# Patient Record
Sex: Male | Born: 1955 | Race: White | Hispanic: No | Marital: Married | Smoking: Former smoker
Health system: Southern US, Community
[De-identification: ages and names within clinical notes are randomized; demographics above are authoritative.]

## PROBLEM LIST (undated history)

## (undated) DIAGNOSIS — T7840XA Allergy, unspecified, initial encounter: Secondary | ICD-10-CM

## (undated) DIAGNOSIS — K219 Gastro-esophageal reflux disease without esophagitis: Secondary | ICD-10-CM

## (undated) DIAGNOSIS — K649 Unspecified hemorrhoids: Secondary | ICD-10-CM

## (undated) DIAGNOSIS — I10 Essential (primary) hypertension: Secondary | ICD-10-CM

## (undated) DIAGNOSIS — Z87442 Personal history of urinary calculi: Secondary | ICD-10-CM

## (undated) DIAGNOSIS — N210 Calculus in bladder: Secondary | ICD-10-CM

## (undated) HISTORY — DX: Unspecified hemorrhoids: K64.9

## (undated) HISTORY — PX: TONSILLECTOMY: SUR1361

## (undated) HISTORY — DX: Allergy, unspecified, initial encounter: T78.40XA

## (undated) HISTORY — PX: HAND SURGERY: SHX662

---

## 2004-08-20 ENCOUNTER — Emergency Department: Payer: Self-pay | Admitting: Emergency Medicine

## 2004-11-10 ENCOUNTER — Encounter: Admission: RE | Admit: 2004-11-10 | Discharge: 2005-02-08 | Payer: Self-pay | Admitting: Occupational Medicine

## 2004-12-09 ENCOUNTER — Ambulatory Visit (HOSPITAL_COMMUNITY): Admission: RE | Admit: 2004-12-09 | Discharge: 2004-12-09 | Payer: Self-pay | Admitting: Orthopedic Surgery

## 2005-01-08 ENCOUNTER — Emergency Department: Payer: Self-pay | Admitting: Emergency Medicine

## 2005-06-07 ENCOUNTER — Emergency Department (HOSPITAL_COMMUNITY): Admission: EM | Admit: 2005-06-07 | Discharge: 2005-06-07 | Payer: Self-pay | Admitting: Family Medicine

## 2006-01-14 ENCOUNTER — Emergency Department (HOSPITAL_COMMUNITY): Admission: EM | Admit: 2006-01-14 | Discharge: 2006-01-14 | Payer: Self-pay | Admitting: Emergency Medicine

## 2007-03-29 ENCOUNTER — Emergency Department: Payer: Self-pay | Admitting: Emergency Medicine

## 2007-04-08 ENCOUNTER — Emergency Department: Payer: Self-pay | Admitting: Emergency Medicine

## 2007-04-10 ENCOUNTER — Emergency Department: Payer: Self-pay | Admitting: Emergency Medicine

## 2007-04-10 ENCOUNTER — Other Ambulatory Visit: Payer: Self-pay

## 2007-04-17 ENCOUNTER — Emergency Department (HOSPITAL_COMMUNITY): Admission: EM | Admit: 2007-04-17 | Discharge: 2007-04-17 | Payer: Self-pay | Admitting: Emergency Medicine

## 2010-06-23 NOTE — Consult Note (Signed)
NAME:  Jonathan Andrews, PAPESH NO.:  [id number]   MEDICAL RECORD NO.:  [id number]          PATIENT TYPE:  EMS   LOCATION:  MAJO                         FACILITY:  [facility]   PHYSICIAN:  Artist Pais. Mina Marble, M.D.DATE OF BIRTH:  24-Apr-1955   DATE OF CONSULTATION:  04/17/2007  DATE OF DISCHARGE:                                 CONSULTATION   PHYSICIAN REQUESTING CONSULTATION:  Carleene Cooper, M.D.   [name] presents today status post a fall.  He is 55 years old, right-  hand dominant.  He presents today with inability to actively extend the  metacarpophalangeal joint of the dominant right index finger.  He is an  otherwise-healthy 55 year old male.   No known drug allergies.   No current medication, recent hospitalizations or surgery.  He does say,  however, about 3 or 4 months ago he cut just proximal to the  metacarpophalangeal joint and has had weakness in extension since that  point in time.  He fell today.   EXAMINATION:  A well-nourished male, pleasant, alert and oriented x3.  Examination of his hand on the left reveals loss of active extension of  the metacarpophalangeal joint.  He can actively flex but extension  beyond 30 degrees is nonexistent.  He had some pain and swelling  proximal to the metacarpophalangeal phalangeal joint.   X-RAYS:  Deferred.   IMPRESSION:  We have a 55 year old man with a ruptured extensor tendon  in the nondominant left index finger.  He is placed in the appropriate  splint and will follow up in my office tomorrow.  We will reexamine him.  If his lack of extension persists, we will explore this and repair his  ruptured tendon.      Artist Pais Mina Marble, M.D.  Electronically Signed     MAW/MEDQ  D:  04/17/2007  T:  04/18/2007  Job:  [id number]

## 2010-12-30 ENCOUNTER — Ambulatory Visit: Payer: Self-pay | Admitting: Gastroenterology

## 2012-01-26 DIAGNOSIS — R972 Elevated prostate specific antigen [PSA]: Secondary | ICD-10-CM | POA: Insufficient documentation

## 2012-06-10 ENCOUNTER — Emergency Department: Payer: Self-pay | Admitting: Emergency Medicine

## 2012-06-10 LAB — URINALYSIS, COMPLETE
Bilirubin,UR: NEGATIVE
Glucose,UR: NEGATIVE mg/dL (ref 0–75)
Ketone: NEGATIVE
Leukocyte Esterase: NEGATIVE
Nitrite: NEGATIVE
Ph: 8 (ref 4.5–8.0)
Protein: 30
RBC,UR: 154 /HPF (ref 0–5)
Specific Gravity: 1.016 (ref 1.003–1.030)
Squamous Epithelial: NONE SEEN
WBC UR: 4 /HPF (ref 0–5)

## 2012-06-10 LAB — CBC
HCT: 44 % (ref 40.0–52.0)
HGB: 15.2 g/dL (ref 13.0–18.0)
MCH: 33.1 pg (ref 26.0–34.0)
MCHC: 34.4 g/dL (ref 32.0–36.0)
MCV: 96 fL (ref 80–100)
Platelet: 202 10*3/uL (ref 150–440)
RBC: 4.58 10*6/uL (ref 4.40–5.90)
RDW: 12.5 % (ref 11.5–14.5)
WBC: 7.3 10*3/uL (ref 3.8–10.6)

## 2012-06-10 LAB — BASIC METABOLIC PANEL
Anion Gap: 6 — ABNORMAL LOW (ref 7–16)
BUN: 20 mg/dL — ABNORMAL HIGH (ref 7–18)
Calcium, Total: 9.8 mg/dL (ref 8.5–10.1)
Chloride: 106 mmol/L (ref 98–107)
Co2: 27 mmol/L (ref 21–32)
Creatinine: 1.55 mg/dL — ABNORMAL HIGH (ref 0.60–1.30)
EGFR (African American): 58 — ABNORMAL LOW
EGFR (Non-African Amer.): 50 — ABNORMAL LOW
Glucose: 112 mg/dL — ABNORMAL HIGH (ref 65–99)
Osmolality: 281 (ref 275–301)
Potassium: 4 mmol/L (ref 3.5–5.1)
Sodium: 139 mmol/L (ref 136–145)

## 2012-07-31 ENCOUNTER — Ambulatory Visit: Payer: Self-pay | Admitting: Urology

## 2012-08-18 ENCOUNTER — Ambulatory Visit: Payer: Self-pay | Admitting: Urology

## 2012-12-23 ENCOUNTER — Emergency Department: Payer: Self-pay | Admitting: Emergency Medicine

## 2013-01-11 ENCOUNTER — Ambulatory Visit: Payer: Self-pay | Admitting: Orthopedic Surgery

## 2013-02-16 ENCOUNTER — Ambulatory Visit: Payer: Self-pay | Admitting: Urology

## 2013-07-30 DIAGNOSIS — Z87442 Personal history of urinary calculi: Secondary | ICD-10-CM | POA: Insufficient documentation

## 2014-05-31 NOTE — Op Note (Signed)
PATIENT NAME:  STYLIANOS, STRADLING MR#:  [id number] DATE OF BIRTH:  1955/03/18  DATE OF PROCEDURE:  01/11/2013  PREOPERATIVE DIAGNOSIS: Left fifth metacarpal fracture, displaced.   POSTOPERATIVE DIAGNOSIS:  Left fifth metacarpal fracture, displaced.   PROCEDURE: Closed reduction pinning, right fifth metacarpal.   ANESTHESIA: General.   SURGEON: Hessie Knows, M.D.   DESCRIPTION OF PROCEDURE: The patient was brought to the Operating Room and after adequate anesthesia was obtained, the right arm was prepped and draped in the usual sterile fashion with a tourniquet applied to the upper arm. After patient identification, timeout procedures were completed, closed manipulation was carried out with the rotation easily corrected. The apex of dorsal angulation was partially corrected. Clinically the hand had good appearance and it was felt that acceptable closed reduction  had been obtained. Two K wires were then inserted, one obliquely through the metacarpal head into the fourth metacarpal. A second more across the more proximal portion of the neck again into the fourth metacarpal, under fluoroscopy the fracture appeared stable. The rotation appeared to be correct on comparison to the other hand with un-scrubbed assistant flexing the fingers. There is no longer abnormal overlap of the fingers. The pins were then cut short and bent over. Xeroform was placed around the base of the pins and a volar splint was applied along 4 x 4's. The patient was sent to the recovery room in stable condition.   ESTIMATED BLOOD LOSS: Minimal.   COMPLICATIONS: None.   SPECIMEN: None.   IMPLANTS: K wire x 2.   ____________________________ Laurene Footman, MD mjm:cc D: 01/11/2013 20:53:47 ET T: 01/11/2013 23:26:11 ET JOB#: [id number]  cc: Laurene Footman, MD, <Dictator> Laurene Footman MD ELECTRONICALLY SIGNED 01/12/2013 1:36

## 2014-08-14 IMAGING — CR RIGHT HAND - 2 VIEW
1 series · 1 of 1 positions shown · non-contrast
Comparison: 12/23/2012

CLINICAL DATA: Postop evaluation

EXAM:
RIGHT HAND - 2 VIEW

[lat]
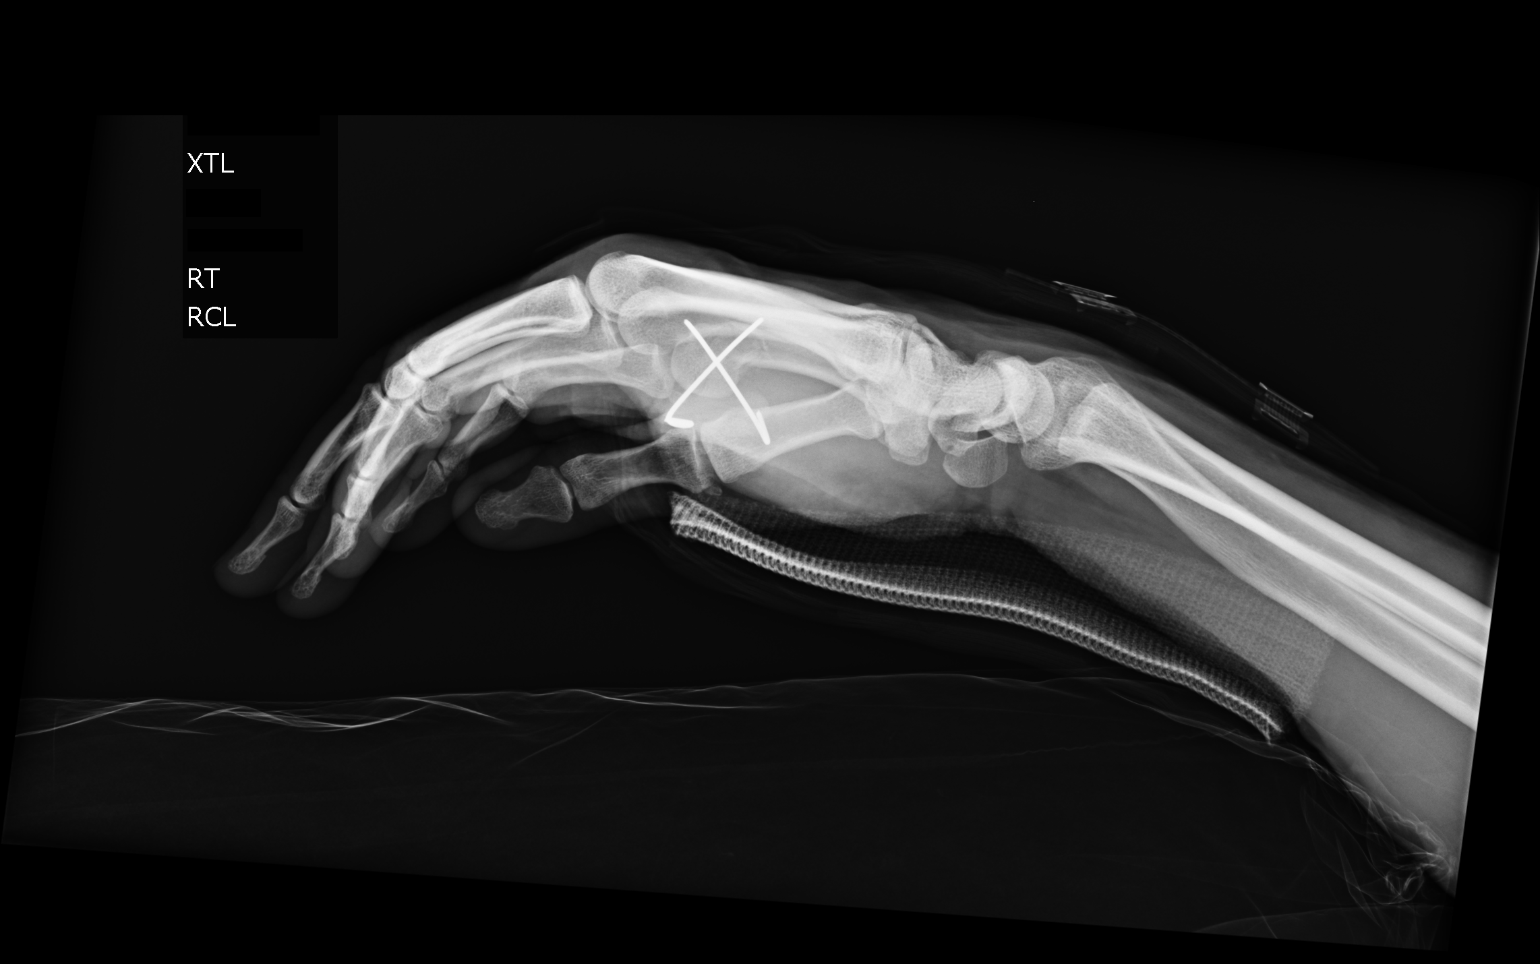

[1 of 1 positions shown; findings below may reference images not displayed]

FINDINGS: Patient status post pinning of the distal 5th metacarpal fracture.
Hardware appears intact without evidence of loosening or failure.
Study is degraded by overlying casting material.
IMPRESSION: Patient is status post open reduction with pending distal 5th
metacarpal fracture.

## 2015-06-30 ENCOUNTER — Ambulatory Visit (INDEPENDENT_AMBULATORY_CARE_PROVIDER_SITE_OTHER): Payer: 59 | Admitting: General Surgery

## 2015-06-30 ENCOUNTER — Encounter: Payer: Self-pay | Admitting: General Surgery

## 2015-06-30 VITALS — BP 122/62 | HR 80 | Resp 12 | Ht 76.0 in | Wt 216.0 lb

## 2015-06-30 DIAGNOSIS — K648 Other hemorrhoids: Secondary | ICD-10-CM | POA: Diagnosis not present

## 2015-06-30 MED ORDER — HYDROCORTISONE ACETATE 25 MG RE SUPP: 25.0000 mg | Freq: Two times a day (BID) | RECTAL | Status: DC

## 2015-06-30 NOTE — Progress Notes (Signed)
Patient ID: Jonathan Blackwater., male   DOB: 10-12-1955, 60 y.o.   MRN: [id number]  Chief Complaint  Patient presents with  . Other    bleeding hemorrhoids    HPI Jonathan E Ranbir Yohey. is a 60 y.o. male here for evaluation of bleeding hemorrhoids. Patient states he has had problems over the last 10 years. He states he has noticed dark red blood on the paper and in the bowl.He has had to make use of a sanitary pad at times. Has used all different kinds of external creams, but has not use suppositories. He states this present episode had the most bleeding that he is experienced. He has had no severe pain since suggesting anal fissure. The patient was recently placed on steroids, and last to-3 days he is reported no bleeding and less perianal discomfort.  The patient normally moves his bowels daily. Occasionally he will have hard stools.  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 These may way up 80 pounds.  The patient is accompanied today by his wife of five years, Jonathan Andrews,  who was present for the interview and exam.  I personally reviewed the patient's history.   HPI  Past Medical History  Diagnosis Date  . Hemorrhoids     Past Surgical History  Procedure Laterality Date  . Hand surgery      No family history on file.  Social History Social History  Substance Use Topics  . Smoking status: Never Smoker   . Smokeless tobacco: Not on file  . Alcohol Use: No    Allergies  Allergen Reactions  . Penicillins Hives    Current Outpatient Prescriptions  Medication Sig Dispense Refill  . hydrocortisone (ANUSOL-HC) 2.5 % rectal cream Apply topically.    . lidocaine (XYLOCAINE) 2 % jelly Apply topically.    . Multiple Vitamin (MULTIVITAMIN) capsule Take by mouth.    . predniSONE (STERAPRED UNI-PAK 21 TAB) 10 MG (21) TBPK tablet     . hydrocortisone (ANUSOL-HC) 25 MG suppository  Place 1 suppository (25 mg total) rectally 2 (two) times daily. 12 suppository 0   No current facility-administered medications for this visit.    Review of Systems Review of Systems  Constitutional: Negative.   Respiratory: Negative.   Cardiovascular: Negative.   Gastrointestinal: Positive for anal bleeding.    Blood pressure 122/62, pulse 80, resp. rate 12, height 6\' 4"  (1.93 m), weight 216 lb (97.977 kg).  Physical Exam Physical Exam  Constitutional: He is oriented to person, place, and time. He appears well-developed and well-nourished.  Eyes: Conjunctivae are normal. No scleral icterus.  Neck: Neck supple.  Cardiovascular: Normal rate, regular rhythm and normal heart sounds.   Pulmonary/Chest: Effort normal and breath sounds normal.  Abdominal: Soft. Normal appearance and bowel sounds are normal. There is no tenderness.  Genitourinary: Rectal exam shows internal hemorrhoid. Rectal exam shows no external hemorrhoid.     External anal exam showed no significant hemorrhoidal disease. No prolapsing tissue with straining in the lateral decubitus position.  Anoscopy showed a large 2 cm nonbleeding internal hemorrhoid on the right anterior wall. Remaining lower rectal exam unremarkable.  Lymphadenopathy:    He has no cervical adenopathy.  Neurological: He is alert and oriented to person, place, and time.  Skin: Skin is warm and dry.  Psychiatric: He has a normal mood and affect.    Data Reviewed  PCP notes of 06/26/2015  reviewed. External hemorrhoids reported with some bleeding. Annotation that this was the worst case it ever seen. Patient was provided with prednisone, Percocet and hydrocortisone cream.   Assessment  Significant hemorrhoidal disease, markedly improved with recent short course of steroids.    Plan    Options for management were reviewed: 1) With marked improved in symptoms and last 2 days continued observation with the initiation of Anusol HC suppositories  to continue to strengthen the generous internal hemorrhoid noted on anoscopy versus 2) elective hemorrhoidectomy.  At this time the patient has been a take a wait-and-see approach. A prescription for Anusol HC suppositories was sent to his pharmacy. Adding fiber to his diet daily to minimize episodic hard stools was encouraged.  If he has recurrent bleeding hemorrhoidectomy would be recommended (the visualized internal hemorrhoid noted today is too large for banding).   If the patient continues to to do well, follow up will be on an as-needed basis.   PCP: [place] This has been scribed by Lesly Rubenstein LPN   Robert Bellow 06/30/2015, 7:50 PM

## 2015-09-18 ENCOUNTER — Ambulatory Visit (INDEPENDENT_AMBULATORY_CARE_PROVIDER_SITE_OTHER): Payer: 59 | Admitting: Urology

## 2015-09-18 ENCOUNTER — Encounter: Payer: Self-pay | Admitting: Urology

## 2015-09-18 VITALS — BP 123/87 | HR 62 | Ht 76.0 in | Wt 216.1 lb

## 2015-09-18 DIAGNOSIS — K219 Gastro-esophageal reflux disease without esophagitis: Secondary | ICD-10-CM | POA: Insufficient documentation

## 2015-09-18 DIAGNOSIS — Z3009 Encounter for other general counseling and advice on contraception: Secondary | ICD-10-CM

## 2015-09-18 MED ORDER — DIAZEPAM 10 MG PO TABS: 10.0000 mg | ORAL_TABLET | Freq: Once | ORAL | 0 refills | Status: AC

## 2015-09-18 NOTE — Progress Notes (Signed)
09/18/2015 8:53 AM   Jonathan Kind Jr. [birth year] [id number]  Referring provider: Luna Glasgow, DO [facility] [place], [place] [address]  Chief Complaint  Patient presents with  . New Patient (Initial Visit)    HPI: The patient is a 60 year old male presents today to discuss vasectomy. Between he and his wife from previous marriages, they have a total of 3 children. They desire no more. Denies any problems at this time. There is no history of hematuria. He does have a past medical history of nephrolithiasis.   PMH: Past Medical History:  Diagnosis Date  . Hemorrhoids   . Kidney stone     Surgical History: Past Surgical History:  Procedure Laterality Date  . HAND SURGERY    . HAND SURGERY Bilateral     Home Medications:    Medication List       Accurate as of 09/18/15  8:53 AM. Always use your most recent med list.          diazepam 10 MG tablet Commonly known as:  VALIUM Take 1 tablet (10 mg total) by mouth once.   hydrocortisone 25 MG suppository Commonly known as:  ANUSOL-HC Place 1 suppository (25 mg total) rectally 2 (two) times daily.   lidocaine 2 % jelly Commonly known as:  XYLOCAINE Apply topically.   multivitamin capsule Take by mouth.   predniSONE 10 MG (21) Tbpk tablet Commonly known as:  STERAPRED UNI-PAK 21 TAB       Allergies:  Allergies  Allergen Reactions  . Penicillins Hives    Family History: Family History  Problem Relation Age of Onset  . Prostate cancer Father   . Bladder Cancer Neg Hx   . Kidney cancer Neg Hx     Social History:  reports that he has quit smoking. His smoking use included Cigarettes. He has never used smokeless tobacco. He reports that he drinks alcohol. He reports that he does not use drugs.  ROS: UROLOGY Frequent Urination?: No Hard to postpone urination?: No Burning/pain with urination?: No Get up at night to urinate?: No Leakage of urine?:  No Urine stream starts and stops?: No Trouble starting stream?: No Do you have to strain to urinate?: No Blood in urine?: No Urinary tract infection?: No Sexually transmitted disease?: No Injury to kidneys or bladder?: No Painful intercourse?: No Weak stream?: No Erection problems?: No Penile pain?: No  Gastrointestinal Nausea?: No Vomiting?: No Indigestion/heartburn?: No Diarrhea?: No Constipation?: No  Constitutional Fever: No Night sweats?: No Weight loss?: No Fatigue?: No  Skin Skin rash/lesions?: No Itching?: No  Eyes Blurred vision?: No Double vision?: No  Ears/Nose/Throat Sore throat?: No Sinus problems?: No  Hematologic/Lymphatic Swollen glands?: No Easy bruising?: No  Cardiovascular Leg swelling?: No Chest pain?: No  Respiratory Cough?: No Shortness of breath?: No  Endocrine Excessive thirst?: No  Musculoskeletal Back pain?: Yes Joint pain?: No  Neurological Headaches?: No Dizziness?: No  Psychologic Depression?: No Anxiety?: No  Physical Exam: BP 123/87 (BP Location: Left Arm, Patient Position: Sitting, Cuff Size: Normal)   Pulse 62   Ht 6\' 4"  (1.93 m)   Wt 216 lb 1.6 oz (98 kg)   BMI 26.30 kg/m   Constitutional:  Alert and oriented, No acute distress. HEENT: [place] AT, moist mucus membranes.  Trachea midline, no masses. Cardiovascular: No clubbing, cyanosis, or edema. Respiratory: Normal respiratory effort, no increased work of breathing. GI: Abdomen is soft, nontender, nondistended, no abdominal masses GU: No CVA  tenderness. Normal phallus. Testicles equally bilaterally. Bilateral vas deferens easily palpable. Skin: No rashes, bruises or suspicious lesions. Lymph: No cervical or inguinal adenopathy. Neurologic: Grossly intact, no focal deficits, moving all 4 extremities. Psychiatric: Normal mood and affect.  Laboratory Data: Lab Results  Component Value Date   WBC 7.3 06/10/2012   HGB 15.2 06/10/2012   HCT 44.0 06/10/2012    MCV 96 06/10/2012   PLT 202 06/10/2012    Lab Results  Component Value Date   CREATININE 1.55 (H) 06/10/2012    No results found for: PSA  No results found for: TESTOSTERONE  No results found for: HGBA1C  Urinalysis    Component Value Date/Time   COLORURINE Yellow 06/10/2012 2152   APPEARANCEUR Hazy 06/10/2012 2152   LABSPEC 1.016 06/10/2012 2152   PHURINE 8.0 06/10/2012 2152   GLUCOSEU Negative 06/10/2012 2152   HGBUR 2+ 06/10/2012 2152   BILIRUBINUR Negative 06/10/2012 2152   KETONESUR Negative 06/10/2012 2152   PROTEINUR 30 mg/dL 06/10/2012 2152   NITRITE Negative 06/10/2012 2152   LEUKOCYTESUR Negative 06/10/2012 2152     Assessment & Plan:    Today, we discussed what the vas deferens is, where it is located, and its function. We reviewed the procedure for vasectomy, it's risks, benefits, alternatives, and likelihood of achieving his goals. We discussed in detail the procedure, complications, and recovery as well as the need for clearance prior to unprotected intercourse. We discussed that vasectomy does not protect against sexually transmitted diseases. We discussed that this procedure does not result in immediate sterility and that they would need to use other forms of birth control until he has been cleared with negative postvasectomy semen analyses. I explained that the procedure is considered to be permanent and that attempts at reversal have varying degrees of success. These options include vasectomy reversal, sperm retrieval, and in vitro fertilization; these can be very expensive. We discussed the chance of postvasectomy pain syndrome which occurs in less than 5% of patients. I explained to the patient that there is no treatment to resolve this chronic pain, and that if it developed I would not be able to help resolve the issue, but that surgery is generally not needed for correction. I explained there have even been reports of systemic like illness associated with  this chronic pain, and that there was no good cure. I explained that vasectomy it is not a 100% reliable form of birth control, and the risk of pregnancy after vasectomy is approximately 1 in 2000 men who had a negative postvasectomy semen analysis or rare non-motile sperm. I explained that repeat vasectomy was necessary in less than 1% of vasectomy procedures when employing the type of technique that I use. I explained that he should refrain from ejaculation for approximately one week following vasectomy. I explained that there are other options for birth control which are permanent and non-permanent; we discussed these. I explained the rates of surgical complications, such as symptomatic hematoma or infection, are low (1-2%) and vary with the surgeon's experience and criteria used to diagnose the complication.  The patient had the opportunity to ask questions to his stated satisfaction. He voiced understanding of the above factors and stated that he has read all the information provided to him and the packets and informed consent  1. Family planning -vasectomy   Return for vasectomy.  Nickie Retort, MD  [facility] [address], [place], [place] [address] [phone]

## 2015-10-20 ENCOUNTER — Ambulatory Visit (INDEPENDENT_AMBULATORY_CARE_PROVIDER_SITE_OTHER): Payer: 59 | Admitting: Urology

## 2015-10-20 ENCOUNTER — Encounter: Payer: Self-pay | Admitting: Urology

## 2015-10-20 VITALS — Ht 76.0 in | Wt 219.4 lb

## 2015-10-20 DIAGNOSIS — Z3009 Encounter for other general counseling and advice on contraception: Secondary | ICD-10-CM

## 2015-10-20 DIAGNOSIS — Z309 Encounter for contraceptive management, unspecified: Secondary | ICD-10-CM | POA: Diagnosis not present

## 2015-10-20 NOTE — Progress Notes (Signed)
Patient returns for vasectomy. I did discuss with him again vasectomy should be considered permanent and we discussed the development of auto sperm antibodies. We also discussed vasectomy does not work immediately and he can take several months for sperm the wash out of the system and he needs to use of the birth control until postvasectomy semen analysis shows no sperm. We also discussed the development of sperm granuloma and post - op restrictions. All questions answered. He elects to proceed. Of note, his wife is 50 years old.  Procedure: After consent was obtained the patient was placed supine and the scrotum prepped and draped in the usual sterile fashion. A timeout was done to confirm the patient and procedure. The left vas deferens was grasped and the skin and vas deferens injected with local anesthetic. A puncture hole was made in the scrotal skin with a sharp hemostat and opened. The ring forceps were used to grasp the vas and it was brought through the incision. The sheath was cleared off the vas and a 1 cm segment excised. The lumne of the abdominal and testicular end was cauterized with a needle point bovie. The abdominal when was dropped back in the sheath and the sheath closed with an interrupted chromic to provide fascial interposition. Hemostasis was excellent. The vas was allowed to drop back in the left hemiscrotum. In a similar fashion and through the same scrotal opening the right vas deferens was grasped and injected with local. It was surrounded with the vas clamp and delivered through the wound. The sheath was cleared and a segment of about 1 cm was excised. The lumen of the abdominal end and the testicular end was cauterized with the needle point bovie and the sheath was closed over each end with an interrupted chromic suture. Hemostasis was again ensured and the vas allowed to drop back in the scrotum. The scrotum was closed with a single interrupted horizontal mattress chromic suture. He  tolerated the procedure well. There were no complications. EBL was minimal.    A/P:  S/p vasectomy - semen analysis in 8 weeks.

## 2015-11-12 ENCOUNTER — Ambulatory Visit
Admission: RE | Admit: 2015-11-12 | Discharge: 2015-11-12 | Disposition: A | Discharge status: Home / Self Care | Payer: 59 | Source: Ambulatory Visit | Attending: Unknown Physician Specialty | Admitting: Unknown Physician Specialty

## 2015-11-12 ENCOUNTER — Other Ambulatory Visit: Payer: Self-pay | Admitting: Unknown Physician Specialty

## 2015-11-12 DIAGNOSIS — R42 Dizziness and giddiness: Secondary | ICD-10-CM | POA: Diagnosis present

## 2015-11-12 DIAGNOSIS — J321 Chronic frontal sinusitis: Secondary | ICD-10-CM | POA: Insufficient documentation

## 2015-11-13 ENCOUNTER — Other Ambulatory Visit: Payer: Self-pay | Admitting: Unknown Physician Specialty

## 2015-11-13 DIAGNOSIS — R42 Dizziness and giddiness: Secondary | ICD-10-CM

## 2015-11-14 ENCOUNTER — Ambulatory Visit: Payer: Self-pay

## 2015-11-17 ENCOUNTER — Ambulatory Visit: Payer: Self-pay

## 2016-02-05 ENCOUNTER — Other Ambulatory Visit: Payer: 59

## 2016-02-05 DIAGNOSIS — Z9852 Vasectomy status: Secondary | ICD-10-CM

## 2016-02-06 LAB — POST-VAS SPERM EVALUATION,QUAL: Volume: 2.6 mL

## 2016-02-12 ENCOUNTER — Telehealth: Payer: Self-pay

## 2016-02-12 NOTE — Telephone Encounter (Signed)
Jonathan Retort, MD  Lestine Box, LPN        Please let patient know there is still sperm in his semen. Continue 2nd form of birth control. He should try to ejaculate more frequently. Repeat semen analysis in 4-6 weeks.    Spoke with pt in reference to semen analysis and birth control. Pt voiced understanding.

## 2016-06-09 ENCOUNTER — Other Ambulatory Visit: Payer: 59

## 2016-06-09 DIAGNOSIS — Z9852 Vasectomy status: Secondary | ICD-10-CM

## 2016-06-10 LAB — POST-VAS SPERM EVALUATION,QUAL: Volume: 2.4 mL

## 2016-06-11 ENCOUNTER — Telehealth: Payer: Self-pay

## 2016-06-11 NOTE — Telephone Encounter (Signed)
Nickie Retort, MD  Lestine Box, LPN        Please let patient know his semen analysis showed no sperm. He can stop using other forms of birth control.    No answer. No vm setup.

## 2016-06-14 NOTE — Telephone Encounter (Signed)
Spoke with pt in reference to semen analysis results. Made aware can stop using birth control. Pt voiced understanding.

## 2017-04-12 ENCOUNTER — Other Ambulatory Visit: Payer: Self-pay

## 2017-04-12 ENCOUNTER — Encounter
Admission: RE | Admit: 2017-04-12 | Discharge: 2017-04-12 | Disposition: A | Discharge status: Home / Self Care | Payer: 59 | Source: Ambulatory Visit | Attending: Orthopedic Surgery | Admitting: Orthopedic Surgery

## 2017-04-12 DIAGNOSIS — M7711 Lateral epicondylitis, right elbow: Secondary | ICD-10-CM | POA: Diagnosis not present

## 2017-04-12 DIAGNOSIS — K219 Gastro-esophageal reflux disease without esophagitis: Secondary | ICD-10-CM | POA: Diagnosis not present

## 2017-04-12 DIAGNOSIS — Z880 Allergy status to penicillin: Secondary | ICD-10-CM | POA: Diagnosis not present

## 2017-04-12 DIAGNOSIS — Z79899 Other long term (current) drug therapy: Secondary | ICD-10-CM | POA: Diagnosis not present

## 2017-04-12 DIAGNOSIS — M7712 Lateral epicondylitis, left elbow: Secondary | ICD-10-CM | POA: Diagnosis not present

## 2017-04-12 HISTORY — DX: Personal history of urinary calculi: Z87.442

## 2017-04-12 NOTE — Patient Instructions (Signed)
Your procedure is scheduled on: Thurs. 04/14/17 Report to Day Surgery. To find out your arrival time please call [phone] between [place] on Wed. 04/23/17.  Remember: Instructions that are not followed completely may result in serious medical risk, up to and including death, or upon the discretion of your surgeon and anesthesiologist your surgery may need to be rescheduled.     _X__ 1. Do not eat food after midnight the night before your procedure.                 No gum chewing or hard candies. You may drink clear liquids up to 2 hours                 before you are scheduled to arrive for your surgery- DO not drink clear                 liquids within 2 hours of the start of your surgery.                 Clear Liquids include:  water, apple juice without pulp, clear carbohydrate                 drink such as Clearfast of Gartorade, Black Coffee or Tea (Do not add                 anything to coffee or tea).  __X__2.  On the morning of surgery brush your teeth with toothpaste and water, you may rinse your mouth with mouthwash if you wish.  Do not swallow any              toothpaste of mouthwash.     _X__ 3.  No Alcohol for 24 hours before or after surgery.   ___ 4.  Do Not Smoke or use e-cigarettes For 24 Hours Prior to Your Surgery.                 Do not use any chewable tobacco products for at least 6 hours prior to                 surgery.  ____  5.  Bring all medications with you on the day of surgery if instructed.   ____  6.  Notify your doctor if there is any change in your medical condition      (cold, fever, infections).     Do not wear jewelry, make-up, hairpins, clips or nail polish. Do not wear lotions, powders, or perfumes. You may wear deodorant. Do not shave 48 hours prior to surgery. Men may shave face and neck. Do not bring valuables to the hospital.    [facility] is not responsible for any belongings or valuables.  Contacts, dentures  or bridgework may not be worn into surgery. Leave your suitcase in the car. After surgery it may be brought to your room. For patients admitted to the hospital, discharge time is determined by your treatment team.   Patients discharged the day of surgery will not be allowed to drive home.   Please read over the following fact sheets that you were given:    _x___ Take these medicines the morning of surgery with A SIP OF WATER:    1.none  2.   3.   4.  5.  6.  ____ Fleet Enema (as directed)   __x__ Use CHG Soap as directed  ____ Use inhalers on the day of surgery  ____ Stop metformin  2 days prior to surgery    ____ Take 1/2 of usual insulin dose the night before surgery. No insulin the morning          of surgery.   ____ Stop Coumadin/Plavix/aspirin on   _x___ Stop Anti-inflammatories on    ____ Stop supplements until after surgery.    ____ Bring C-Pap to the hospital.

## 2017-04-13 MED ORDER — CLINDAMYCIN PHOSPHATE 900 MG/50ML IV SOLN
900.0000 mg | Freq: Once | INTRAVENOUS | Status: AC
  Administered 2017-04-14: 900 mg via INTRAVENOUS

## 2017-04-14 ENCOUNTER — Encounter: Payer: Self-pay | Admitting: *Deleted

## 2017-04-14 ENCOUNTER — Other Ambulatory Visit: Payer: Self-pay

## 2017-04-14 ENCOUNTER — Ambulatory Visit: Payer: 59 | Admitting: Anesthesiology

## 2017-04-14 ENCOUNTER — Encounter
Admission: RE | Disposition: A | Discharge status: Home / Self Care | Payer: Self-pay | Source: Ambulatory Visit | Attending: Orthopedic Surgery

## 2017-04-14 ENCOUNTER — Ambulatory Visit
Admission: RE | Admit: 2017-04-14 | Discharge: 2017-04-14 | Disposition: A | Discharge status: Home / Self Care | Payer: 59 | Source: Ambulatory Visit | Attending: Orthopedic Surgery | Admitting: Orthopedic Surgery

## 2017-04-14 DIAGNOSIS — M7712 Lateral epicondylitis, left elbow: Secondary | ICD-10-CM | POA: Diagnosis not present

## 2017-04-14 DIAGNOSIS — Z880 Allergy status to penicillin: Secondary | ICD-10-CM | POA: Insufficient documentation

## 2017-04-14 DIAGNOSIS — K219 Gastro-esophageal reflux disease without esophagitis: Secondary | ICD-10-CM | POA: Insufficient documentation

## 2017-04-14 DIAGNOSIS — Z79899 Other long term (current) drug therapy: Secondary | ICD-10-CM | POA: Insufficient documentation

## 2017-04-14 DIAGNOSIS — M7711 Lateral epicondylitis, right elbow: Secondary | ICD-10-CM | POA: Insufficient documentation

## 2017-04-14 HISTORY — PX: TENNIS ELBOW RELEASE/NIRSCHEL PROCEDURE: SHX6651

## 2017-04-14 LAB — GLUCOSE, CAPILLARY: GLUCOSE-CAPILLARY: 79 mg/dL (ref 65–99)

## 2017-04-14 SURGERY — TENNIS ELBOW RELEASE/NIRSCHEL PROCEDURE
Anesthesia: General | Site: Elbow | Laterality: Bilateral | Wound class: Clean

## 2017-04-14 MED ORDER — CLINDAMYCIN PHOSPHATE 900 MG/50ML IV SOLN
INTRAVENOUS | Status: AC
  Filled 2017-04-14: qty 50

## 2017-04-14 MED ORDER — LACTATED RINGERS IV SOLN
INTRAVENOUS | Status: DC
  Administered 2017-04-14: 15:00:00 via INTRAVENOUS

## 2017-04-14 MED ORDER — FENTANYL CITRATE (PF) 100 MCG/2ML IJ SOLN
INTRAMUSCULAR | Status: AC
  Administered 2017-04-14: 25 ug via INTRAVENOUS
  Filled 2017-04-14: qty 2

## 2017-04-14 MED ORDER — DEXAMETHASONE SODIUM PHOSPHATE 10 MG/ML IJ SOLN
INTRAMUSCULAR | Status: DC | PRN
  Administered 2017-04-14: 5 mg via INTRAVENOUS

## 2017-04-14 MED ORDER — BUPIVACAINE HCL (PF) 0.5 % IJ SOLN
INTRAMUSCULAR | Status: AC
  Filled 2017-04-14: qty 30

## 2017-04-14 MED ORDER — FENTANYL CITRATE (PF) 100 MCG/2ML IJ SOLN
25.0000 ug | INTRAMUSCULAR | Status: AC | PRN
  Administered 2017-04-14 (×6): 25 ug via INTRAVENOUS

## 2017-04-14 MED ORDER — MIDAZOLAM HCL 5 MG/5ML IJ SOLN
INTRAMUSCULAR | Status: DC | PRN
  Administered 2017-04-14 (×2): 2 mg via INTRAVENOUS

## 2017-04-14 MED ORDER — FENTANYL CITRATE (PF) 100 MCG/2ML IJ SOLN
INTRAMUSCULAR | Status: AC
  Filled 2017-04-14: qty 2

## 2017-04-14 MED ORDER — ONDANSETRON HCL 4 MG/2ML IJ SOLN
INTRAMUSCULAR | Status: AC
  Filled 2017-04-14: qty 2

## 2017-04-14 MED ORDER — LIDOCAINE HCL (CARDIAC) 20 MG/ML IV SOLN
INTRAVENOUS | Status: DC | PRN
  Administered 2017-04-14: 100 mg via INTRAVENOUS

## 2017-04-14 MED ORDER — MIDAZOLAM HCL 2 MG/2ML IJ SOLN
INTRAMUSCULAR | Status: AC
  Filled 2017-04-14: qty 2

## 2017-04-14 MED ORDER — BUPIVACAINE HCL (PF) 0.5 % IJ SOLN
INTRAMUSCULAR | Status: DC | PRN
  Administered 2017-04-14 (×2): 10 mL

## 2017-04-14 MED ORDER — NEOMYCIN-POLYMYXIN B GU 40-200000 IR SOLN
Status: DC | PRN
  Administered 2017-04-14: 2 mL

## 2017-04-14 MED ORDER — PROPOFOL 10 MG/ML IV BOLUS
INTRAVENOUS | Status: DC | PRN
  Administered 2017-04-14: 180 mg via INTRAVENOUS

## 2017-04-14 MED ORDER — PROMETHAZINE HCL 25 MG/ML IJ SOLN: 6.2500 mg | INTRAMUSCULAR | Status: DC | PRN

## 2017-04-14 MED ORDER — KETOROLAC TROMETHAMINE 30 MG/ML IJ SOLN
INTRAMUSCULAR | Status: DC | PRN
  Administered 2017-04-14: 30 mg via INTRAVENOUS

## 2017-04-14 MED ORDER — GLYCOPYRROLATE 0.2 MG/ML IJ SOLN
INTRAMUSCULAR | Status: DC | PRN
  Administered 2017-04-14: 0.2 mg via INTRAVENOUS

## 2017-04-14 MED ORDER — KETOROLAC TROMETHAMINE 30 MG/ML IJ SOLN
INTRAMUSCULAR | Status: AC
  Filled 2017-04-14: qty 1

## 2017-04-14 MED ORDER — PROPOFOL 10 MG/ML IV BOLUS
INTRAVENOUS | Status: AC
  Filled 2017-04-14: qty 20

## 2017-04-14 MED ORDER — DEXAMETHASONE SODIUM PHOSPHATE 10 MG/ML IJ SOLN
INTRAMUSCULAR | Status: AC
  Filled 2017-04-14: qty 1

## 2017-04-14 MED ORDER — FENTANYL CITRATE (PF) 100 MCG/2ML IJ SOLN
INTRAMUSCULAR | Status: DC | PRN
  Administered 2017-04-14: 25 ug via INTRAVENOUS
  Administered 2017-04-14 (×2): 50 ug via INTRAVENOUS

## 2017-04-14 MED ORDER — NEOMYCIN-POLYMYXIN B GU 40-200000 IR SOLN
Status: AC
  Filled 2017-04-14: qty 2

## 2017-04-14 MED ORDER — ACETAMINOPHEN 10 MG/ML IV SOLN
INTRAVENOUS | Status: DC | PRN
  Administered 2017-04-14: 1000 mg via INTRAVENOUS

## 2017-04-14 MED ORDER — FAMOTIDINE 20 MG PO TABS
ORAL_TABLET | ORAL | Status: AC
  Administered 2017-04-14: 20 mg via ORAL
  Filled 2017-04-14: qty 1

## 2017-04-14 MED ORDER — HYDROCODONE-ACETAMINOPHEN 5-325 MG PO TABS: 1.0000 | ORAL_TABLET | Freq: Four times a day (QID) | ORAL | 0 refills | Status: DC | PRN

## 2017-04-14 MED ORDER — ONDANSETRON HCL 4 MG/2ML IJ SOLN
INTRAMUSCULAR | Status: DC | PRN
  Administered 2017-04-14: 4 mg via INTRAVENOUS

## 2017-04-14 MED ORDER — FAMOTIDINE 20 MG PO TABS
20.0000 mg | ORAL_TABLET | Freq: Once | ORAL | Status: AC
  Administered 2017-04-14: 20 mg via ORAL

## 2017-04-14 MED ORDER — SEVOFLURANE IN SOLN
RESPIRATORY_TRACT | Status: AC
  Filled 2017-04-14: qty 250

## 2017-04-14 MED ORDER — ACETAMINOPHEN 10 MG/ML IV SOLN
INTRAVENOUS | Status: AC
  Filled 2017-04-14: qty 100

## 2017-04-14 SURGICAL SUPPLY — 33 items
BANDAGE ACE 4X5 VEL STRL LF (GAUZE/BANDAGES/DRESSINGS) ×6 IMPLANT
CANISTER SUCT 1200ML W/VALVE (MISCELLANEOUS) ×3 IMPLANT
CHLORAPREP W/TINT 26ML (MISCELLANEOUS) ×9 IMPLANT
CLOSURE WOUND 1/2 X4 (GAUZE/BANDAGES/DRESSINGS) ×2
CUFF TOURN 18 STER (MISCELLANEOUS) ×6 IMPLANT
CUFF TOURN 24 STER (MISCELLANEOUS) IMPLANT
ELECT CAUTERY BLADE 6.4 (BLADE) ×3 IMPLANT
ELECT REM PT RETURN 9FT ADLT (ELECTROSURGICAL) ×3
ELECTRODE REM PT RTRN 9FT ADLT (ELECTROSURGICAL) ×1 IMPLANT
GAUZE PETRO XEROFOAM 1X8 (MISCELLANEOUS) ×6 IMPLANT
GAUZE SPONGE 4X4 12PLY STRL (GAUZE/BANDAGES/DRESSINGS) ×3 IMPLANT
GLOVE SURG SYN 9.0  PF PI (GLOVE) ×2
GLOVE SURG SYN 9.0 PF PI (GLOVE) ×1 IMPLANT
GOWN SRG 2XL LVL 4 RGLN SLV (GOWNS) ×1 IMPLANT
GOWN STRL NON-REIN 2XL LVL4 (GOWNS) ×2
GOWN STRL REUS W/ TWL LRG LVL3 (GOWN DISPOSABLE) ×1 IMPLANT
GOWN STRL REUS W/TWL LRG LVL3 (GOWN DISPOSABLE) ×2
KIT TURNOVER KIT A (KITS) ×3 IMPLANT
NEEDLE FILTER BLUNT 18X 1/2SAF (NEEDLE) ×2
NEEDLE FILTER BLUNT 18X1 1/2 (NEEDLE) ×1 IMPLANT
NS IRRIG 500ML POUR BTL (IV SOLUTION) ×3 IMPLANT
PACK EXTREMITY ARMC (MISCELLANEOUS) ×3 IMPLANT
PAD ABD DERMACEA PRESS 5X9 (GAUZE/BANDAGES/DRESSINGS) ×6 IMPLANT
PAD CAST CTTN 4X4 STRL (SOFTGOODS) ×2 IMPLANT
PADDING CAST COTTON 4X4 STRL (SOFTGOODS) ×4
SCALPEL PROTECTED #15 DISP (BLADE) ×6 IMPLANT
SPONGE LAP 18X18 5 PK (GAUZE/BANDAGES/DRESSINGS) ×3 IMPLANT
STRIP CLOSURE SKIN 1/2X4 (GAUZE/BANDAGES/DRESSINGS) ×4 IMPLANT
SUT ETHILON NAB PS2 4-0 18IN (SUTURE) ×6 IMPLANT
SUT VIC AB 1 CT1 36 (SUTURE) ×3 IMPLANT
SUT VIC AB 2-0 SH 27 (SUTURE) ×2
SUT VIC AB 2-0 SH 27XBRD (SUTURE) ×1 IMPLANT
SUT VIC AB 3-0 PS2 18 (SUTURE) ×3 IMPLANT

## 2017-04-14 NOTE — Op Note (Signed)
04/14/2017  5:22 PM  PATIENT:  Jonathan Andrews.  62 y.o. male  PRE-OPERATIVE DIAGNOSIS:  LATERAL EPICONDYLITIS BILATERAL ELBOWS  POST-OPERATIVE DIAGNOSIS:  LATERAL EPICONDYLITIS BILATERAL ELBOWS  PROCEDURE:  Procedure(s): TENNIS ELBOW RELEASE/NIRSCHEL PROCEDURE (Bilateral)  SURGEON: Laurene Footman, MD  ASSISTANTS: none  ANESTHESIA:   general  EBL:  No intake/output data recorded.  BLOOD ADMINISTERED:none  DRAINS: none   LOCAL MEDICATIONS USED:  MARCAINE     SPECIMEN:  No Specimen  DISPOSITION OF SPECIMEN:  N/A  COUNTS:  YES  TOURNIQUET:   Total Tourniquet Time Documented: Upper Arm (Right) - 18 minutes Upper Arm (Right) - 17 minutes Total: Upper Arm (Right) - 35 minutes   IMPLANTS: none  DICTATION: .Dragon Dictation patient was brought to the operating room and after adequate general anesthesia was obtained the right arm was prepped and draped in usual sterile fashion with a tourniquet applied to the upper arm.  After patient identification and timeout procedure was completed tourniquet was raised to 250 mmHg.  Curvilinear incision was made based on the lateral epicondyle and skin and subcutaneous tissue were divided to expose the extent, extension to origin there was an area of inflammation is quite visible this was incised and tissue was debrided that was clearly chronic tendinosis.  After debriding this back to healthy tissue using the scratch test the lateral epicondyle was debrided until bleeding bone was noted the wound was irrigated and then the deep fascia repaired using 0 Vicryl.  10 cc of half percent Sensorcaine without epinephrine was infiltrated around the area of the incision and into the muscle.  Subtenons tissue closed with 3-0 Vicryl followed by 4-0 nylon for the skin.  Xeroform 4 x 4 web roll and Ace wrap applied.  The identical procedure was then carried out to the left elbow where there was quite a bit less inflamed inflammatory tissue present and it  did not extend into the joint as it did on the right after debriding the epicondyle to bleeding bone on the left side the fascia was again repaired with a heavy Vicryl followed by 3-0 Vicryl subcutaneously and 4-0 nylon for the skin with local anesthetic also having been injected.  There is no specimen and no complications Xeroform 4 x 4's web roll and Ace wrap applied to the left arm and tourniquet let down tourniquet time was 17 minutes on the left 18 minutes on the right  PLAN OF CARE: Discharge to home after PACU  PATIENT DISPOSITION:  PACU - hemodynamically stable.

## 2017-04-14 NOTE — Anesthesia Postprocedure Evaluation (Signed)
Anesthesia Post Note  Patient: Jonathan Andrews.  Procedure(s) Performed: TENNIS ELBOW RELEASE/NIRSCHEL PROCEDURE (Bilateral Elbow)  Patient location during evaluation: PACU Anesthesia Type: General Level of consciousness: awake and alert Pain management: pain level controlled Vital Signs Assessment: post-procedure vital signs reviewed and stable Respiratory status: spontaneous breathing and respiratory function stable Cardiovascular status: stable Anesthetic complications: no     Last Vitals:  Vitals:   04/14/17 1747 04/14/17 1752  BP:  (!) 163/98  Pulse: 82 77  Resp: 17 14  Temp:    SpO2: 100% 100%    Last Pain:  Vitals:   04/14/17 1752  TempSrc:   PainSc: 5                  KEPHART,WILLIAM K

## 2017-04-14 NOTE — Anesthesia Post-op Follow-up Note (Signed)
Anesthesia QCDR form completed.        

## 2017-04-14 NOTE — Anesthesia Procedure Notes (Signed)
Procedure Name: LMA Insertion Date/Time: 04/14/2017 4:11 PM Performed by: Dionne Bucy, CRNA Pre-anesthesia Checklist: Patient identified, Patient being monitored, Timeout performed, Emergency Drugs available and Suction available Patient Re-evaluated:Patient Re-evaluated prior to induction Oxygen Delivery Method: Circle system utilized Preoxygenation: Pre-oxygenation with 100% oxygen Induction Type: IV induction Ventilation: Mask ventilation without difficulty LMA: LMA inserted LMA Size: 4.5 Tube type: Oral Number of attempts: 1 Placement Confirmation: positive ETCO2 and breath sounds checked- equal and bilateral Tube secured with: Tape Dental Injury: Teeth and Oropharynx as per pre-operative assessment

## 2017-04-14 NOTE — Transfer of Care (Signed)
Immediate Anesthesia Transfer of Care Note  Patient: Jonathan Andrews.  Procedure(s) Performed: TENNIS ELBOW RELEASE/NIRSCHEL PROCEDURE (Bilateral Elbow)  Patient Location: PACU  Anesthesia Type:General  Level of Consciousness: sedated  Airway & Oxygen Therapy: Patient Spontanous Breathing and Patient connected to face mask oxygen  Post-op Assessment: Report given to RN and Post -op Vital signs reviewed and stable  Post vital signs: Reviewed and stable  Last Vitals:  Vitals:   04/14/17 1403 04/14/17 1722  BP: 134/86 (!) 158/89  Pulse: 65 86  Resp: 16 16  Temp: (!) 36.4 C 36.5 C  SpO2: 100% 99%    Last Pain:  Vitals:   04/14/17 1403  TempSrc: Tympanic  PainSc: 1          Complications: No apparent anesthesia complications

## 2017-04-14 NOTE — H&P (Signed)
Reviewed paper H+P, will be scanned into chart. No changes noted.  

## 2017-04-14 NOTE — Discharge Instructions (Addendum)
AMBULATORY SURGERY  DISCHARGE INSTRUCTIONS   1) The drugs that you were given will stay in your system until tomorrow so for the next 24 hours you should not:  A) Drive an automobile B) Make any legal decisions C) Drink any alcoholic beverage   2) You may resume regular meals tomorrow.  Today it is better to start with liquids and gradually work up to solid foods.  You may eat anything you prefer, but it is better to start with liquids, then soup and crackers, and gradually work up to solid foods.   3) Please notify your doctor immediately if you have any unusual bleeding, trouble breathing, redness and pain at the surgery site, drainage, fever, or pain not relieved by medication. 4)   5) Your post-operative visit with Dr.                                     is: Date:                        Time:    Please call to schedule your post-operative visit.  6) Additional Instructions:      Loosen Ace wrap if forearm swell otherwise leave dressings in place.  No lifting over 3 pounds.  Pain medicine as directed.  Okay to ice the elbow through the weekend

## 2017-04-14 NOTE — Anesthesia Preprocedure Evaluation (Signed)
Anesthesia Evaluation  Patient identified by MRN, date of birth, ID band Patient awake    Reviewed: Allergy & Precautions, H&P , NPO status , Patient's Chart, lab work & pertinent test results, reviewed documented beta blocker date and time   History of Anesthesia Complications Negative for: history of anesthetic complications  Airway Mallampati: II  TM Distance: >3 FB Neck ROM: full    Dental  (+) Caps, Dental Advidsory Given, Missing, Teeth Intact   Pulmonary neg pulmonary ROS, former smoker,           Cardiovascular Exercise Tolerance: Good negative cardio ROS       Neuro/Psych negative neurological ROS  negative psych ROS   GI/Hepatic negative GI ROS, Neg liver ROS,   Endo/Other  negative endocrine ROS  Renal/GU Renal disease (kidney stones)  negative genitourinary   Musculoskeletal   Abdominal   Peds  Hematology negative hematology ROS (+)   Anesthesia Other Findings Past Medical History: No date: Hemorrhoids No date: History of kidney stones   Reproductive/Obstetrics negative OB ROS                             Anesthesia Physical  Anesthesia Plan  ASA: I  Anesthesia Plan: General   Post-op Pain Management:    Induction: Intravenous  PONV Risk Score and Plan: 2 and Ondansetron and Dexamethasone  Airway Management Planned: LMA  Additional Equipment:   Intra-op Plan:   Post-operative Plan: Extubation in OR  Informed Consent: I have reviewed the patients History and Physical, chart, labs and discussed the procedure including the risks, benefits and alternatives for the proposed anesthesia with the patient or authorized representative who has indicated his/her understanding and acceptance.   Dental Advisory Given  Plan Discussed with: Anesthesiologist, CRNA and Surgeon  Anesthesia Plan Comments:         Anesthesia Quick Evaluation  

## 2017-04-15 ENCOUNTER — Encounter: Payer: Self-pay | Admitting: Orthopedic Surgery

## 2017-12-29 ENCOUNTER — Encounter
Admission: RE | Admit: 2017-12-29 | Discharge: 2017-12-29 | Disposition: A | Discharge status: Home / Self Care | Source: Ambulatory Visit | Attending: Orthopedic Surgery | Admitting: Orthopedic Surgery

## 2017-12-29 ENCOUNTER — Other Ambulatory Visit: Payer: Self-pay

## 2017-12-29 HISTORY — DX: Gastro-esophageal reflux disease without esophagitis: K21.9

## 2017-12-29 NOTE — Patient Instructions (Signed)
Your procedure is scheduled on: 01-03-18  Report to Same Day Surgery 2nd floor medical mall [facility] Entrance-take elevator on left to 2nd floor.  Check in with surgery information desk.) To find out your arrival time please call [phone] between 1PM - 3PM on 01-02-18  Remember: Instructions that are not followed completely may result in serious medical risk, up to and including death, or upon the discretion of your surgeon and anesthesiologist your surgery may need to be rescheduled.    _x___ 1. Do not eat food after midnight the night before your procedure. You may drink clear liquids up to 2 hours before you are scheduled to arrive at the hospital for your procedure.  Do not drink clear liquids within 2 hours of your scheduled arrival to the hospital.  Clear liquids include  --Water or Apple juice without pulp  --Clear carbohydrate beverage such as ClearFast or Gatorade  --Black Coffee or Clear Tea (No milk, no creamers, do not add anything to  the coffee or Tea   ____Ensure clear carbohydrate drink on the way to the hospital for bariatric patients  ____Ensure clear carbohydrate drink 3 hours before surgery for Dr Dwyane Luo patients if physician instructed.   No gum chewing or hard candies.     __x__ 2. No Alcohol for 24 hours before or after surgery.   __x__3. No Smoking or e-cigarettes for 24 prior to surgery.  Do not use any chewable tobacco products for at least 6 hour prior to surgery   ____  4. Bring all medications with you on the day of surgery if instructed.    __x__ 5. Notify your doctor if there is any change in your medical condition     (cold, fever, infections).    x___6. On the morning of surgery brush your teeth with toothpaste and water.  You may rinse your mouth with mouth wash if you wish.  Do not swallow any toothpaste or mouthwash.   Do not wear jewelry, make-up, hairpins, clips or nail polish.  Do not wear lotions, powders, or perfumes. You may  wear deodorant.  Do not shave 48 hours prior to surgery. Men may shave face and neck.  Do not bring valuables to the hospital.    [facility] is not responsible for any belongings or valuables.               Contacts, dentures or bridgework may not be worn into surgery.  Leave your suitcase in the car. After surgery it may be brought to your room.  For patients admitted to the hospital, discharge time is determined by your treatment team.  _  Patients discharged the day of surgery will not be allowed to drive home.  You will need someone to drive you home and stay with you the night of your procedure.    Please read over the following fact sheets that you were given:   [facility] Preparing for Surgery  _x___ TAKE THE FOLLOWING MEDICATION THE MORNING OF SURGERY WITH A SMALL SIP OF WATER. These include:  1. [place]  2. TAKE A NEXIUM THE NIGHT BEFORE YOUR SURGERY  3.  4.  5.  6.  ____Fleets enema or Magnesium Citrate as directed.   ____ Use CHG Soap or sage wipes as directed on instruction sheet   ____ Use inhalers on the day of surgery and bring to hospital day of surgery  ____ Stop Metformin and Janumet 2 days prior to surgery.    ____ Take  1/2 of usual insulin dose the night before surgery and none on the morning surgery.   ____ Follow recommendations from Cardiologist, Pulmonologist or PCP regarding stopping Aspirin, Coumadin, Plavix ,Eliquis, Effient, or Pradaxa, and Pletal.  X____Stop Anti-inflammatories such as Advil, Aleve, Ibuprofen, Motrin, Naproxen, RELAFEN, Naprosyn, Goodies powders or aspirin products NOW-OK to take Tylenol    _x___ Stop supplements until after surgery-STOP YOUR SAW PALMETTO NOW   ____ Bring C-Pap to the hospital.

## 2018-01-02 MED ORDER — CLINDAMYCIN PHOSPHATE 900 MG/50ML IV SOLN
900.0000 mg | Freq: Once | INTRAVENOUS | Status: AC
  Administered 2018-01-03: 900 mg via INTRAVENOUS

## 2018-01-03 ENCOUNTER — Other Ambulatory Visit: Payer: Self-pay

## 2018-01-03 ENCOUNTER — Ambulatory Visit: Payer: 59 | Admitting: Anesthesiology

## 2018-01-03 ENCOUNTER — Encounter
Admission: RE | Disposition: A | Discharge status: Home / Self Care | Payer: Self-pay | Source: Ambulatory Visit | Attending: Orthopedic Surgery

## 2018-01-03 ENCOUNTER — Ambulatory Visit
Admission: RE | Admit: 2018-01-03 | Discharge: 2018-01-03 | Disposition: A | Discharge status: Home / Self Care | Payer: 59 | Source: Ambulatory Visit | Attending: Orthopedic Surgery | Admitting: Orthopedic Surgery

## 2018-01-03 ENCOUNTER — Encounter: Payer: Self-pay | Admitting: *Deleted

## 2018-01-03 DIAGNOSIS — Z8042 Family history of malignant neoplasm of prostate: Secondary | ICD-10-CM | POA: Diagnosis not present

## 2018-01-03 DIAGNOSIS — Z79899 Other long term (current) drug therapy: Secondary | ICD-10-CM | POA: Insufficient documentation

## 2018-01-03 DIAGNOSIS — Z800 Family history of malignant neoplasm of digestive organs: Secondary | ICD-10-CM | POA: Insufficient documentation

## 2018-01-03 DIAGNOSIS — Z880 Allergy status to penicillin: Secondary | ICD-10-CM | POA: Diagnosis not present

## 2018-01-03 DIAGNOSIS — Z87442 Personal history of urinary calculi: Secondary | ICD-10-CM | POA: Insufficient documentation

## 2018-01-03 DIAGNOSIS — Z87891 Personal history of nicotine dependence: Secondary | ICD-10-CM | POA: Diagnosis not present

## 2018-01-03 DIAGNOSIS — M7711 Lateral epicondylitis, right elbow: Secondary | ICD-10-CM | POA: Diagnosis not present

## 2018-01-03 DIAGNOSIS — K219 Gastro-esophageal reflux disease without esophagitis: Secondary | ICD-10-CM | POA: Insufficient documentation

## 2018-01-03 HISTORY — PX: TENNIS ELBOW RELEASE/NIRSCHEL PROCEDURE: SHX6651

## 2018-01-03 SURGERY — TENNIS ELBOW RELEASE/NIRSCHEL PROCEDURE
Anesthesia: General | Laterality: Right

## 2018-01-03 MED ORDER — NEOMYCIN-POLYMYXIN B GU 40-200000 IR SOLN
Status: DC | PRN
  Administered 2018-01-03: 2 mL

## 2018-01-03 MED ORDER — KETOROLAC TROMETHAMINE 30 MG/ML IJ SOLN
INTRAMUSCULAR | Status: DC | PRN
  Administered 2018-01-03: 30 mg via INTRAVENOUS

## 2018-01-03 MED ORDER — DEXAMETHASONE SODIUM PHOSPHATE 10 MG/ML IJ SOLN
INTRAMUSCULAR | Status: DC | PRN
  Administered 2018-01-03: 5 mg via INTRAVENOUS

## 2018-01-03 MED ORDER — FENTANYL CITRATE (PF) 100 MCG/2ML IJ SOLN
25.0000 ug | INTRAMUSCULAR | Status: AC | PRN
  Administered 2018-01-03 (×6): 25 ug via INTRAVENOUS

## 2018-01-03 MED ORDER — MIDAZOLAM HCL 2 MG/2ML IJ SOLN
INTRAMUSCULAR | Status: AC
  Filled 2018-01-03: qty 2

## 2018-01-03 MED ORDER — PROPOFOL 10 MG/ML IV BOLUS
INTRAVENOUS | Status: DC | PRN
  Administered 2018-01-03: 180 mg via INTRAVENOUS

## 2018-01-03 MED ORDER — CLINDAMYCIN PHOSPHATE 900 MG/50ML IV SOLN
INTRAVENOUS | Status: AC
  Filled 2018-01-03: qty 50

## 2018-01-03 MED ORDER — ACETAMINOPHEN 10 MG/ML IV SOLN
INTRAVENOUS | Status: DC | PRN
  Administered 2018-01-03: 1000 mg via INTRAVENOUS

## 2018-01-03 MED ORDER — DEXAMETHASONE SODIUM PHOSPHATE 10 MG/ML IJ SOLN
INTRAMUSCULAR | Status: AC
  Filled 2018-01-03: qty 1

## 2018-01-03 MED ORDER — HYDROCODONE-ACETAMINOPHEN 5-325 MG PO TABS
ORAL_TABLET | ORAL | Status: AC
  Administered 2018-01-03: 1 via ORAL
  Filled 2018-01-03: qty 1

## 2018-01-03 MED ORDER — LIDOCAINE HCL (CARDIAC) PF 100 MG/5ML IV SOSY
PREFILLED_SYRINGE | INTRAVENOUS | Status: DC | PRN
  Administered 2018-01-03: 60 mg via INTRAVENOUS

## 2018-01-03 MED ORDER — MIDAZOLAM HCL 5 MG/5ML IJ SOLN
INTRAMUSCULAR | Status: DC | PRN
  Administered 2018-01-03: 2 mg via INTRAVENOUS

## 2018-01-03 MED ORDER — BUPIVACAINE HCL (PF) 0.5 % IJ SOLN
INTRAMUSCULAR | Status: DC | PRN
  Administered 2018-01-03: 10 mL

## 2018-01-03 MED ORDER — FENTANYL CITRATE (PF) 100 MCG/2ML IJ SOLN
INTRAMUSCULAR | Status: AC
  Administered 2018-01-03: 25 ug via INTRAVENOUS
  Filled 2018-01-03: qty 2

## 2018-01-03 MED ORDER — KETOROLAC TROMETHAMINE 30 MG/ML IJ SOLN
INTRAMUSCULAR | Status: AC
  Filled 2018-01-03: qty 1

## 2018-01-03 MED ORDER — ONDANSETRON HCL 4 MG/2ML IJ SOLN
INTRAMUSCULAR | Status: AC
  Filled 2018-01-03: qty 2

## 2018-01-03 MED ORDER — BUPIVACAINE HCL (PF) 0.5 % IJ SOLN
INTRAMUSCULAR | Status: AC
  Filled 2018-01-03: qty 30

## 2018-01-03 MED ORDER — PROPOFOL 10 MG/ML IV BOLUS
INTRAVENOUS | Status: AC
  Filled 2018-01-03: qty 20

## 2018-01-03 MED ORDER — FENTANYL CITRATE (PF) 100 MCG/2ML IJ SOLN
INTRAMUSCULAR | Status: DC | PRN
  Administered 2018-01-03 (×4): 25 ug via INTRAVENOUS

## 2018-01-03 MED ORDER — NEOMYCIN-POLYMYXIN B GU 40-200000 IR SOLN
Status: AC
  Filled 2018-01-03: qty 20

## 2018-01-03 MED ORDER — LIDOCAINE HCL (PF) 2 % IJ SOLN
INTRAMUSCULAR | Status: AC
  Filled 2018-01-03: qty 10

## 2018-01-03 MED ORDER — FENTANYL CITRATE (PF) 100 MCG/2ML IJ SOLN
INTRAMUSCULAR | Status: AC
  Filled 2018-01-03: qty 2

## 2018-01-03 MED ORDER — ONDANSETRON HCL 4 MG/2ML IJ SOLN: 4.0000 mg | Freq: Once | INTRAMUSCULAR | Status: DC | PRN

## 2018-01-03 MED ORDER — ONDANSETRON HCL 4 MG/2ML IJ SOLN
INTRAMUSCULAR | Status: DC | PRN
  Administered 2018-01-03: 4 mg via INTRAVENOUS

## 2018-01-03 MED ORDER — ACETAMINOPHEN 10 MG/ML IV SOLN
INTRAVENOUS | Status: AC
  Filled 2018-01-03: qty 100

## 2018-01-03 MED ORDER — LACTATED RINGERS IV SOLN
INTRAVENOUS | Status: DC
  Administered 2018-01-03 (×2): via INTRAVENOUS

## 2018-01-03 MED ORDER — HYDROCODONE-ACETAMINOPHEN 5-325 MG PO TABS
1.0000 | ORAL_TABLET | ORAL | Status: DC | PRN
  Administered 2018-01-03: 1 via ORAL

## 2018-01-03 MED ORDER — HYDROCODONE-ACETAMINOPHEN 5-325 MG PO TABS: 1.0000 | ORAL_TABLET | Freq: Four times a day (QID) | ORAL | 0 refills | Status: DC | PRN

## 2018-01-03 SURGICAL SUPPLY — 40 items
ANCHOR SUTBIO SWIVELK 3.5X15.8 (Anchor) ×2 IMPLANT
BANDAGE ACE 4X5 VEL STRL LF (GAUZE/BANDAGES/DRESSINGS) ×3 IMPLANT
BIT DRILL 2.0 (BIT) ×2 IMPLANT
BNDG COHESIVE 4X5 TAN STRL (GAUZE/BANDAGES/DRESSINGS) ×2 IMPLANT
CANISTER SUCT 1200ML W/VALVE (MISCELLANEOUS) ×3 IMPLANT
CHLORAPREP W/TINT 26ML (MISCELLANEOUS) ×3 IMPLANT
CLOSURE WOUND 1/2 X4 (GAUZE/BANDAGES/DRESSINGS) ×1
COVER WAND RF STERILE (DRAPES) ×3 IMPLANT
CUFF TOURN 18 STER (MISCELLANEOUS) IMPLANT
CUFF TOURN 24 STER (MISCELLANEOUS) IMPLANT
ELECT CAUTERY BLADE 6.4 (BLADE) ×3 IMPLANT
ELECT REM PT RETURN 9FT ADLT (ELECTROSURGICAL) ×3
ELECTRODE REM PT RTRN 9FT ADLT (ELECTROSURGICAL) ×1 IMPLANT
GAUZE PETRO XEROFOAM 1X8 (MISCELLANEOUS) ×3 IMPLANT
GAUZE SPONGE 4X4 12PLY STRL (GAUZE/BANDAGES/DRESSINGS) ×3 IMPLANT
GLOVE SURG SYN 9.0  PF PI (GLOVE) ×2
GLOVE SURG SYN 9.0 PF PI (GLOVE) ×1 IMPLANT
GOWN SRG 2XL LVL 4 RGLN SLV (GOWNS) ×1 IMPLANT
GOWN STRL NON-REIN 2XL LVL4 (GOWNS) ×2
GOWN STRL REUS W/ TWL LRG LVL3 (GOWN DISPOSABLE) ×1 IMPLANT
GOWN STRL REUS W/TWL LRG LVL3 (GOWN DISPOSABLE) ×2
KIT TURNOVER KIT A (KITS) ×3 IMPLANT
NDL FILTER BLUNT 18X1 1/2 (NEEDLE) ×1 IMPLANT
NDL MAYO 6 CRC TAPER PT (NEEDLE) IMPLANT
NEEDLE FILTER BLUNT 18X 1/2SAF (NEEDLE) ×2
NEEDLE FILTER BLUNT 18X1 1/2 (NEEDLE) ×1 IMPLANT
NEEDLE MAYO 6 CRC TAPER PT (NEEDLE) ×3 IMPLANT
NS IRRIG 500ML POUR BTL (IV SOLUTION) ×3 IMPLANT
PACK EXTREMITY ARMC (MISCELLANEOUS) ×3 IMPLANT
PAD ABD DERMACEA PRESS 5X9 (GAUZE/BANDAGES/DRESSINGS) ×3 IMPLANT
PAD CAST CTTN 4X4 STRL (SOFTGOODS) ×2 IMPLANT
PADDING CAST COTTON 4X4 STRL (SOFTGOODS) ×4
SPONGE LAP 18X18 RF (DISPOSABLE) ×3 IMPLANT
STOCKINETTE IMPERVIOUS 9X36 MD (GAUZE/BANDAGES/DRESSINGS) ×2 IMPLANT
STRIP CLOSURE SKIN 1/2X4 (GAUZE/BANDAGES/DRESSINGS) ×2 IMPLANT
SUT ETHIBOND 0 36 GRN (SUTURE) ×2 IMPLANT
SUT ETHILON NAB PS2 4-0 18IN (SUTURE) ×2 IMPLANT
SUT VIC AB 2-0 SH 27 (SUTURE) ×2
SUT VIC AB 2-0 SH 27XBRD (SUTURE) ×1 IMPLANT
SUT VIC AB 3-0 PS2 18 (SUTURE) ×1 IMPLANT

## 2018-01-03 NOTE — H&P (Signed)
Reviewed paper H+P, will be scanned into chart. No changes noted.  

## 2018-01-03 NOTE — Anesthesia Procedure Notes (Signed)
Procedure Name: LMA Insertion Date/Time: 01/03/2018 3:36 PM Performed by: Dionne Bucy, CRNA Pre-anesthesia Checklist: Patient identified, Patient being monitored, Timeout performed, Emergency Drugs available and Suction available Patient Re-evaluated:Patient Re-evaluated prior to induction Oxygen Delivery Method: Circle system utilized Preoxygenation: Pre-oxygenation with 100% oxygen Induction Type: IV induction Ventilation: Mask ventilation without difficulty LMA: LMA inserted LMA Size: 5.0 Tube type: Oral Number of attempts: 1 Placement Confirmation: positive ETCO2 and breath sounds checked- equal and bilateral Tube secured with: Tape Dental Injury: Teeth and Oropharynx as per pre-operative assessment

## 2018-01-03 NOTE — Anesthesia Post-op Follow-up Note (Signed)
Anesthesia QCDR form completed.        

## 2018-01-03 NOTE — Anesthesia Preprocedure Evaluation (Signed)
Anesthesia Evaluation  Patient identified by MRN, date of birth, ID band Patient awake    Reviewed: Allergy & Precautions, H&P , NPO status , Patient's Chart, lab work & pertinent test results, reviewed documented beta blocker date and time   History of Anesthesia Complications Negative for: history of anesthetic complications  Airway Mallampati: II  TM Distance: >3 FB Neck ROM: full    Dental  (+) Caps, Dental Advidsory Given, Missing, Teeth Intact   Pulmonary neg pulmonary ROS, former smoker,           Cardiovascular Exercise Tolerance: Good negative cardio ROS       Neuro/Psych negative neurological ROS  negative psych ROS   GI/Hepatic negative GI ROS, Neg liver ROS,   Endo/Other  negative endocrine ROS  Renal/GU Renal disease (kidney stones)  negative genitourinary   Musculoskeletal   Abdominal   Peds  Hematology negative hematology ROS (+)   Anesthesia Other Findings Past Medical History: No date: Hemorrhoids No date: History of kidney stones   Reproductive/Obstetrics negative OB ROS                             Anesthesia Physical  Anesthesia Plan  ASA: I  Anesthesia Plan: General   Post-op Pain Management:    Induction: Intravenous  PONV Risk Score and Plan: 2 and Ondansetron and Dexamethasone  Airway Management Planned: LMA  Additional Equipment:   Intra-op Plan:   Post-operative Plan: Extubation in OR  Informed Consent: I have reviewed the patients History and Physical, chart, labs and discussed the procedure including the risks, benefits and alternatives for the proposed anesthesia with the patient or authorized representative who has indicated his/her understanding and acceptance.   Dental Advisory Given  Plan Discussed with: Anesthesiologist, CRNA and Surgeon  Anesthesia Plan Comments:         Anesthesia Quick Evaluation

## 2018-01-03 NOTE — Transfer of Care (Signed)
Immediate Anesthesia Transfer of Care Note  Patient: Jonathan Andrews.  Procedure(s) Performed: TENNIS ELBOW RELEASE/NIRSCHEL PROCEDURE (Right )  Patient Location: PACU  Anesthesia Type:General  Level of Consciousness: sedated  Airway & Oxygen Therapy: Patient Spontanous Breathing and Patient connected to face mask oxygen  Post-op Assessment: Report given to RN and Post -op Vital signs reviewed and stable  Post vital signs: Reviewed and stable  Last Vitals:  Vitals Value Taken Time  BP 132/86 01/03/2018  4:35 PM  Temp 36.2 C 01/03/2018  4:35 PM  Pulse 76 01/03/2018  4:37 PM  Resp 22 01/03/2018  4:37 PM  SpO2 100 % 01/03/2018  4:37 PM  Vitals shown include unvalidated device data.  Last Pain:  Vitals:   01/03/18 1342  TempSrc: Tympanic  PainSc: 4          Complications: No apparent anesthesia complications

## 2018-01-03 NOTE — Discharge Instructions (Signed)
Try to minimize use of the arm okay to work on elbow flexion and extension but no gripping or lifting with the right arm until recheck.  Pain medicine as directed  AMBULATORY SURGERY  DISCHARGE INSTRUCTIONS   1) The drugs that you were given will stay in your system until tomorrow so for the next 24 hours you should not:  A) Drive an automobile B) Make any legal decisions C) Drink any alcoholic beverage   2) You may resume regular meals tomorrow.  Today it is better to start with liquids and gradually work up to solid foods.  You may eat anything you prefer, but it is better to start with liquids, then soup and crackers, and gradually work up to solid foods.   3) Please notify your doctor immediately if you have any unusual bleeding, trouble breathing, redness and pain at the surgery site, drainage, fever, or pain not relieved by medication.    4) Additional Instructions:        Please contact your physician with any problems or Same Day Surgery at [phone], Monday through Friday 6 am to 4 pm, or [place] at [facility] number at [phone].

## 2018-01-03 NOTE — Anesthesia Preprocedure Evaluation (Deleted)
Anesthesia Evaluation  Patient identified by MRN, date of birth, ID band Patient awake    Reviewed: Allergy & Precautions, NPO status , Patient's Chart, lab work & pertinent test results  Airway Mallampati: II       Dental  (+) Teeth Intact   Pulmonary former smoker,    Pulmonary exam normal        Cardiovascular negative cardio ROS Normal cardiovascular exam     Neuro/Psych negative neurological ROS  negative psych ROS   GI/Hepatic Neg liver ROS, GERD  Medicated,  Endo/Other  negative endocrine ROS  Renal/GU stones  negative genitourinary   Musculoskeletal   Abdominal Normal abdominal exam  (+)   Peds negative pediatric ROS (+)  Hematology negative hematology ROS (+)   Anesthesia Other Findings   Reproductive/Obstetrics                            Anesthesia Physical Anesthesia Plan  ASA: II  Anesthesia Plan: General   Post-op Pain Management:    Induction: Intravenous  PONV Risk Score and Plan:   Airway Management Planned: Oral ETT  Additional Equipment:   Intra-op Plan:   Post-operative Plan: Extubation in OR  Informed Consent: I have reviewed the patients History and Physical, chart, labs and discussed the procedure including the risks, benefits and alternatives for the proposed anesthesia with the patient or authorized representative who has indicated his/her understanding and acceptance.     Plan Discussed with: CRNA and Surgeon  Anesthesia Plan Comments:         Anesthesia Quick Evaluation                                  Anesthesia Evaluation  Patient identified by MRN, date of birth, ID band Patient awake    Reviewed: Allergy & Precautions, H&P , NPO status , Patient's Chart, lab work & pertinent test results, reviewed documented beta blocker date and time   History of Anesthesia Complications Negative for: history of anesthetic  complications  Airway Mallampati: II  TM Distance: >3 FB Neck ROM: full    Dental  (+) Caps, Dental Advidsory Given, Missing, Teeth Intact   Pulmonary neg pulmonary ROS, former smoker,           Cardiovascular Exercise Tolerance: Good negative cardio ROS       Neuro/Psych negative neurological ROS  negative psych ROS   GI/Hepatic negative GI ROS, Neg liver ROS,   Endo/Other  negative endocrine ROS  Renal/GU Renal disease (kidney stones)  negative genitourinary   Musculoskeletal   Abdominal   Peds  Hematology negative hematology ROS (+)   Anesthesia Other Findings Past Medical History: No date: Hemorrhoids No date: History of kidney stones   Reproductive/Obstetrics negative OB ROS                             Anesthesia Physical Anesthesia Plan  ASA: I  Anesthesia Plan: General   Post-op Pain Management:    Induction: Intravenous  PONV Risk Score and Plan: 2 and Ondansetron and Dexamethasone  Airway Management Planned: LMA  Additional Equipment:   Intra-op Plan:   Post-operative Plan: Extubation in OR  Informed Consent: I have reviewed the patients History and Physical, chart, labs and discussed the procedure including the risks, benefits and alternatives for the proposed anesthesia with the patient  or authorized representative who has indicated his/her understanding and acceptance.   Dental Advisory Given  Plan Discussed with: Anesthesiologist, CRNA and Surgeon  Anesthesia Plan Comments:         Anesthesia Quick Evaluation                                   Anesthesia Evaluation  Patient identified by MRN, date of birth, ID band Patient awake    Reviewed: Allergy & Precautions, H&P , NPO status , Patient's Chart, lab work & pertinent test results, reviewed documented beta blocker date and time   History of Anesthesia Complications Negative for: history of anesthetic  complications  Airway Mallampati: II  TM Distance: >3 FB Neck ROM: full    Dental  (+) Caps, Dental Advidsory Given, Missing, Teeth Intact   Pulmonary neg pulmonary ROS, former smoker,           Cardiovascular Exercise Tolerance: Good negative cardio ROS       Neuro/Psych negative neurological ROS  negative psych ROS   GI/Hepatic negative GI ROS, Neg liver ROS,   Endo/Other  negative endocrine ROS  Renal/GU Renal disease (kidney stones)  negative genitourinary   Musculoskeletal   Abdominal   Peds  Hematology negative hematology ROS (+)   Anesthesia Other Findings Past Medical History: No date: Hemorrhoids No date: History of kidney stones   Reproductive/Obstetrics negative OB ROS                             Anesthesia Physical Anesthesia Plan  ASA: I  Anesthesia Plan: General   Post-op Pain Management:    Induction: Intravenous  PONV Risk Score and Plan: 2 and Ondansetron and Dexamethasone  Airway Management Planned: LMA  Additional Equipment:   Intra-op Plan:   Post-operative Plan: Extubation in OR  Informed Consent: I have reviewed the patients History and Physical, chart, labs and discussed the procedure including the risks, benefits and alternatives for the proposed anesthesia with the patient or authorized representative who has indicated his/her understanding and acceptance.   Dental Advisory Given  Plan Discussed with: Anesthesiologist, CRNA and Surgeon  Anesthesia Plan Comments:         Anesthesia Quick Evaluation

## 2018-01-03 NOTE — Op Note (Signed)
01/03/2018  4:27 PM  PATIENT:  Jonathan Andrews.  62 y.o. male  PRE-OPERATIVE DIAGNOSIS:  RIGHT LATERAL EPICONDYLITIS recurrent  POST-OPERATIVE DIAGNOSIS:  RIGHT LATERAL EPICONDYLITIS recurrent  PROCEDURE: Repeat repair right lateral epicondylitis  SURGEON: Laurene Footman, MD  ASSISTANTS: None  ANESTHESIA:   general  EBL:  No intake/output data recorded.  BLOOD ADMINISTERED:none  DRAINS: none   LOCAL MEDICATIONS USED:  MARCAINE     SPECIMEN:  No Specimen  DISPOSITION OF SPECIMEN:  N/A  COUNTS:  YES  TOURNIQUET: None  IMPLANTS: None  DICTATION: .Dragon Dictation patient brought the operating room and after adequate anesthesia was obtained, the right arm was prepped and draped in the usual sterile fashion with no tourniquet applied.  After patient identification and timeout procedure were completed, the prior incision was utilized.  The subcutaneous tissue was spread and the lateral epicondyle was exposed anteriorly down to the joint there was some synovitis in the joint which was debrided and the prior repair could be identified with granulation tissue present at the lateral epicondyle.  This was debrided with use of a rongeur to bleeding bone.  Drill hole was made and sutures were passed through the bone tunnel on 2-0 Ethibond to repair the ECRB back to the lateral epicondyle as well as get a watertight seal of the split in the common extensor origin which was created to see into the joint.  The lateral collateral ligament was intact and snug.  After the sutures were passed and tied the elbow appeared to be quite stable and prior granulation tissue had been excised.  The wound was infiltrated with 10 cc half percent Sensorcaine and closed with 2-0 Vicryl subcutaneously and 4-0 nylon for the skin.  Xeroform 4 x 4 web roll and Ace wrap applied  PLAN OF CARE: Discharge to home after PACU  PATIENT DISPOSITION:  PACU - hemodynamically stable.

## 2018-01-04 ENCOUNTER — Encounter: Payer: Self-pay | Admitting: Orthopedic Surgery

## 2018-01-04 NOTE — Anesthesia Postprocedure Evaluation (Signed)
Anesthesia Post Note  Patient: Jonathan Andrews.  Procedure(s) Performed: TENNIS ELBOW RELEASE/NIRSCHEL PROCEDURE (Right )  Patient location during evaluation: PACU Anesthesia Type: General Level of consciousness: awake and alert and oriented Pain management: pain level controlled Vital Signs Assessment: post-procedure vital signs reviewed and stable Respiratory status: spontaneous breathing Cardiovascular status: blood pressure returned to baseline Anesthetic complications: no     Last Vitals:  Vitals:   01/03/18 1738 01/03/18 1754  BP: (!) 159/95 139/75  Pulse: 66 61  Resp: 16 16  Temp: (!) 36.3 C   SpO2: 100% 100%    Last Pain:  Vitals:   01/03/18 1754  TempSrc:   PainSc: 3                  Aiyonna Lucado

## 2018-11-16 DIAGNOSIS — G250 Essential tremor: Secondary | ICD-10-CM | POA: Insufficient documentation

## 2019-01-10 ENCOUNTER — Encounter: Payer: Self-pay | Admitting: Urology

## 2019-01-10 ENCOUNTER — Other Ambulatory Visit: Payer: Self-pay

## 2019-01-10 ENCOUNTER — Ambulatory Visit (INDEPENDENT_AMBULATORY_CARE_PROVIDER_SITE_OTHER): Payer: 59 | Admitting: Urology

## 2019-01-10 VITALS — BP 159/97 | HR 60 | Ht 76.0 in | Wt 229.4 lb

## 2019-01-10 DIAGNOSIS — R350 Frequency of micturition: Secondary | ICD-10-CM

## 2019-01-10 DIAGNOSIS — N401 Enlarged prostate with lower urinary tract symptoms: Secondary | ICD-10-CM | POA: Diagnosis not present

## 2019-01-10 DIAGNOSIS — R300 Dysuria: Secondary | ICD-10-CM | POA: Diagnosis not present

## 2019-01-10 DIAGNOSIS — R3129 Other microscopic hematuria: Secondary | ICD-10-CM

## 2019-01-10 DIAGNOSIS — N138 Other obstructive and reflux uropathy: Secondary | ICD-10-CM

## 2019-01-10 LAB — BLADDER SCAN AMB NON-IMAGING

## 2019-01-10 MED ORDER — SULFAMETHOXAZOLE-TRIMETHOPRIM 800-160 MG PO TABS: 1.0000 | ORAL_TABLET | Freq: Two times a day (BID) | ORAL | 0 refills | Status: DC

## 2019-01-10 NOTE — Patient Instructions (Signed)
Cystoscopy Cystoscopy is a procedure that is used to help diagnose and sometimes treat conditions that affect the lower urinary tract. The lower urinary tract includes the bladder and the urethra. The urethra is the tube that drains urine from the bladder. Cystoscopy is done using a thin, tube-shaped instrument with a light and camera at the end (cystoscope). The cystoscope may be hard or flexible, depending on the goal of the procedure. The cystoscope is inserted through the urethra, into the bladder. Cystoscopy may be recommended if you have:  Urinary tract infections that keep coming back.  Blood in the urine (hematuria).  An inability to control when you urinate (urinary incontinence) or an overactive bladder.  Unusual cells found in a urine sample.  A blockage in the urethra, such as a urinary stone.  Painful urination.  An abnormality in the bladder found during an intravenous pyelogram (IVP) or CT scan. Cystoscopy may also be done to remove a sample of tissue to be examined under a microscope (biopsy). Tell a health care provider about:  Any allergies you have.  All medicines you are taking, including vitamins, herbs, eye drops, creams, and over-the-counter medicines.  Any problems you or family members have had with anesthetic medicines.  Any blood disorders you have.  Any surgeries you have had.  Any medical conditions you have.  Whether you are pregnant or may be pregnant. What are the risks? Generally, this is a safe procedure. However, problems may occur, including:  Infection.  Bleeding.  Allergic reactions to medicines.  Damage to other structures or organs. What happens before the procedure?  Ask your health care provider about: ? Changing or stopping your regular medicines. This is especially important if you are taking diabetes medicines or blood thinners. ? Taking medicines such as aspirin and ibuprofen. These medicines can thin your blood. Do not take  these medicines unless your health care provider tells you to take them. ? Taking over-the-counter medicines, vitamins, herbs, and supplements.  Follow instructions from your health care provider about eating or drinking restrictions.  Ask your health care provider what steps will be taken to help prevent infection. These may include: ? Washing skin with a germ-killing soap. ? Taking antibiotic medicine.  You may have an exam or testing, such as: ? X-rays of the bladder, urethra, or kidneys. ? Urine tests to check for signs of infection.  Plan to have someone take you home from the hospital or clinic. What happens during the procedure?   You will be given one or more of the following: ? A medicine to help you relax (sedative). ? A medicine to numb the area (local anesthetic).  The area around the opening of your urethra will be cleaned.  The cystoscope will be passed through your urethra into your bladder.  Germ-free (sterile) fluid will flow through the cystoscope to fill your bladder. The fluid will stretch your bladder so that your health care provider can clearly examine your bladder walls.  Your doctor will look at the urethra and bladder. Your doctor may take a biopsy or remove stones.  The cystoscope will be removed, and your bladder will be emptied. The procedure may vary among health care providers and hospitals. What can I expect after the procedure? After the procedure, it is common to have:  Some soreness or pain in your abdomen and urethra.  Urinary symptoms. These include: ? Mild pain or burning when you urinate. Pain should stop within a few minutes after you urinate. This   may last for up to 1 week. ? A small amount of blood in your urine for several days. ? Feeling like you need to urinate but producing only a small amount of urine. Follow these instructions at home: Medicines  Take over-the-counter and prescription medicines only as told by your health care  provider.  If you were prescribed an antibiotic medicine, take it as told by your health care provider. Do not stop taking the antibiotic even if you start to feel better. General instructions  Return to your normal activities as told by your health care provider. Ask your health care provider what activities are safe for you.  Do not drive for 24 hours if you were given a sedative during your procedure.  Watch for any blood in your urine. If the amount of blood in your urine increases, call your health care provider.  Follow instructions from your health care provider about eating or drinking restrictions.  If a tissue sample was removed for testing (biopsy) during your procedure, it is up to you to get your test results. Ask your health care provider, or the department that is doing the test, when your results will be ready.  Drink enough fluid to keep your urine pale yellow.  Keep all follow-up visits as told by your health care provider. This is important. Contact a health care provider if you:  Have pain that gets worse or does not get better with medicine, especially pain when you urinate.  Have trouble urinating.  Have more blood in your urine. Get help right away if you:  Have blood clots in your urine.  Have abdominal pain.  Have a fever or chills.  Are unable to urinate. Summary  Cystoscopy is a procedure that is used to help diagnose and sometimes treat conditions that affect the lower urinary tract.  Cystoscopy is done using a thin, tube-shaped instrument with a light and camera at the end.  After the procedure, it is common to have some soreness or pain in your abdomen and urethra.  Watch for any blood in your urine. If the amount of blood in your urine increases, call your health care provider.  If you were prescribed an antibiotic medicine, take it as told by your health care provider. Do not stop taking the antibiotic even if you start to feel better. This  information is not intended to replace advice given to you by your health care provider. Make sure you discuss any questions you have with your health care provider. Document Released: 01/23/2000 Document Revised: 01/17/2018 Document Reviewed: 01/17/2018 Elsevier Patient Education  2020 [employer]  

## 2019-01-10 NOTE — Progress Notes (Signed)
01/10/2019 1:10 PM   Jonathan Kind Jr. 07/20/1955 [id number]  Referring provider: Maryland Pink, MD [address] [facility] [place],  [place] [address]  Chief Complaint  Patient presents with  . Dysuria    HPI:  Jonathan Andrews was referred for dysuria, urgency and frequency. He had MH on UA 12/13/2018 with 4-10 rbc and rare bacteria.  Urine cx + for enterobacter. He was treated with Cipro. His PSA was 1.8 in 2017, 3.61 on 09/06/2018, but UA was + again that time for enterobacter. F/u PSA was 2.76 on 12/06/2018. He can't leave a specimen today. Bladder scan 76 ml.   He typically voids with a good flow. Some dysuria today. He seems to drink plenty of water. No constipation. No NG risk.   He has a h/o kidney stones but no recent flank pain.   Modifying factors: There are no other modifying factors  Associated signs and symptoms: There are no other associated signs and symptoms Aggravating and relieving factors: There are no other aggravating or relieving factors Severity: Moderate Duration: Persistent   PMH: Past Medical History:  Diagnosis Date  . GERD (gastroesophageal reflux disease)   . Hemorrhoids   . History of kidney stones     Surgical History: Past Surgical History:  Procedure Laterality Date  . HAND SURGERY    . HAND SURGERY Bilateral   . TENNIS ELBOW RELEASE/NIRSCHEL PROCEDURE Bilateral 04/14/2017   Procedure: TENNIS ELBOW RELEASE/NIRSCHEL PROCEDURE;  Surgeon: Hessie Knows, MD;  Location: [facility] ORS;  Service: Orthopedics;  Laterality: Bilateral;  . TENNIS ELBOW RELEASE/NIRSCHEL PROCEDURE Right 01/03/2018   Procedure: TENNIS ELBOW RELEASE/NIRSCHEL PROCEDURE;  Surgeon: Hessie Knows, MD;  Location: [facility] ORS;  Service: Orthopedics;  Laterality: Right;  . TONSILLECTOMY      Home Medications:  Allergies as of 01/10/2019      Reactions   Penicillins Hives, Other (See Comments)   Has patient had a PCN reaction causing immediate rash, facial/tongue/throat  swelling, SOB or lightheadedness with hypotension: No Has patient had a PCN reaction causing severe rash involving mucus membranes or skin necrosis: No Has patient had a PCN reaction that required hospitalization: No Has patient had a PCN reaction occurring within the last 10 years: Unknown If all of the above answers are "NO", then may proceed with Cephalosporin use.      Medication List       Accurate as of January 10, 2019  1:10 PM. If you have any questions, ask your nurse or doctor.        STOP taking these medications   HYDROcodone-acetaminophen 5-325 MG tablet Commonly known as: Norco Stopped by: [name], MD     TAKE these medications   acetaminophen 325 MG tablet Commonly known as: TYLENOL Take 650 mg by mouth every 6 (six) hours as needed for moderate pain or headache.   esomeprazole 40 MG capsule Commonly known as: NEXIUM Take 40 mg by mouth as needed.   hydrocortisone 2.5 % rectal cream Commonly known as: ANUSOL-HC APPLY TOPICALLY 3 (THREE) TIMES DAILY FOR 10 DAYS   multivitamin with minerals Tabs tablet Take 1 tablet by mouth daily.   nabumetone 750 MG tablet Commonly known as: RELAFEN Take 750 mg by mouth 2 (two) times daily.   SAW PALMETTO PO Take 2 capsules by mouth daily.       Allergies:  Allergies  Allergen Reactions  . Penicillins Hives and Other (See Comments)    Has patient had a PCN reaction causing immediate rash,  facial/tongue/throat swelling, SOB or lightheadedness with hypotension: No Has patient had a PCN reaction causing severe rash involving mucus membranes or skin necrosis: No Has patient had a PCN reaction that required hospitalization: No Has patient had a PCN reaction occurring within the last 10 years: Unknown If all of the above answers are "NO", then may proceed with Cephalosporin use.     Family History: Family History  Problem Relation Age of Onset  . Prostate cancer Father   . Bladder Cancer Neg Hx   .  Kidney cancer Neg Hx     Social History:  reports that he quit smoking about 32 years ago. His smoking use included cigarettes. He has a 24.00 pack-year smoking history. He has never used smokeless tobacco. He reports current alcohol use. He reports that he does not use drugs.  ROS:                                        Physical Exam: BP (!) 159/97 (BP Location: Left Arm, Patient Position: Sitting, Cuff Size: Normal)   Pulse 60   Ht 6\' 4"  (1.93 m)   Wt 104.1 kg   BMI 27.92 kg/m   Constitutional:  Alert and oriented, No acute distress. HEENT: [place] AT, moist mucus membranes.  Trachea midline, no masses. Cardiovascular: No clubbing, cyanosis, or edema. Respiratory: Normal respiratory effort, no increased work of breathing. GI: Abdomen is soft, nontender, nondistended, no abdominal masses GU: No CVA tenderness GU: Penis uncircumcised, normal foreskin, normal meatus, testicles descended bilaterally and palpably normal, bilateral epididymis palpably normal, scrotum normal DRE: Prostate 45 g, smooth without hard area or nodule Lymph: No cervical or inguinal lymphadenopathy. Skin: No rashes, bruises or suspicious lesions. Neurologic: Grossly intact, no focal deficits, moving all 4 extremities. Psychiatric: Normal mood and affect.  Laboratory Data: Lab Results  Component Value Date   WBC 7.3 06/10/2012   HGB 15.2 06/10/2012   HCT 44.0 06/10/2012   MCV 96 06/10/2012   PLT 202 06/10/2012    Lab Results  Component Value Date   CREATININE 1.55 (H) 06/10/2012    No results found for: PSA  No results found for: TESTOSTERONE  No results found for: HGBA1C  Urinalysis    Component Value Date/Time   COLORURINE Yellow 06/10/2012 2152   APPEARANCEUR Hazy 06/10/2012 2152   LABSPEC 1.016 06/10/2012 2152   PHURINE 8.0 06/10/2012 2152   GLUCOSEU Negative 06/10/2012 2152   HGBUR 2+ 06/10/2012 2152   BILIRUBINUR Negative 06/10/2012 2152   KETONESUR Negative  06/10/2012 2152   PROTEINUR 30 mg/dL 06/10/2012 2152   NITRITE Negative 06/10/2012 2152   LEUKOCYTESUR Negative 06/10/2012 2152    Lab Results  Component Value Date   BACTERIA TRACE 06/10/2012    Pertinent Imaging: N/a CE notes, labs  No results found for this or any previous visit. No results found for this or any previous visit. No results found for this or any previous visit. No results found for this or any previous visit. No results found for this or any previous visit. No results found for this or any previous visit. No results found for this or any previous visit. No results found for this or any previous visit.  Assessment & Plan:    1. Dysuria PVR nl. Treat with a round of bactrim DS x 7 days  - Urinalysis, Complete  2> BPH, MH, UTI - eval with CT  and cysto. H/o stones. Cr 1.1 on 09/06/2018. No follow-ups on file.  [name], MD  [facility] [address] [place], [place] [address] [phone]

## 2019-02-08 ENCOUNTER — Telehealth: Payer: Self-pay | Admitting: Urology

## 2019-02-08 DIAGNOSIS — R3129 Other microscopic hematuria: Secondary | ICD-10-CM

## 2019-02-08 NOTE — Telephone Encounter (Signed)
Ok to change

## 2019-02-08 NOTE — Telephone Encounter (Signed)
Pt called and states that he had a CT ordered WWO contrast and he has had a reaction in the past to contrast. He states that it caused him to have a Migraine, tooth pain and also a kidney stone attack. He would like the order to be changed to WO contrast if possible.He is scheduled to come in to office on 02/16/2019 to see Dr Matilde Sprang for CT results. Please advise.

## 2019-02-12 NOTE — Telephone Encounter (Signed)
I think this is Dr Junious Silk pt thanks

## 2019-02-12 NOTE — Telephone Encounter (Signed)
CTscan order was changed and radiology scheduling was notified to change and allergy was updated in the chart

## 2019-02-16 ENCOUNTER — Other Ambulatory Visit: Payer: 59 | Admitting: Urology

## 2019-02-22 ENCOUNTER — Other Ambulatory Visit: Payer: Self-pay

## 2019-02-22 ENCOUNTER — Ambulatory Visit
Admission: RE | Admit: 2019-02-22 | Discharge: 2019-02-22 | Disposition: A | Discharge status: Home / Self Care | Payer: BC Managed Care – PPO | Source: Ambulatory Visit | Attending: Urology | Admitting: Urology

## 2019-02-22 DIAGNOSIS — R3129 Other microscopic hematuria: Secondary | ICD-10-CM | POA: Diagnosis present

## 2019-03-02 ENCOUNTER — Other Ambulatory Visit: Payer: 59 | Admitting: Urology

## 2019-03-16 ENCOUNTER — Encounter: Payer: Self-pay | Admitting: Urology

## 2019-03-16 ENCOUNTER — Ambulatory Visit (INDEPENDENT_AMBULATORY_CARE_PROVIDER_SITE_OTHER): Payer: BC Managed Care – PPO | Admitting: Urology

## 2019-03-16 ENCOUNTER — Other Ambulatory Visit: Payer: Self-pay

## 2019-03-16 VITALS — BP 160/98 | HR 75 | Ht 76.0 in | Wt 229.0 lb

## 2019-03-16 DIAGNOSIS — N210 Calculus in bladder: Secondary | ICD-10-CM

## 2019-03-16 DIAGNOSIS — R3129 Other microscopic hematuria: Secondary | ICD-10-CM | POA: Diagnosis not present

## 2019-03-16 DIAGNOSIS — N200 Calculus of kidney: Secondary | ICD-10-CM

## 2019-03-16 DIAGNOSIS — N281 Cyst of kidney, acquired: Secondary | ICD-10-CM

## 2019-03-16 DIAGNOSIS — R300 Dysuria: Secondary | ICD-10-CM

## 2019-03-16 LAB — URINALYSIS, COMPLETE
Bilirubin, UA: NEGATIVE
Glucose, UA: NEGATIVE
Ketones, UA: NEGATIVE
Nitrite, UA: NEGATIVE
Protein,UA: NEGATIVE
Specific Gravity, UA: 1.015 (ref 1.005–1.030)
Urobilinogen, Ur: 0.2 mg/dL (ref 0.2–1.0)
pH, UA: 7 (ref 5.0–7.5)

## 2019-03-16 LAB — MICROSCOPIC EXAMINATION: Bacteria, UA: NONE SEEN

## 2019-03-16 MED ORDER — LIDOCAINE HCL URETHRAL/MUCOSAL 2 % EX GEL
1.0000 "application " | Freq: Once | CUTANEOUS | Status: AC
  Administered 2019-03-16: 1 via URETHRAL

## 2019-03-16 MED ORDER — CIPROFLOXACIN HCL 500 MG PO TABS
500.0000 mg | ORAL_TABLET | Freq: Once | ORAL | Status: AC
  Administered 2019-03-16: 500 mg via ORAL

## 2019-03-16 NOTE — H&P (View-Only) (Signed)
   03/16/19  CC: No chief complaint on file.   HPI:  F/u for cystoscopy. CT 02/22/2019 revealed a 3 cm bladder stone. Prostate only 40 grams. 7 mm LMP stone. Bilateral renal cysts. He couldn't leave a specimen today. He was covered with a Cipro.   He was referred for dysuria, urgency and frequency. He had MH on UA 12/13/2018 with 4-10 rbc and rare bacteria.  Urine cx + for enterobacter. He was treated with Cipro. His PSA was 1.8 in 2017, 3.61 on 09/06/2018, but UA was + again that time for enterobacter. F/u PSA was 2.76 on 12/06/2018. Bladder scan 76 ml. He typically voids with a good flow. He seems to drink plenty of water. No constipation. No NG risk.   He has a h/o kidney stones but no recent flank pain.  There were no vitals taken for this visit. NED. A&Ox3.   No respiratory distress   Abd soft, NT, ND Normal phallus with bilateral descended testicles  Cystoscopy Procedure Note  Patient identification was confirmed, informed consent was obtained, and patient was prepped using Betadine solution.  Lidocaine jelly was administered per urethral meatus.     Pre-Procedure: - Inspection reveals a normal caliber ureteral meatus.  Procedure: The flexible cystoscope was introduced without difficulty - No urethral strictures/lesions are present. - prostate mild hyperplasia, borderline obstruction - normal bladder neck - Bilateral ureteral orifices identified - Bladder mucosa  reveals no ulcers, tumors, or lesions - No bladder stones - No trabeculation  Retroflexion shows bladder stone, normal bladder neck   Post-Procedure: - Patient tolerated the procedure well  Assessment/ Plan:  Bladder stone - likely cause of dysuria. Discussed the nature r/b/a to laser cystolithopaxy and he elects to proceed. Discussed rarely stone too hard/to big and may take staged/open procedure. Discussed he may need a foley post-op.   Renal cysts - discussed benign nature, surveillance  Renal stone -  LMP - discussed surveillance vs ESWL vs URS. KUB in future.   Microhematuria - benign evaluation   [name], MD

## 2019-03-16 NOTE — Patient Instructions (Signed)

## 2019-03-16 NOTE — Progress Notes (Signed)
   03/16/19  CC: No chief complaint on file.   HPI:  F/u for cystoscopy. CT 02/22/2019 revealed a 3 cm bladder stone. Prostate only 40 grams. 7 mm LMP stone. Bilateral renal cysts. He couldn't leave a specimen today. He was covered with a Cipro.   He was referred for dysuria, urgency and frequency. He had MH on UA 12/13/2018 with 4-10 rbc and rare bacteria.  Urine cx + for enterobacter. He was treated with Cipro. His PSA was 1.8 in 2017, 3.61 on 09/06/2018, but UA was + again that time for enterobacter. F/u PSA was 2.76 on 12/06/2018. Bladder scan 76 ml. He typically voids with a good flow. He seems to drink plenty of water. No constipation. No NG risk.   He has a h/o kidney stones but no recent flank pain.  There were no vitals taken for this visit. NED. A&Ox3.   No respiratory distress   Abd soft, NT, ND Normal phallus with bilateral descended testicles  Cystoscopy Procedure Note  Patient identification was confirmed, informed consent was obtained, and patient was prepped using Betadine solution.  Lidocaine jelly was administered per urethral meatus.     Pre-Procedure: - Inspection reveals a normal caliber ureteral meatus.  Procedure: The flexible cystoscope was introduced without difficulty - No urethral strictures/lesions are present. - prostate mild hyperplasia, borderline obstruction - normal bladder neck - Bilateral ureteral orifices identified - Bladder mucosa  reveals no ulcers, tumors, or lesions - No bladder stones - No trabeculation  Retroflexion shows bladder stone, normal bladder neck   Post-Procedure: - Patient tolerated the procedure well  Assessment/ Plan:  Bladder stone - likely cause of dysuria. Discussed the nature r/b/a to laser cystolithopaxy and he elects to proceed. Discussed rarely stone too hard/to big and may take staged/open procedure. Discussed he may need a foley post-op.   Renal cysts - discussed benign nature, surveillance  Renal stone -  LMP - discussed surveillance vs ESWL vs URS. KUB in future.   Microhematuria - benign evaluation   [name], MD

## 2019-03-22 ENCOUNTER — Other Ambulatory Visit: Payer: Self-pay | Admitting: Radiology

## 2019-03-22 ENCOUNTER — Other Ambulatory Visit: Payer: Self-pay

## 2019-03-22 ENCOUNTER — Other Ambulatory Visit: Payer: Self-pay | Admitting: Urology

## 2019-03-22 ENCOUNTER — Encounter
Admission: RE | Admit: 2019-03-22 | Discharge: 2019-03-22 | Disposition: A | Discharge status: Home / Self Care | Source: Ambulatory Visit | Attending: Urology | Admitting: Urology

## 2019-03-22 DIAGNOSIS — N210 Calculus in bladder: Secondary | ICD-10-CM

## 2019-03-22 HISTORY — DX: Calculus in bladder: N21.0

## 2019-03-22 NOTE — Pre-Procedure Instructions (Signed)
  Patient had a positive Covid test on 03/05/19 and will not be retested. He has residual joint pain but no respiratory symptoms or fever. Surgical instructions emailed to patient.

## 2019-03-22 NOTE — Patient Instructions (Signed)
Your procedure is scheduled on: Tues. 2/16 Report to Day Surgery. To find out your arrival time please call [phone] between 1PM - 3PM on Mon. 2/15.  Remember: Instructions that are not followed completely may result in serious medical risk,  up to and including death, or upon the discretion of your surgeon and anesthesiologist your  surgery may need to be rescheduled.     _X__ 1. Do not eat food after midnight the night before your procedure.                 No gum chewing or hard candies. You may drink clear liquids up to 2 hours                 before you are scheduled to arrive for your surgery- DO not drink clear                 liquids within 2 hours of the start of your surgery.                 Clear Liquids include:  water, apple juice without pulp, clear carbohydrate                 drink such as Clearfast of Gatorade, Black Coffee or Tea (Do not add                 anything to coffee or tea).  __X__2.  On the morning of surgery brush your teeth with toothpaste and water, you                may rinse your mouth with mouthwash if you wish.  Do not swallow any toothpaste of mouthwash.     _X__ 3.  No Alcohol for 24 hours before or after surgery.   ___ 4.  Do Not Smoke or use e-cigarettes For 24 Hours Prior to Your Surgery.                 Do not use any chewable tobacco products for at least 6 hours prior to                 surgery.  ____  5.  Bring all medications with you on the day of surgery if instructed.   __x__  6.  Notify your doctor if there is any change in your medical condition      (cold, fever, infections).     Do not wear jewelry, make-up, hairpins, clips or nail polish. Do not wear lotions, powders, or perfumes. You may wear deodorant. Do not shave 48 hours prior to surgery. Men may shave face and neck. Do not bring valuables to the hospital.    [facility] is not responsible for any belongings or valuables.  Contacts, dentures  or bridgework may not be worn into surgery. Leave your suitcase in the car. After surgery it may be brought to your room. For patients admitted to the hospital, discharge time is determined by your treatment team.   Patients discharged the day of surgery will not be allowed to drive home.   Please read over the following fact sheets that you were given:    __x__ Take these medicines the morning of surgery with A SIP OF WATER:    1. acetaminophen (TYLENOL) 325 MG tablet if needed  2.   3.   4.  5.  6.  ____ Fleet Enema (as directed)   _x___ shower the night before and the morning  of surgery  ____ Use inhalers on the day of surgery  ____ Stop metformin 2 days prior to surgery    ____ Take 1/2 of usual insulin dose the night before surgery. No insulin the morning          of surgery.   ____ Stop Coumadin/Plavix/aspirin   __x__ Stop Anti-inflammatories ibuprofen aleve or aspirin products until after surgery   __x__ Stop supplements until after surgery.  Saw Palmetto, Serenoa repens, (SAW PALMETTO PO)  ____ Bring C-Pap to the hospital.

## 2019-03-23 ENCOUNTER — Other Ambulatory Visit

## 2019-03-23 ENCOUNTER — Other Ambulatory Visit: Admission: RE | Admit: 2019-03-23 | Source: Ambulatory Visit

## 2019-03-26 MED ORDER — CIPROFLOXACIN IN D5W 400 MG/200ML IV SOLN
400.0000 mg | INTRAVENOUS | Status: AC
  Administered 2019-03-27: 400 mg via INTRAVENOUS

## 2019-03-27 ENCOUNTER — Ambulatory Visit: Payer: BC Managed Care – PPO | Admitting: Anesthesiology

## 2019-03-27 ENCOUNTER — Encounter: Payer: Self-pay | Admitting: Urology

## 2019-03-27 ENCOUNTER — Encounter
Admission: RE | Disposition: A | Discharge status: Home / Self Care | Payer: Self-pay | Source: Home / Self Care | Attending: Urology

## 2019-03-27 ENCOUNTER — Ambulatory Visit
Admission: RE | Admit: 2019-03-27 | Discharge: 2019-03-27 | Disposition: A | Discharge status: Home / Self Care | Payer: BC Managed Care – PPO | Attending: Urology | Admitting: Urology

## 2019-03-27 ENCOUNTER — Other Ambulatory Visit: Payer: Self-pay

## 2019-03-27 DIAGNOSIS — Z87891 Personal history of nicotine dependence: Secondary | ICD-10-CM | POA: Insufficient documentation

## 2019-03-27 DIAGNOSIS — N210 Calculus in bladder: Secondary | ICD-10-CM

## 2019-03-27 DIAGNOSIS — N281 Cyst of kidney, acquired: Secondary | ICD-10-CM | POA: Insufficient documentation

## 2019-03-27 DIAGNOSIS — Z87442 Personal history of urinary calculi: Secondary | ICD-10-CM | POA: Insufficient documentation

## 2019-03-27 DIAGNOSIS — K219 Gastro-esophageal reflux disease without esophagitis: Secondary | ICD-10-CM | POA: Diagnosis not present

## 2019-03-27 DIAGNOSIS — R3129 Other microscopic hematuria: Secondary | ICD-10-CM | POA: Diagnosis not present

## 2019-03-27 DIAGNOSIS — N200 Calculus of kidney: Secondary | ICD-10-CM | POA: Diagnosis not present

## 2019-03-27 HISTORY — PX: CYSTOSCOPY WITH LITHOLAPAXY: SHX1425

## 2019-03-27 SURGERY — CYSTOSCOPY, WITH BLADDER CALCULUS LITHOLAPAXY
Anesthesia: General

## 2019-03-27 MED ORDER — OXYCODONE HCL 5 MG/5ML PO SOLN: 5.0000 mg | Freq: Once | ORAL | Status: DC | PRN

## 2019-03-27 MED ORDER — PROPOFOL 10 MG/ML IV BOLUS
INTRAVENOUS | Status: DC | PRN
  Administered 2019-03-27: 200 mg via INTRAVENOUS

## 2019-03-27 MED ORDER — HYDROCODONE-ACETAMINOPHEN 5-325 MG PO TABS: 1.0000 | ORAL_TABLET | ORAL | 0 refills | Status: DC | PRN

## 2019-03-27 MED ORDER — KETOROLAC TROMETHAMINE 30 MG/ML IJ SOLN
INTRAMUSCULAR | Status: DC | PRN
  Administered 2019-03-27: 30 mg via INTRAVENOUS

## 2019-03-27 MED ORDER — FENTANYL CITRATE (PF) 100 MCG/2ML IJ SOLN
INTRAMUSCULAR | Status: AC
  Filled 2019-03-27: qty 2

## 2019-03-27 MED ORDER — FENTANYL CITRATE (PF) 100 MCG/2ML IJ SOLN
INTRAMUSCULAR | Status: AC
  Administered 2019-03-27: 11:00:00 25 ug via INTRAVENOUS
  Filled 2019-03-27: qty 2

## 2019-03-27 MED ORDER — FENTANYL CITRATE (PF) 100 MCG/2ML IJ SOLN
INTRAMUSCULAR | Status: DC | PRN
  Administered 2019-03-27 (×2): 25 ug via INTRAVENOUS
  Administered 2019-03-27: 50 ug via INTRAVENOUS
  Administered 2019-03-27: 25 ug via INTRAVENOUS
  Administered 2019-03-27: 75 ug via INTRAVENOUS

## 2019-03-27 MED ORDER — ACETAMINOPHEN 10 MG/ML IV SOLN
INTRAVENOUS | Status: DC | PRN
  Administered 2019-03-27: 1000 mg via INTRAVENOUS

## 2019-03-27 MED ORDER — ONDANSETRON HCL 4 MG/2ML IJ SOLN
INTRAMUSCULAR | Status: DC | PRN
  Administered 2019-03-27: 4 mg via INTRAVENOUS

## 2019-03-27 MED ORDER — EPHEDRINE SULFATE 50 MG/ML IJ SOLN
INTRAMUSCULAR | Status: DC | PRN
  Administered 2019-03-27: 7.5 mg via INTRAVENOUS
  Administered 2019-03-27: 5 mg via INTRAVENOUS

## 2019-03-27 MED ORDER — FENTANYL CITRATE (PF) 100 MCG/2ML IJ SOLN
25.0000 ug | INTRAMUSCULAR | Status: DC | PRN
  Administered 2019-03-27: 11:00:00 50 ug via INTRAVENOUS
  Administered 2019-03-27: 11:00:00 25 ug via INTRAVENOUS

## 2019-03-27 MED ORDER — ACETAMINOPHEN 10 MG/ML IV SOLN
INTRAVENOUS | Status: AC
  Filled 2019-03-27: qty 100

## 2019-03-27 MED ORDER — CIPROFLOXACIN IN D5W 400 MG/200ML IV SOLN
INTRAVENOUS | Status: AC
  Filled 2019-03-27: qty 200

## 2019-03-27 MED ORDER — LIDOCAINE HCL (CARDIAC) PF 100 MG/5ML IV SOSY
PREFILLED_SYRINGE | INTRAVENOUS | Status: DC | PRN
  Administered 2019-03-27: 100 mg via INTRAVENOUS

## 2019-03-27 MED ORDER — EPHEDRINE SULFATE 50 MG/ML IJ SOLN
INTRAMUSCULAR | Status: AC
  Filled 2019-03-27: qty 1

## 2019-03-27 MED ORDER — OXYCODONE HCL 5 MG PO TABS: 5.0000 mg | ORAL_TABLET | Freq: Once | ORAL | Status: DC | PRN

## 2019-03-27 MED ORDER — DEXAMETHASONE SODIUM PHOSPHATE 10 MG/ML IJ SOLN
INTRAMUSCULAR | Status: DC | PRN
  Administered 2019-03-27: 5 mg via INTRAVENOUS

## 2019-03-27 MED ORDER — LACTATED RINGERS IV SOLN
INTRAVENOUS | Status: DC
  Administered 2019-03-27: 50 mL/h via INTRAVENOUS

## 2019-03-27 MED ORDER — FAMOTIDINE 20 MG PO TABS: 20.0000 mg | ORAL_TABLET | Freq: Once | ORAL | Status: AC

## 2019-03-27 MED ORDER — FAMOTIDINE 20 MG PO TABS
ORAL_TABLET | ORAL | Status: AC
  Administered 2019-03-27: 20 mg via ORAL
  Filled 2019-03-27: qty 1

## 2019-03-27 SURGICAL SUPPLY — 19 items
BAG DRAIN CYSTO-URO LG1000N (MISCELLANEOUS) ×3 IMPLANT
BAG URINE DRAIN 2000ML AR STRL (UROLOGICAL SUPPLIES) ×3 IMPLANT
BASKET ZERO TIP 1.9FR (BASKET) IMPLANT
BLADE SURG SZ11 CARB STEEL (BLADE) ×3 IMPLANT
CATH FOL 2WAY LX 18X30 (CATHETERS) ×3 IMPLANT
FIBER LASER FLEXIVA 1000 (UROLOGICAL SUPPLIES) ×3 IMPLANT
GLOVE BIO SURGEON STRL SZ8 (GLOVE) ×3 IMPLANT
GOWN STRL REUS W/ TWL LRG LVL3 (GOWN DISPOSABLE) ×2 IMPLANT
GOWN STRL REUS W/ TWL XL LVL3 (GOWN DISPOSABLE) ×1 IMPLANT
GOWN STRL REUS W/TWL LRG LVL3 (GOWN DISPOSABLE) ×4
GOWN STRL REUS W/TWL XL LVL3 (GOWN DISPOSABLE) ×2
IV NS IRRIG 3000ML ARTHROMATIC (IV SOLUTION) ×15 IMPLANT
KIT PROBE TRILOGY 3.9X350 (MISCELLANEOUS) IMPLANT
KIT TURNOVER CYSTO (KITS) ×3 IMPLANT
PACK CYSTO AR (MISCELLANEOUS) ×3 IMPLANT
SET IRRIG Y TYPE TUR BLADDER L (SET/KITS/TRAYS/PACK) ×3 IMPLANT
SURGILUBE 2OZ TUBE FLIPTOP (MISCELLANEOUS) ×3 IMPLANT
SYRINGE IRR TOOMEY STRL 70CC (SYRINGE) ×3 IMPLANT
WATER STERILE IRR 1000ML POUR (IV SOLUTION) ×3 IMPLANT

## 2019-03-27 NOTE — Op Note (Signed)
Preoperative diagnosis:  1. Bladder calculus  Postoperative diagnosis:  1. Bladder calculus  Procedure: 1. Cystolitholapaxy (>2.5 cm)  Surgeon: Abbie Sons, MD  Anesthesia: General  Complications: None  Intraoperative findings: Large bladder calculus  EBL: Minimal  Specimens: None  Indication: Jonathan Andrews. is a 64 y.o. patient with dysuria and microhematuria.  He was found to have a 3 cm bladder calculus.  After reviewing the management options for treatment, he elected to proceed with the above surgical procedure(s). We have discussed the potential benefits and risks of the procedure, side effects of the proposed treatment, the likelihood of the patient achieving the goals of the procedure, and any potential problems that might occur during the procedure or recuperation. Informed consent has been obtained.  Description of procedure:  The patient was taken to the operating room and general anesthesia was induced.  The patient was placed in the dorsal lithotomy position, prepped and draped in the usual sterile fashion, and preoperative antibiotics were administered. A preoperative time-out was performed.   The urethra would not accept a 26 French continuous-flow resectoscope sheath with visual obturator.  Leander Rams sounds from 24-28 [place] were passed in the distal urethra without difficulty.  The continuous-flow resectoscope sheath was then easily passed per urethra.  The urethra was normal caliber without stricture.  Prostate showed mild lateral lobe enlargement with moderate bladder neck elevation.  Panendoscopy showed no solid or papillary bladder lesions.  The ureteral orifice ease were normal-appearing bilaterally.  The visual obturator was removed and replaced with a laser bridge.  A 1000 m holmium laser fiber was then inserted through the resectoscope sheath.  Cystolitholapaxy was then performed at initial settings of 0.5 J / 40 Hz and increased to 1 J / 40 Hz.  The  calculus was completely fragmented and at the completion of procedure all fragments were removed via drain/fill.  Mild oozing of the bladder neck was noted.  An 23 French Foley catheter was placed without difficulty with return of pink-tinged effluent upon irrigation.  After anesthetic reversal patient was transported to PACU in stable condition.  Plan: Foley catheter to remain indwelling overnight and he will follow up in the office tomorrow morning for catheter removal   Abbie Sons, M.D.

## 2019-03-27 NOTE — Discharge Instructions (Addendum)
Cystoscopy/bladder stone patient instructions  Following a cystoscopy, a catheter (a flexible rubber tube) is sometimes left in place to empty the bladder. This may cause some discomfort or a feeling that you need to urinate. Your doctor determines the period of time that the catheter will be left in place. You may have bloody urine for two to three days (Call your doctor if the amount of bleeding increases or does not subside).  You may pass blood clots in your urine, especially if you had a biopsy. It is not unusual to pass small blood clots and have some bloody urine a couple of weeks after your cystoscopy. Again, call your doctor if the bleeding does not subside. You may have: Dysuria (painful urination) Frequency (urinating often) Urgency (strong desire to urinate)  These symptoms are common especially if medicine is instilled into the bladder or a ureteral stent is placed. Avoiding alcohol and caffeine, such as coffee, tea, and chocolate, may help relieve these symptoms. Drink plenty of water, unless otherwise instructed. Your doctor may also prescribe an antibiotic or other medicine to reduce these symptoms.  Cystoscopy results are available soon after the procedure; biopsy results usually take two to four days. Your doctor will discuss the results of your exam with you. Before you go home, you will be given specific instructions for follow-up care. Special Instructions:  1  If you are going home with a catheter in place do not take a tub bath until removed by your doctor.  2  You may resume your normal activities.  3  Do not drive or operate machinery if you are taking narcotic pain medicine.  4  Be sure to keep all follow-up appointments with your doctor.  5 A prescription for pain medication was sent to your pharmacy  6 Call Your Doctor If: The catheter is not draining  You have severe pain  You are unable to urinate  You have a fever over 101  You have severe  bleeding    AMBULATORY SURGERY  DISCHARGE INSTRUCTIONS   1) The drugs that you were given will stay in your system until tomorrow so for the next 24 hours you should not:  A) Drive an automobile B) Make any legal decisions C) Drink any alcoholic beverage   2) You may resume regular meals tomorrow.  Today it is better to start with liquids and gradually work up to solid foods.  You may eat anything you prefer, but it is better to start with liquids, then soup and crackers, and gradually work up to solid foods.   3) Please notify your doctor immediately if you have any unusual bleeding, trouble breathing, redness and pain at the surgery site, drainage, fever, or pain not relieved by medication.    4) Additional Instructions:        Please contact your physician with any problems or Same Day Surgery at [phone], Monday through Friday 6 am to 4 pm, or [place] at [facility] number at [phone].

## 2019-03-27 NOTE — Anesthesia Preprocedure Evaluation (Signed)
Anesthesia Evaluation  Patient identified by MRN, date of birth, ID band Patient awake    Reviewed: Allergy & Precautions, H&P , NPO status , Patient's Chart, lab work & pertinent test results  History of Anesthesia Complications Negative for: history of anesthetic complications  Airway Mallampati: III  TM Distance: >3 FB Neck ROM: full    Dental  (+) Chipped, Poor Dentition   Pulmonary neg pulmonary ROS, neg shortness of breath, former smoker,           Cardiovascular Exercise Tolerance: Good (-) angina(-) Past MI and (-) DOE negative cardio ROS       Neuro/Psych negative neurological ROS  negative psych ROS   GI/Hepatic Neg liver ROS, GERD  Medicated and Controlled,  Endo/Other  negative endocrine ROS  Renal/GU      Musculoskeletal   Abdominal   Peds  Hematology negative hematology ROS (+)   Anesthesia Other Findings Past Medical History: No date: Bladder stones No date: GERD (gastroesophageal reflux disease) No date: Hemorrhoids No date: History of kidney stones  Past Surgical History: No date: HAND SURGERY No date: HAND SURGERY; Bilateral 04/14/2017: TENNIS ELBOW RELEASE/NIRSCHEL PROCEDURE; Bilateral     Comment:  Procedure: TENNIS ELBOW RELEASE/NIRSCHEL PROCEDURE;                Surgeon: Hessie Knows, MD;  Location: [facility] ORS;                Service: Orthopedics;  Laterality: Bilateral; 01/03/2018: TENNIS ELBOW RELEASE/NIRSCHEL PROCEDURE; Right     Comment:  Procedure: TENNIS ELBOW RELEASE/NIRSCHEL PROCEDURE;                Surgeon: Hessie Knows, MD;  Location: [facility] ORS;                Service: Orthopedics;  Laterality: Right; No date: TONSILLECTOMY  BMI    Body Mass Index: 27.88 kg/m      Reproductive/Obstetrics negative OB ROS                             Anesthesia Physical Anesthesia Plan  ASA: III  Anesthesia Plan: General LMA   Post-op Pain Management:     Induction: Intravenous  PONV Risk Score and Plan: Dexamethasone, Ondansetron, Midazolam and Treatment may vary due to age or medical condition  Airway Management Planned: LMA  Additional Equipment:   Intra-op Plan:   Post-operative Plan: Extubation in OR  Informed Consent: I have reviewed the patients History and Physical, chart, labs and discussed the procedure including the risks, benefits and alternatives for the proposed anesthesia with the patient or authorized representative who has indicated his/her understanding and acceptance.     Dental Advisory Given  Plan Discussed with: Anesthesiologist, CRNA and Surgeon  Anesthesia Plan Comments: (Patient consented for risks of anesthesia including but not limited to:  - adverse reactions to medications - damage to teeth, lips or other oral mucosa - sore throat or hoarseness - Damage to heart, brain, lungs or loss of life  Patient voiced understanding.)        Anesthesia Quick Evaluation

## 2019-03-27 NOTE — OR Nursing (Signed)
Discharge was pending spouse arrival back to hospital.

## 2019-03-27 NOTE — Transfer of Care (Signed)
Immediate Anesthesia Transfer of Care Note  Patient: Jonathan Andrews.  Procedure(s) Performed: CYSTOSCOPY WITH LITHOLAPAXY (N/A )  Patient Location: PACU  Anesthesia Type:General  Level of Consciousness: awake, drowsy and patient cooperative  Airway & Oxygen Therapy: Patient Spontanous Breathing  Post-op Assessment: Report given to RN and Post -op Vital signs reviewed and stable  Post vital signs: Reviewed and stable  Last Vitals:  Vitals Value Taken Time  BP 144/95 03/27/19 1050  Temp 35.9 C 03/27/19 1050  Pulse 83 03/27/19 1053  Resp 11 03/27/19 1053  SpO2 94 % 03/27/19 1053  Vitals shown include unvalidated device data.  Last Pain:  Vitals:   03/27/19 0808  TempSrc: Tympanic  PainSc: 0-No pain         Complications: No apparent anesthesia complications

## 2019-03-27 NOTE — Anesthesia Procedure Notes (Signed)
Procedure Name: LMA Insertion Date/Time: 03/27/2019 9:16 AM Performed by: Lowry Bowl, CRNA Pre-anesthesia Checklist: Patient identified, Timeout performed, Patient being monitored, Suction available and Emergency Drugs available Patient Re-evaluated:Patient Re-evaluated prior to induction Oxygen Delivery Method: Circle system utilized Preoxygenation: Pre-oxygenation with 100% oxygen Induction Type: IV induction LMA: LMA inserted LMA Size: 5.0 Number of attempts: 1 Placement Confirmation: positive ETCO2 and breath sounds checked- equal and bilateral Tube secured with: Tape Dental Injury: Teeth and Oropharynx as per pre-operative assessment

## 2019-03-27 NOTE — Interval H&P Note (Signed)
History and Physical Interval Note: The procedure was discussed with Jonathan Andrews.  He has a large stone with Hounsfield units of approximately 1500 and we discussed the possible need for a staged procedure. CV: RRR Lungs: Clear  03/27/2019 8:42 AM  Jonathan Andrews.  has presented today for surgery, with the diagnosis of Bladder Stone.  The various methods of treatment have been discussed with the patient and family. After consideration of risks, benefits and other options for treatment, the patient has consented to  Procedure(s): CYSTOSCOPY WITH LITHOLAPAXY (N/A) as a surgical intervention.  The patient's history has been reviewed, patient examined, no change in status, stable for surgery.  I have reviewed the patient's chart and labs.  Questions were answered to the patient's satisfaction.     [place]

## 2019-03-28 ENCOUNTER — Ambulatory Visit (INDEPENDENT_AMBULATORY_CARE_PROVIDER_SITE_OTHER): Payer: BC Managed Care – PPO | Admitting: Urology

## 2019-03-28 DIAGNOSIS — N210 Calculus in bladder: Secondary | ICD-10-CM

## 2019-03-28 NOTE — Progress Notes (Signed)
Catheter Removal Patient is present today for a catheter removal after undergoing a cystolitholapaxy for a 3 cm bladder stone.  10 ml of water was drained from the balloon. An 62 FR foley cath was removed from the bladder no complications were noted . Patient tolerated well.  Performed by: Zara Council, PA-C  Follow up/ Additional notes: Patient given a work note to be out for the rest of the week and to return to normal duties on 04/02/2019.    He is instructed to increase fluid intake to keep the urine clear and clot free.  He is to return on April 30, 2019 with Dr. Bernardo Heater for postoperative visit and to discuss treatment of his 7 mm lower pole stone in his left kidney.

## 2019-03-28 NOTE — Anesthesia Postprocedure Evaluation (Signed)
Anesthesia Post Note  Patient: Jonathan Andrews.  Procedure(s) Performed: CYSTOSCOPY WITH LITHOLAPAXY (N/A )  Patient location during evaluation: PACU Anesthesia Type: General Level of consciousness: awake and alert Pain management: pain level controlled Vital Signs Assessment: post-procedure vital signs reviewed and stable Respiratory status: spontaneous breathing, nonlabored ventilation and respiratory function stable Cardiovascular status: blood pressure returned to baseline and stable Postop Assessment: no apparent nausea or vomiting Anesthetic complications: no     Last Vitals:  Vitals:   03/27/19 1130 03/27/19 1212  BP: (!) 147/90 (!) 162/84  Pulse: 78 77  Resp: 16 16  Temp: (!) 36.4 C   SpO2: 97% 100%    Last Pain:  Vitals:   03/28/19 0828  TempSrc:   PainSc: 0-No pain                 Tera Mater

## 2019-04-30 ENCOUNTER — Other Ambulatory Visit: Payer: Self-pay

## 2019-04-30 ENCOUNTER — Ambulatory Visit (INDEPENDENT_AMBULATORY_CARE_PROVIDER_SITE_OTHER): Payer: BC Managed Care – PPO | Admitting: Urology

## 2019-04-30 ENCOUNTER — Encounter: Payer: Self-pay | Admitting: Urology

## 2019-04-30 VITALS — BP 156/100 | HR 65 | Ht 76.0 in | Wt 225.0 lb

## 2019-04-30 DIAGNOSIS — N200 Calculus of kidney: Secondary | ICD-10-CM | POA: Diagnosis not present

## 2019-04-30 DIAGNOSIS — Z9889 Other specified postprocedural states: Secondary | ICD-10-CM

## 2019-05-01 ENCOUNTER — Encounter: Payer: Self-pay | Admitting: Urology

## 2019-05-01 NOTE — Progress Notes (Signed)
04/30/2019 5:07 PM   Jonathan Janeece Agee Jr. [birth year] MD:[id number]  Referring provider: [place] Pink, MD [address] [facility] [place],  [place] [address]  Chief Complaint  Patient presents with  . Follow-up    HPI: 64 y.o. male presents for postoperative follow-up  -Status post cystolitholapaxy of a 3 cm bladder calculus 03/27/2019 -Catheter removed 1 day postop -Fairly significant irritative symptoms for 1 week which have improved significantly -CT also showed a 7 mm nonobstructing lower pole left renal calculus   PMH: Past Medical History:  Diagnosis Date  . Bladder stones   . GERD (gastroesophageal reflux disease)   . Hemorrhoids   . History of kidney stones     Surgical History: Past Surgical History:  Procedure Laterality Date  . CYSTOSCOPY WITH LITHOLAPAXY N/A 03/27/2019   Procedure: CYSTOSCOPY WITH LITHOLAPAXY;  Surgeon: Abbie Sons, MD;  Location: [facility] ORS;  Service: Urology;  Laterality: N/A;  . HAND SURGERY    . HAND SURGERY Bilateral   . TENNIS ELBOW RELEASE/NIRSCHEL PROCEDURE Bilateral 04/14/2017   Procedure: TENNIS ELBOW RELEASE/NIRSCHEL PROCEDURE;  Surgeon: Hessie Knows, MD;  Location: [facility] ORS;  Service: Orthopedics;  Laterality: Bilateral;  . TENNIS ELBOW RELEASE/NIRSCHEL PROCEDURE Right 01/03/2018   Procedure: TENNIS ELBOW RELEASE/NIRSCHEL PROCEDURE;  Surgeon: Hessie Knows, MD;  Location: [facility] ORS;  Service: Orthopedics;  Laterality: Right;  . TONSILLECTOMY      Home Medications:  Allergies as of 04/30/2019      Reactions   Penicillins Hives   Has patient had a PCN reaction causing immediate rash, facial/tongue/throat swelling, SOB or lightheadedness with hypotension: No Has patient had a PCN reaction causing severe rash involving mucus membranes or skin necrosis: No Has patient had a PCN reaction that required hospitalization: No Has patient had a PCN reaction occurring within the last 10 years: Unknown If all of the above answers  are "NO", then may proceed with Cephalosporin use.   Contrast Media [iodinated Diagnostic Agents] Other (See Comments)   migraine       Medication List       Accurate as of April 30, 2019 11:59 PM. If you have any questions, ask your nurse or doctor.        acetaminophen 325 MG tablet Commonly known as: TYLENOL Take 650 mg by mouth every 6 (six) hours as needed for moderate pain or headache.   azelastine 0.1 % nasal spray Commonly known as: ASTELIN Place 2 sprays into both nostrils daily as needed (vertigo). Use in each nostril as directed   HYDROcodone-acetaminophen 5-325 MG tablet Commonly known as: NORCO/VICODIN Take 1 tablet by mouth every 4 (four) hours as needed for moderate pain.   meclizine 25 MG tablet Commonly known as: ANTIVERT Take 25 mg by mouth 3 (three) times daily as needed (vertigo).   multivitamin with minerals Tabs tablet Take 1 tablet by mouth daily.   SAW PALMETTO PO Take 2 capsules by mouth daily.       Allergies:  Allergies  Allergen Reactions  . Penicillins Hives    Has patient had a PCN reaction causing immediate rash, facial/tongue/throat swelling, SOB or lightheadedness with hypotension: No Has patient had a PCN reaction causing severe rash involving mucus membranes or skin necrosis: No Has patient had a PCN reaction that required hospitalization: No Has patient had a PCN reaction occurring within the last 10 years: Unknown If all of the above answers are "NO", then may proceed with Cephalosporin use.   . Contrast Media [Iodinated  Diagnostic Agents] Other (See Comments)    migraine     Family History: Family History  Problem Relation Age of Onset  . Prostate cancer Father   . Bladder Cancer Neg Hx   . Kidney cancer Neg Hx     Social History:  reports that he quit smoking about 33 years ago. His smoking use included cigarettes. He has a 24.00 pack-year smoking history. He has never used smokeless tobacco. He reports current alcohol  use. He reports that he does not use drugs.   Physical Exam: BP (!) 156/100   Pulse 65   Ht 6\' 4"  (1.93 m)   Wt 225 lb (102.1 kg)   BMI 27.39 kg/m   Constitutional:  Alert and oriented, No acute distress. HEENT: [place] AT, moist mucus membranes.  Trachea midline, no masses. Cardiovascular: No clubbing, cyanosis, or edema. Respiratory: Normal respiratory effort, no increased work of breathing.  Assessment & Plan:    - Status post cystolitholapaxy Doing well.  - Left nephrolithiasis Management options were discussed including surveillance and shockwave lithotripsy.  I would not recommend ureteroscopy for an asymptomatic, nonobstructing stone.  Stone density is ~800 HU.  He would like to discuss these options with his wife and will call back with his decision.  I did recommend a metabolic evaluation since he is a recurrent stone former which he agreed.  He will be notified with those results and will schedule a tentative follow-up in 6 months with KUB.   Abbie Sons, [place] [address], [place], [place] [address] [phone]

## 2019-05-14 ENCOUNTER — Other Ambulatory Visit: Payer: BC Managed Care – PPO

## 2019-05-14 ENCOUNTER — Other Ambulatory Visit: Payer: Self-pay

## 2019-05-31 ENCOUNTER — Other Ambulatory Visit: Payer: Self-pay | Admitting: Urology

## 2019-07-31 ENCOUNTER — Encounter: Payer: Self-pay | Admitting: Emergency Medicine

## 2019-07-31 ENCOUNTER — Other Ambulatory Visit: Payer: Self-pay

## 2019-07-31 DIAGNOSIS — N132 Hydronephrosis with renal and ureteral calculous obstruction: Secondary | ICD-10-CM | POA: Diagnosis not present

## 2019-07-31 DIAGNOSIS — Z87891 Personal history of nicotine dependence: Secondary | ICD-10-CM | POA: Diagnosis not present

## 2019-07-31 DIAGNOSIS — Z79899 Other long term (current) drug therapy: Secondary | ICD-10-CM | POA: Diagnosis not present

## 2019-07-31 DIAGNOSIS — Z20822 Contact with and (suspected) exposure to covid-19: Secondary | ICD-10-CM | POA: Insufficient documentation

## 2019-07-31 DIAGNOSIS — R109 Unspecified abdominal pain: Secondary | ICD-10-CM | POA: Diagnosis present

## 2019-07-31 NOTE — ED Triage Notes (Signed)
Pt presents to ED with sudden onset of left sided groin pain since around 2130 this evening. Pt has hx of kidney stones and states he knows he has an 58mm stone in his left kidney. Pt restless. Denies difficulty urinating.

## 2019-08-01 ENCOUNTER — Other Ambulatory Visit: Payer: Self-pay | Admitting: Urology

## 2019-08-01 ENCOUNTER — Emergency Department: Payer: BC Managed Care – PPO

## 2019-08-01 ENCOUNTER — Ambulatory Visit
Admission: RE | Admit: 2019-08-01 | Discharge: 2019-08-01 | Disposition: A | Discharge status: Home / Self Care | Payer: BC Managed Care – PPO | Source: Home / Self Care | Attending: Urology | Admitting: Urology

## 2019-08-01 ENCOUNTER — Ambulatory Visit (INDEPENDENT_AMBULATORY_CARE_PROVIDER_SITE_OTHER): Payer: BC Managed Care – PPO | Admitting: Urology

## 2019-08-01 ENCOUNTER — Other Ambulatory Visit: Payer: Self-pay | Admitting: Radiology

## 2019-08-01 ENCOUNTER — Other Ambulatory Visit
Admission: RE | Admit: 2019-08-01 | Discharge: 2019-08-01 | Disposition: A | Discharge status: Home / Self Care | Payer: BC Managed Care – PPO | Source: Ambulatory Visit | Admitting: Urology

## 2019-08-01 ENCOUNTER — Emergency Department
Admission: EM | Admit: 2019-08-01 | Discharge: 2019-08-01 | Disposition: A | Discharge status: Home / Self Care | Payer: BC Managed Care – PPO | Attending: Emergency Medicine | Admitting: Emergency Medicine

## 2019-08-01 ENCOUNTER — Ambulatory Visit
Admission: RE | Admit: 2019-08-01 | Discharge: 2019-08-01 | Disposition: A | Discharge status: Home / Self Care | Payer: BC Managed Care – PPO | Source: Ambulatory Visit | Attending: Urology | Admitting: Urology

## 2019-08-01 ENCOUNTER — Encounter: Payer: Self-pay | Admitting: Urology

## 2019-08-01 VITALS — BP 132/78 | HR 80 | Ht 76.0 in | Wt 224.0 lb

## 2019-08-01 DIAGNOSIS — N132 Hydronephrosis with renal and ureteral calculous obstruction: Secondary | ICD-10-CM | POA: Diagnosis not present

## 2019-08-01 DIAGNOSIS — N201 Calculus of ureter: Secondary | ICD-10-CM

## 2019-08-01 DIAGNOSIS — R3129 Other microscopic hematuria: Secondary | ICD-10-CM

## 2019-08-01 LAB — CBC WITH DIFFERENTIAL/PLATELET
Abs Immature Granulocytes: 0.01 10*3/uL (ref 0.00–0.07)
Basophils Absolute: 0 10*3/uL (ref 0.0–0.1)
Basophils Relative: 0 %
Eosinophils Absolute: 0.1 10*3/uL (ref 0.0–0.5)
Eosinophils Relative: 2 %
HCT: 43.7 % (ref 39.0–52.0)
Hemoglobin: 15.3 g/dL (ref 13.0–17.0)
Immature Granulocytes: 0 %
Lymphocytes Relative: 34 %
Lymphs Abs: 2.4 10*3/uL (ref 0.7–4.0)
MCH: 33 pg (ref 26.0–34.0)
MCHC: 35 g/dL (ref 30.0–36.0)
MCV: 94.4 fL (ref 80.0–100.0)
Monocytes Absolute: 0.7 10*3/uL (ref 0.1–1.0)
Monocytes Relative: 9 %
Neutro Abs: 3.8 10*3/uL (ref 1.7–7.7)
Neutrophils Relative %: 55 %
Platelets: 202 10*3/uL (ref 150–400)
RBC: 4.63 MIL/uL (ref 4.22–5.81)
RDW: 12.1 % (ref 11.5–15.5)
WBC: 7 10*3/uL (ref 4.0–10.5)
nRBC: 0 % (ref 0.0–0.2)

## 2019-08-01 LAB — COMPREHENSIVE METABOLIC PANEL
ALT: 36 U/L (ref 0–44)
AST: 36 U/L (ref 15–41)
Albumin: 4.2 g/dL (ref 3.5–5.0)
Alkaline Phosphatase: 54 U/L (ref 38–126)
Anion gap: 8 (ref 5–15)
BUN: 21 mg/dL (ref 8–23)
CO2: 24 mmol/L (ref 22–32)
Calcium: 9.3 mg/dL (ref 8.9–10.3)
Chloride: 107 mmol/L (ref 98–111)
Creatinine, Ser: 1.21 mg/dL (ref 0.61–1.24)
GFR calc Af Amer: >60 mL/min (ref >60)
GFR calc non Af Amer: >60 mL/min (ref >60)
Glucose, Bld: 147 mg/dL — ABNORMAL HIGH (ref 70–99)
Potassium: 3.9 mmol/L (ref 3.5–5.1)
Sodium: 139 mmol/L (ref 135–145)
Total Bilirubin: 0.7 mg/dL (ref 0.3–1.2)
Total Protein: 7.4 g/dL (ref 6.5–8.1)

## 2019-08-01 LAB — URINALYSIS, COMPLETE (UACMP) WITH MICROSCOPIC
Bacteria, UA: NONE SEEN
Bilirubin Urine: NEGATIVE
Glucose, UA: NEGATIVE mg/dL
Ketones, ur: NEGATIVE mg/dL
Nitrite: NEGATIVE
Protein, ur: 30 mg/dL — AB
RBC / HPF: >50 RBC/hpf — ABNORMAL HIGH (ref 0–5)
Specific Gravity, Urine: 1.021 (ref 1.005–1.030)
pH: 5 (ref 5.0–8.0)

## 2019-08-01 LAB — LIPASE, BLOOD: Lipase: 43 U/L (ref 11–51)

## 2019-08-01 LAB — SARS CORONAVIRUS 2 (TAT 6-24 HRS): SARS Coronavirus 2: NEGATIVE

## 2019-08-01 MED ORDER — OXYCODONE-ACETAMINOPHEN 5-325 MG PO TABS
2.0000 | ORAL_TABLET | Freq: Once | ORAL | Status: AC
  Administered 2019-08-01: 2 via ORAL
  Filled 2019-08-01: qty 2

## 2019-08-01 MED ORDER — DOCUSATE SODIUM 100 MG PO CAPS: ORAL_CAPSULE | ORAL | 0 refills | Status: DC

## 2019-08-01 MED ORDER — TAMSULOSIN HCL 0.4 MG PO CAPS
ORAL_CAPSULE | ORAL | 0 refills | Status: DC
Start: 2019-08-01 — End: 2020-05-06

## 2019-08-01 MED ORDER — SODIUM CHLORIDE 0.9 % IV BOLUS
1000.0000 mL | Freq: Once | INTRAVENOUS | Status: AC
  Administered 2019-08-01: 1000 mL via INTRAVENOUS

## 2019-08-01 MED ORDER — OXYCODONE-ACETAMINOPHEN 5-325 MG PO TABS: 2.0000 | ORAL_TABLET | Freq: Four times a day (QID) | ORAL | 0 refills | Status: DC | PRN

## 2019-08-01 MED ORDER — MORPHINE SULFATE (PF) 4 MG/ML IV SOLN
4.0000 mg | Freq: Once | INTRAVENOUS | Status: AC
  Administered 2019-08-01: 4 mg via INTRAVENOUS
  Filled 2019-08-01: qty 1

## 2019-08-01 MED ORDER — ONDANSETRON 4 MG PO TBDP
ORAL_TABLET | ORAL | 0 refills | Status: DC
Start: 2019-08-01 — End: 2023-05-02

## 2019-08-01 MED ORDER — ONDANSETRON HCL 4 MG/2ML IJ SOLN
4.0000 mg | INTRAMUSCULAR | Status: AC
  Administered 2019-08-01: 4 mg via INTRAVENOUS
  Filled 2019-08-01: qty 2

## 2019-08-01 NOTE — ED Provider Notes (Signed)
[facility] Emergency Department Provider Note  ____________________________________________   First MD Initiated Contact with Patient 08/01/19 [phone]     (approximate)  I have reviewed the triage vital signs and the nursing notes.   HISTORY  Chief Complaint Groin Pain    HPI Jonathan Kiner. is a 64 y.o. male with a history of multiple kidney stones and bladder stone, 1 of which was a 3 cm stone requiring retrieval by Dr. Bernardo Heater from the bladder  about 5 months ago.  He has never had lithotripsy or ureteral stent.  He presents tonight for acute onset pain in his left groin.  It started around 9:30 PM and has been on and off but has gotten worse and was severe upon arrival.  He has not had any difficulty urinating.  He has had some nausea but no vomiting.  It feels a bit different from prior stones.  He has been told in the past that he either has inguinal hernias or has the possibility of developing them based on some prior imaging.  He has seen no blood in his urine.  He describes the pain as sharp and down in the left side of his groin only.  No testicular pain.  He denies fever/chills, sore throat, chest pain, vomiting, and upper abdominal pain.  Nothing in particular makes the pain better or worse.        Past Medical History:  Diagnosis Date  . Bladder stones   . GERD (gastroesophageal reflux disease)   . Hemorrhoids   . History of kidney stones     Patient Active Problem List   Diagnosis Date Noted  . Benign head tremor 11/16/2018  . GERD (gastroesophageal reflux disease) 09/18/2015  . Internal hemorrhoid, bleeding 06/30/2015  . History of urinary stone 07/30/2013  . Elevated prostate specific antigen (PSA) 01/26/2012    Past Surgical History:  Procedure Laterality Date  . CYSTOSCOPY WITH LITHOLAPAXY N/A 03/27/2019   Procedure: CYSTOSCOPY WITH LITHOLAPAXY;  Surgeon: Abbie Sons, MD;  Location: [facility] ORS;  Service: Urology;   Laterality: N/A;  . HAND SURGERY    . HAND SURGERY Bilateral   . TENNIS ELBOW RELEASE/NIRSCHEL PROCEDURE Bilateral 04/14/2017   Procedure: TENNIS ELBOW RELEASE/NIRSCHEL PROCEDURE;  Surgeon: Hessie Knows, MD;  Location: [facility] ORS;  Service: Orthopedics;  Laterality: Bilateral;  . TENNIS ELBOW RELEASE/NIRSCHEL PROCEDURE Right 01/03/2018   Procedure: TENNIS ELBOW RELEASE/NIRSCHEL PROCEDURE;  Surgeon: Hessie Knows, MD;  Location: [facility] ORS;  Service: Orthopedics;  Laterality: Right;  . TONSILLECTOMY      Prior to Admission medications   Medication Sig Start Date End Date Taking? Authorizing Provider  acetaminophen (TYLENOL) 325 MG tablet Take 650 mg by mouth every 6 (six) hours as needed for moderate pain or headache.    [provider]  azelastine (ASTELIN) 0.1 % nasal spray Place 2 sprays into both nostrils daily as needed (vertigo). Use in each nostril as directed    [provider]  docusate sodium (COLACE) 100 MG capsule Take 1 tablet once or twice daily as needed for constipation while taking narcotic pain medicine 08/01/19   Hinda Kehr, MD  HYDROcodone-acetaminophen (NORCO/VICODIN) 5-325 MG tablet Take 1 tablet by mouth every 4 (four) hours as needed for moderate pain. 03/27/19   Stoioff, Ronda Fairly, MD  meclizine (ANTIVERT) 25 MG tablet Take 25 mg by mouth 3 (three) times daily as needed (vertigo).    [provider]  Multiple Vitamin (MULTIVITAMIN WITH MINERALS) TABS tablet Take  1 tablet by mouth daily.    [provider]  ondansetron (ZOFRAN ODT) 4 MG disintegrating tablet Allow 1-2 tablets to dissolve in your mouth every 8 hours as needed for nausea/vomiting 08/01/19   Hinda Kehr, MD  oxyCODONE-acetaminophen (PERCOCET) 5-325 MG tablet Take 2 tablets by mouth every 6 (six) hours as needed for severe pain. 08/01/19   Hinda Kehr, MD  Saw Palmetto, Serenoa repens, (SAW PALMETTO PO) Take 2 capsules by mouth daily.    [provider]  tamsulosin  (FLOMAX) 0.4 MG CAPS capsule Take 1 tablet by mouth daily until you pass the kidney stone or no longer have symptoms 08/01/19   Hinda Kehr, MD    Allergies Penicillins and Contrast media [iodinated diagnostic agents]  Family History  Problem Relation Age of Onset  . Prostate cancer Father   . Bladder Cancer Neg Hx   . Kidney cancer Neg Hx     Social History Social History   Tobacco Use  . Smoking status: Former Smoker    Packs/day: 2.00    Years: 12.00    Pack years: 24.00    Types: Cigarettes    Quit date: 03/24/1986    Years since quitting: 33.3  . Smokeless tobacco: Never Used  Vaping Use  . Vaping Use: Never used  Substance Use Topics  . Alcohol use: Yes    Alcohol/week: 0.0 standard drinks    Comment: rarely  . Drug use: No    Review of Systems Constitutional: No fever/chills Eyes: No visual changes. ENT: No sore throat. Cardiovascular: Denies chest pain. Respiratory: Denies shortness of breath. Gastrointestinal: Left lower quadrant versus left inguinal pain.  Nausea, no vomiting.  No diarrhea.  No constipation. Genitourinary: Left groin pain.  No testicular pain.  Negative for dysuria.  Negative for hematuria. Musculoskeletal: Negative for neck pain.  Negative for back pain. Integumentary: Negative for rash. Neurological: Negative for headaches, focal weakness or numbness.   ____________________________________________   PHYSICAL EXAM:  VITAL SIGNS: ED Triage Vitals  Enc Vitals Group     BP 07/31/19 2317 (!) 153/94     Pulse Rate 07/31/19 2317 (!) 58     Resp 07/31/19 2317 18     Temp 07/31/19 2317 (!) 97.5 F (36.4 C)     Temp Source 07/31/19 2317 Oral     SpO2 07/31/19 2317 99 %     Weight 07/31/19 2317 101.6 kg (224 lb)     Height 07/31/19 2317 1.93 m (6\' 4" )     Head Circumference --      Peak Flow --      Pain Score 07/31/19 2328 9     Pain Loc --      Pain Edu? --      Excl. in [place]? --     Constitutional: Alert and oriented.  Appears  uncomfortable. Eyes: Conjunctivae are normal.  Head: Atraumatic. Nose: No congestion/rhinnorhea. Mouth/Throat: Patient is wearing a mask. Neck: No stridor.  No meningeal signs.   Cardiovascular: Normal rate, regular rhythm. Good peripheral circulation. Grossly normal heart sounds. Respiratory: Normal respiratory effort.  No retractions. Gastrointestinal: Soft with mild TTP of the LLQ. Genitourinary: Tender to palpation in the left inguinal region but no palpable mass, skin color changes, or indurated/firm tubular structure that would suggest an incarcerated or strangulated hernia.  External male genitalia otherwise within normal limits. Musculoskeletal: No lower extremity tenderness nor edema. No gross deformities of extremities. Neurologic:  Normal speech and language. No gross focal neurologic deficits are  appreciated.  Skin:  Skin is warm, dry and intact. Psychiatric: Mood and affect are normal. Speech and behavior are normal.  ____________________________________________   LABS (all labs ordered are listed, but only abnormal results are displayed)  Labs Reviewed  COMPREHENSIVE METABOLIC PANEL - Abnormal; Notable for the following components:      Result Value   Glucose, Bld 147 (*)    All other components within normal limits  URINALYSIS, COMPLETE (UACMP) WITH MICROSCOPIC - Abnormal; Notable for the following components:   Color, Urine YELLOW (*)    APPearance HAZY (*)    Hgb urine dipstick SMALL (*)    Protein, ur 30 (*)    Leukocytes,Ua TRACE (*)    RBC / HPF >50 (*)    All other components within normal limits  CBC WITH DIFFERENTIAL/PLATELET  LIPASE, BLOOD   ____________________________________________  EKG  No indication for emergent EKG ____________________________________________  RADIOLOGY Ursula Alert, personally viewed and evaluated these images (plain radiographs) as part of my medical decision making, as well as reviewing the written report by the  radiologist.  ED MD interpretation: 11 mm left mid ureteral stone with hydronephrosis.  Official radiology report(s): CT Renal Stone Study  Result Date: 08/01/2019 CLINICAL DATA:  Flank pain.  Kidney stone suspected. EXAM: CT ABDOMEN AND PELVIS WITHOUT CONTRAST TECHNIQUE: Multidetector CT imaging of the abdomen and pelvis was performed following the standard protocol without IV contrast. COMPARISON:  CT abdomen and pelvis without contrast 02/22/2019 and 06/11/2012. FINDINGS: Lower chest: The lung bases are clear without focal nodule, mass, or airspace disease. Heart size is normal. Pectus excavatum is incidentally noted. Hepatobiliary: No focal liver abnormality is seen. No gallstones, gallbladder wall thickening, or biliary dilatation. Pancreas: Unremarkable. No pancreatic ductal dilatation or surrounding inflammatory changes. Spleen: Normal in size without focal abnormality. Adrenals/Urinary Tract: Adrenal glands are normal bilaterally. Low-density lesions in both kidneys are not significantly changed, likely representing renal cysts. Punctate nonobstructing stones in the right kidney are stable. Moderate left-sided hydro nephrosis is new, secondary to an obstructing 11 mm stone in the mid left ureter. Marked inflammatory changes are present about the ureter. Distal ureter is mildly dilated without additional obstruction. The urinary bladder is within normal limits. Stomach/Bowel: Small hiatal hernia is present. Duodenal diverticulum is again noted. No inflammatory changes are evident. Stomach and duodenum are otherwise normal. Small bowel is unremarkable. Terminal ileum is within normal limits. The appendix is visualized and normal. The ascending and transverse colon are within normal limits. Diverticular changes are present in the distal descending and sigmoid colon without inflammation to suggest diverticulitis. Vascular/Lymphatic: Atherosclerotic calcifications are again noted without aneurysm or focal  stenosis. No significant retroperitoneal adenopathy is present. Reproductive: Prostate is unremarkable. Other: Fat herniates into the inguinal canals bilaterally. There is some stranding of fat herniating into the umbilical hernia. No associated bowel is present. No significant free fluid or free air is present. Musculoskeletal: Vertebral body heights alignment are maintained. No focal lytic or blastic lesions are present. Bony pelvis is normal. Hips are located and within normal limits bilaterally. IMPRESSION: 1. Obstructing 11 mm stone in the mid left ureter with moderate left-sided hydronephrosis and marked inflammatory changes about the ureter. 2. Stable punctate nonobstructing stones in the right kidney. 3. Stable bilateral renal cysts. 4. Descending and sigmoid diverticulosis without diverticulitis. 5. Fat herniates into the inguinal canals bilaterally. There is some stranding of fat herniating into the umbilical hernia. No associated bowel is present. 6. Aortic Atherosclerosis (ICD10-I70.0). Electronically Signed  By: San Morelle M.D.   On: 08/01/2019 04:15    ____________________________________________   PROCEDURES   Procedure(s) performed (including Critical Care):  Procedures   ____________________________________________   INITIAL IMPRESSION / MDM / [place] / ED COURSE  As part of my medical decision making, I reviewed the following data within the [facility] notes reviewed and incorporated, Labs reviewed , Old chart reviewed, Discussed with urologist (Dr. Bernardo Heater), Notes from prior ED visits and [place] Controlled Substance Database   Differential diagnosis includes, but is not limited to, renal colic, obstructive uropathy, UTI/pyelonephritis with or without an infected stone, inguinal hernia.  Patient symptoms are most consistent with ureteral colic but the location of his pain in his left groin could also suggest a hernia.  He says that  he has swelling in that area and and it is tender to palpation.  CT renal stone protocol, however, indicates an 11 mm left mid ureteral stone which I believe is the source of most of his issues.  It also indicates some degree of fat-containing hernias bilaterally of which the patient reports he was already aware, but no evidence of strangulation or incarceration of bowel.  Lab work is reassuring with an essentially normal comprehensive metabolic panel and CBC.  Patient has not yet provided a urine specimen but we will obtain 1 to rule out acute infection.  Given the size of the stone and the inflammatory changes, I spoke by phone with Dr. Bernardo Heater to see if he felt the patient would be appropriate for lithotripsy in 2 days.  He thought this would be an appropriate method of treatment so I had my usual and customary lithotripsy preparation discussion with the patient and his wife and I sent a message through [facility] to Dr. Bernardo Heater and Dr. Erlene Quan to make them aware of the patient.  Dr. Bernardo Heater said that someone from the clinic will reach out to the patient later today to schedule an appointment and I verified that the phone numbers in the system for the patient are correct.  I am giving the patient morphine 4 mg IV and Zofran 4 mg IV as well as 2 Percocet by mouth to help get his pain under control and then he should be appropriate for discharge and outpatient follow-up.       Clinical Course as of Aug 01 738  Wed Aug 01, 2019  0732 The patient is pain-free.  Urinalysis indicates hematuria but no evidence of active infection.  He is comfortable with the plan for discharge home and follow-up with urology for probable lithotripsy on Thursday.  I gave my usual and customary return precautions.   [CF]    Clinical Course User Index [CF] Hinda Kehr, MD     ____________________________________________  FINAL CLINICAL IMPRESSION(S) / ED DIAGNOSES  Final diagnoses:  Ureteral stone with hydronephrosis      MEDICATIONS GIVEN DURING THIS VISIT:  Medications  ondansetron (ZOFRAN) injection 4 mg (4 mg Intravenous Given 08/01/19 0453)  morphine 4 MG/ML injection 4 mg (4 mg Intravenous Given 08/01/19 0454)  sodium chloride 0.9 % bolus 1,000 mL (1,000 mLs Intravenous New Bag/Given 08/01/19 0532)  oxyCODONE-acetaminophen (PERCOCET/ROXICET) 5-325 MG per tablet 2 tablet (2 tablets Oral Given 08/01/19 0532)     ED Discharge Orders         Ordered    oxyCODONE-acetaminophen (PERCOCET) 5-325 MG tablet  Every 6 hours PRN     Discontinue  Reprint     08/01/19 0734  tamsulosin (FLOMAX) 0.4 MG CAPS capsule     Discontinue  Reprint     08/01/19 0734    ondansetron (ZOFRAN ODT) 4 MG disintegrating tablet     Discontinue  Reprint     08/01/19 0734    docusate sodium (COLACE) 100 MG capsule     Discontinue  Reprint     08/01/19 0734          *Please note:  Jonathan Blackwater. was evaluated in Emergency Department on 08/01/2019 for the symptoms described in the history of present illness. He was evaluated in the context of the global COVID-19 pandemic, which necessitated consideration that the patient might be at risk for infection with the SARS-CoV-2 virus that causes COVID-19. Institutional protocols and algorithms that pertain to the evaluation of patients at risk for COVID-19 are in a state of rapid change based on information released by regulatory bodies including the CDC and federal and state organizations. These policies and algorithms were followed during the patient's care in the ED.  Some ED evaluations and interventions may be delayed as a result of limited staffing during and after the pandemic.*  Note:  This document was prepared using Dragon voice recognition software and may include unintentional dictation errors.   Hinda Kehr, MD 08/01/19 [phone]

## 2019-08-01 NOTE — Discharge Instructions (Signed)
You have been seen in the Emergency Department (ED) today for pain caused by kidney stones.  As we have discussed, please drink plenty of fluids.  Please make a follow up appointment with the physician(s) listed elsewhere in this documentation.  You may take pain medication as needed but ONLY as prescribed.  Please also take your prescribed Flomax daily.  Avoid taking NSAIDs (ibuprofen, aspirin, naproxen, etc) until your urologist tells you it is okay to do so.  Do not drink alcohol, drive or participate in any other potentially dangerous activities while taking opiate pain medication as it may make you sleepy. Do not take this medication with any other sedating medications, either prescription or over-the-counter. If you were prescribed Percocet or Vicodin, do not take these with acetaminophen (Tylenol) as it is already contained within these medications.   Take Percocet as needed for severe pain.  This medication is an opiate (or narcotic) pain medication and can be habit forming.  Use it as little as possible to achieve adequate pain control.  Do not use or use it with extreme caution if you have a history of opiate abuse or dependence.  If you are on a pain contract with your primary care doctor or a pain specialist, be sure to let them know you were prescribed this medication today from the [facility] Emergency Department.  This medication is intended for your use only - do not give any to anyone else and keep it in a secure place where nobody else, especially children, have access to it.  It will also cause or worsen constipation, so you may want to consider taking an over-the-counter stool softener while you are taking this medication.  Return to the Emergency Department (ED) or call your doctor if you have any worsening pain, fever, painful urination, are unable to urinate, or develop other symptoms that concern you.

## 2019-08-01 NOTE — ED Notes (Signed)
ED Provider at bedside. 

## 2019-08-01 NOTE — H&P (View-Only) (Signed)
08/01/2019 3:22 PM   Jonathan Kind Jr. January 09, 1956 [id number]  Referring provider: Maryland Pink, MD [address] [facility] [place],  [place] [address]  Chief Complaint  Patient presents with  . Nephrolithiasis    HPI: 64 y.o. male with nephrolithiasis presents today for follow up after ED visit.  He presented to the emergency room early this a.m. for the acute onset of pain in his left groin.  It was associated with nausea.  CT renal stone study dated 08/01/2019 noted an obstructing 11 mm stone in the mid left ureter associated with moderate left-sided hydronephrosis and marketed inflammatory changes around the ureter.   Labs in the ED: White blood cell count was 7.0, serum creatinine was 1.21 and urinalysis noted greater than 50 RBCs, 11-20 WBCs, but nitrite negative.     Meds given in the ED: He is given Colace, Zofran, Percocet and Flomax  Prior urological history:  Status post cystolitholapaxy of a 3 cm bladder calculus 03/27/2019 -Catheter removed 1 day postop -Fairly significant irritative symptoms for 1 week which have improved significantly -CT also showed a 7 mm nonobstructing lower pole left renal calculus  Current NSAID/anticoagulation:   None   Today, he continues to have left-sided groin pain and nausea.  Patient denies any modifying or aggravating factors.  Patient denies any gross hematuria, dysuria or suprapubic/flank pain.  Patient denies any fevers, chills, nausea or vomiting.   KUB 11 mm stone just above the transverse process of L4 on the left.     PMH: Past Medical History:  Diagnosis Date  . Bladder stones   . GERD (gastroesophageal reflux disease)   . Hemorrhoids   . History of kidney stones     Surgical History: Past Surgical History:  Procedure Laterality Date  . CYSTOSCOPY WITH LITHOLAPAXY N/A 03/27/2019   Procedure: CYSTOSCOPY WITH LITHOLAPAXY;  Surgeon: Abbie Sons, MD;  Location: [facility] ORS;  Service: Urology;  Laterality:  N/A;  . HAND SURGERY    . HAND SURGERY Bilateral   . TENNIS ELBOW RELEASE/NIRSCHEL PROCEDURE Bilateral 04/14/2017   Procedure: TENNIS ELBOW RELEASE/NIRSCHEL PROCEDURE;  Surgeon: Hessie Knows, MD;  Location: [facility] ORS;  Service: Orthopedics;  Laterality: Bilateral;  . TENNIS ELBOW RELEASE/NIRSCHEL PROCEDURE Right 01/03/2018   Procedure: TENNIS ELBOW RELEASE/NIRSCHEL PROCEDURE;  Surgeon: Hessie Knows, MD;  Location: [facility] ORS;  Service: Orthopedics;  Laterality: Right;  . TONSILLECTOMY      Home Medications:  Allergies as of 08/01/2019      Reactions   Penicillins Hives   Has patient had a PCN reaction causing immediate rash, facial/tongue/throat swelling, SOB or lightheadedness with hypotension: No Has patient had a PCN reaction causing severe rash involving mucus membranes or skin necrosis: No Has patient had a PCN reaction that required hospitalization: No Has patient had a PCN reaction occurring within the last 10 years: Unknown If all of the above answers are "NO", then may proceed with Cephalosporin use.   Contrast Media [iodinated Diagnostic Agents] Other (See Comments)   migraine       Medication List       Accurate as of August 01, 2019  3:22 PM. If you have any questions, ask your nurse or doctor.        acetaminophen 325 MG tablet Commonly known as: TYLENOL Take 650 mg by mouth every 6 (six) hours as needed for moderate pain or headache.   azelastine 0.1 % nasal spray Commonly known as: ASTELIN Place 2 sprays into both nostrils  daily as needed (vertigo). Use in each nostril as directed   docusate sodium 100 MG capsule Commonly known as: Colace Take 1 tablet once or twice daily as needed for constipation while taking narcotic pain medicine   HYDROcodone-acetaminophen 5-325 MG tablet Commonly known as: NORCO/VICODIN Take 1 tablet by mouth every 4 (four) hours as needed for moderate pain.   meclizine 25 MG tablet Commonly known as: ANTIVERT Take 25 mg by mouth 3  (three) times daily as needed (vertigo).   multivitamin with minerals Tabs tablet Take 1 tablet by mouth daily.   ondansetron 4 MG disintegrating tablet Commonly known as: Zofran ODT Allow 1-2 tablets to dissolve in your mouth every 8 hours as needed for nausea/vomiting   oxyCODONE-acetaminophen 5-325 MG tablet Commonly known as: Percocet Take 2 tablets by mouth every 6 (six) hours as needed for severe pain.   SAW PALMETTO PO Take 2 capsules by mouth daily.   tamsulosin 0.4 MG Caps capsule Commonly known as: FLOMAX Take 1 tablet by mouth daily until you pass the kidney stone or no longer have symptoms       Allergies:  Allergies  Allergen Reactions  . Penicillins Hives    Has patient had a PCN reaction causing immediate rash, facial/tongue/throat swelling, SOB or lightheadedness with hypotension: No Has patient had a PCN reaction causing severe rash involving mucus membranes or skin necrosis: No Has patient had a PCN reaction that required hospitalization: No Has patient had a PCN reaction occurring within the last 10 years: Unknown If all of the above answers are "NO", then may proceed with Cephalosporin use.   . Contrast Media [Iodinated Diagnostic Agents] Other (See Comments)    migraine     Family History: Family History  Problem Relation Age of Onset  . Prostate cancer Father   . Bladder Cancer Neg Hx   . Kidney cancer Neg Hx     Social History:  reports that he quit smoking about 33 years ago. His smoking use included cigarettes. He has a 24.00 pack-year smoking history. He has never used smokeless tobacco. He reports current alcohol use. He reports that he does not use drugs.   Physical Exam: BP 132/78   Pulse 80   Ht 6\' 4"  (1.93 m)   Wt 224 lb (101.6 kg)   BMI 27.27 kg/m   Constitutional:  Well nourished. Alert and oriented, No acute distress. HEENT: [place] AT, mask in place.  Trachea midline Cardiovascular: No clubbing, cyanosis, or edema. Respiratory:  Normal respiratory effort, no increased work of breathing. Neurologic: Grossly intact, no focal deficits, moving all 4 extremities. Psychiatric: Normal mood and affect.  Laboratory Data: Results for orders placed or performed during the hospital encounter of 08/01/19  CBC with Differential  Result Value Ref Range   WBC 7.0 4.0 - 10.5 K/uL   RBC 4.63 4.22 - 5.81 MIL/uL   Hemoglobin 15.3 13.0 - 17.0 g/dL   HCT 43.7 39 - 52 %   MCV 94.4 80.0 - 100.0 fL   MCH 33.0 26.0 - 34.0 pg   MCHC 35.0 30.0 - 36.0 g/dL   RDW 12.1 11.5 - 15.5 %   Platelets 202 150 - 400 K/uL   nRBC 0.0 0.0 - 0.2 %   Neutrophils Relative % 55 %   Neutro Abs 3.8 1.7 - 7.7 K/uL   Lymphocytes Relative 34 %   Lymphs Abs 2.4 0.7 - 4.0 K/uL   Monocytes Relative 9 %   Monocytes Absolute 0.7 0 -  1 K/uL   Eosinophils Relative 2 %   Eosinophils Absolute 0.1 0 - 0 K/uL   Basophils Relative 0 %   Basophils Absolute 0.0 0 - 0 K/uL   Immature Granulocytes 0 %   Abs Immature Granulocytes 0.01 0.00 - 0.07 K/uL  Comprehensive metabolic panel  Result Value Ref Range   Sodium 139 135 - 145 mmol/L   Potassium 3.9 3.5 - 5.1 mmol/L   Chloride 107 98 - 111 mmol/L   CO2 24 22 - 32 mmol/L   Glucose, Bld 147 (H) 70 - 99 mg/dL   BUN 21 8 - 23 mg/dL   Creatinine, Ser 1.21 0.61 - 1.24 mg/dL   Calcium 9.3 8.9 - 10.3 mg/dL   Total Protein 7.4 6.5 - 8.1 g/dL   Albumin 4.2 3.5 - 5.0 g/dL   AST 36 15 - 41 U/L   ALT 36 0 - 44 U/L   Alkaline Phosphatase 54 38 - 126 U/L   Total Bilirubin 0.7 0.3 - 1.2 mg/dL   GFR calc non Af Amer >60 >60 mL/min   GFR calc Af Amer >60 >60 mL/min   Anion gap 8 5 - 15  Lipase, blood  Result Value Ref Range   Lipase 43 11 - 51 U/L  Urinalysis, Complete w Microscopic  Result Value Ref Range   Color, Urine YELLOW (A) YELLOW   APPearance HAZY (A) CLEAR   Specific Gravity, Urine 1.021 1.005 - 1.030   pH 5.0 5.0 - 8.0   Glucose, UA NEGATIVE NEGATIVE mg/dL   Hgb urine dipstick SMALL (A) NEGATIVE    Bilirubin Urine NEGATIVE NEGATIVE   Ketones, ur NEGATIVE NEGATIVE mg/dL   Protein, ur 30 (A) NEGATIVE mg/dL   Nitrite NEGATIVE NEGATIVE   Leukocytes,Ua TRACE (A) NEGATIVE   RBC / HPF >50 (H) 0 - 5 RBC/hpf   WBC, UA 11-20 0 - 5 WBC/hpf   Bacteria, UA NONE SEEN NONE SEEN   Squamous Epithelial / LPF 0-5 0 - 5   Mucus PRESENT    Ca Oxalate Crys, UA PRESENT     Pertinent Imaging  See Epic.  I have independently reviewed the films.  See HPI Assessment & Plan:    1. Left ureteral stone Explained to the patient that since their stone is >10 mm, I explained that URS would be the first lined therapy if stone(s) do not pass, but ESWL is the procedure with the least morbidity and lowest complication rate, but URS has a greater stone-free rate in a single procedure He would like to proceed with left ESWL (SD 1200 HU and skin to stone distance 16 cm) Explained to the patient that with ESWL, shock waves are focused on the stone using X-rays to pinpoint the stone.  The shock waves are fired repeatedly which usually causes the stone to break into small pieces which pass out in the urine over the next few weeks.  They will go home that day and may be able to resume normal activities in three days.  They should be given a strainer and they need to collect any fragments/sediments that they pass for analysis.   Risks involved with the procedure consist of bruising of the skin and kidney, possible long-term kidney damage, development of HTN, damage to the bowel, spleen, liver, pancreas, male organs or lungs, hematuria, hematuria serious enough to require transfusion or surgical repair, formation of a hematoma, infection or sepsis.  If the stone is too large or dense, it may not break  apart or break apart in large pieces and cause "Steinstrasse."  If this happens, it would result in the need for another procedure (likely URS/LL/stent placement).  IV sedation is typically used, but in rare instances general  anesthesia is used with the risk of irregular heart beat, irregular BP, stroke, MI, CVA, paralysis, coma and/or death.   2. Left hydronephrosis Will obtain RUS to ensure the hydronephrosis has resolved once they have passed and/or recovered from procedure to ensure that no iatrogenic hydronephrosis remains  3. Microscopic hematuria UA today demonstrates > 50 RBC's (from the ED) Continue to monitor the patient's UA after the treatment/passage of the stone to ensure the hematuria has resolved If hematuria persists, we will pursue a hematuria workup with CT Urogram and cystoscopy if appropriate.    Zara Council, PA-C  [facility] [address] [place], [place] [address] [phone]

## 2019-08-01 NOTE — Progress Notes (Signed)
08/01/2019 3:22 PM   Jonathan Kind Jr. 1955/02/14 [id number]  Referring provider: Maryland Pink, MD [address] [facility] [place],  [place] [address]  Chief Complaint  Patient presents with  . Nephrolithiasis    HPI: 64 y.o. male with nephrolithiasis presents today for follow up after ED visit.  He presented to the emergency room early this a.m. for the acute onset of pain in his left groin.  It was associated with nausea.  CT renal stone study dated 08/01/2019 noted an obstructing 11 mm stone in the mid left ureter associated with moderate left-sided hydronephrosis and marketed inflammatory changes around the ureter.   Labs in the ED: White blood cell count was 7.0, serum creatinine was 1.21 and urinalysis noted greater than 50 RBCs, 11-20 WBCs, but nitrite negative.     Meds given in the ED: He is given Colace, Zofran, Percocet and Flomax  Prior urological history:  Status post cystolitholapaxy of a 3 cm bladder calculus 03/27/2019 -Catheter removed 1 day postop -Fairly significant irritative symptoms for 1 week which have improved significantly -CT also showed a 7 mm nonobstructing lower pole left renal calculus  Current NSAID/anticoagulation:   None   Today, he continues to have left-sided groin pain and nausea.  Patient denies any modifying or aggravating factors.  Patient denies any gross hematuria, dysuria or suprapubic/flank pain.  Patient denies any fevers, chills, nausea or vomiting.   KUB 11 mm stone just above the transverse process of L4 on the left.     PMH: Past Medical History:  Diagnosis Date  . Bladder stones   . GERD (gastroesophageal reflux disease)   . Hemorrhoids   . History of kidney stones     Surgical History: Past Surgical History:  Procedure Laterality Date  . CYSTOSCOPY WITH LITHOLAPAXY N/A 03/27/2019   Procedure: CYSTOSCOPY WITH LITHOLAPAXY;  Surgeon: Abbie Sons, MD;  Location: [facility] ORS;  Service: Urology;  Laterality:  N/A;  . HAND SURGERY    . HAND SURGERY Bilateral   . TENNIS ELBOW RELEASE/NIRSCHEL PROCEDURE Bilateral 04/14/2017   Procedure: TENNIS ELBOW RELEASE/NIRSCHEL PROCEDURE;  Surgeon: Hessie Knows, MD;  Location: [facility] ORS;  Service: Orthopedics;  Laterality: Bilateral;  . TENNIS ELBOW RELEASE/NIRSCHEL PROCEDURE Right 01/03/2018   Procedure: TENNIS ELBOW RELEASE/NIRSCHEL PROCEDURE;  Surgeon: Hessie Knows, MD;  Location: [facility] ORS;  Service: Orthopedics;  Laterality: Right;  . TONSILLECTOMY      Home Medications:  Allergies as of 08/01/2019      Reactions   Penicillins Hives   Has patient had a PCN reaction causing immediate rash, facial/tongue/throat swelling, SOB or lightheadedness with hypotension: No Has patient had a PCN reaction causing severe rash involving mucus membranes or skin necrosis: No Has patient had a PCN reaction that required hospitalization: No Has patient had a PCN reaction occurring within the last 10 years: Unknown If all of the above answers are "NO", then may proceed with Cephalosporin use.   Contrast Media [iodinated Diagnostic Agents] Other (See Comments)   migraine       Medication List       Accurate as of August 01, 2019  3:22 PM. If you have any questions, ask your nurse or doctor.        acetaminophen 325 MG tablet Commonly known as: TYLENOL Take 650 mg by mouth every 6 (six) hours as needed for moderate pain or headache.   azelastine 0.1 % nasal spray Commonly known as: ASTELIN Place 2 sprays into both nostrils  daily as needed (vertigo). Use in each nostril as directed   docusate sodium 100 MG capsule Commonly known as: Colace Take 1 tablet once or twice daily as needed for constipation while taking narcotic pain medicine   HYDROcodone-acetaminophen 5-325 MG tablet Commonly known as: NORCO/VICODIN Take 1 tablet by mouth every 4 (four) hours as needed for moderate pain.   meclizine 25 MG tablet Commonly known as: ANTIVERT Take 25 mg by mouth 3  (three) times daily as needed (vertigo).   multivitamin with minerals Tabs tablet Take 1 tablet by mouth daily.   ondansetron 4 MG disintegrating tablet Commonly known as: Zofran ODT Allow 1-2 tablets to dissolve in your mouth every 8 hours as needed for nausea/vomiting   oxyCODONE-acetaminophen 5-325 MG tablet Commonly known as: Percocet Take 2 tablets by mouth every 6 (six) hours as needed for severe pain.   SAW PALMETTO PO Take 2 capsules by mouth daily.   tamsulosin 0.4 MG Caps capsule Commonly known as: FLOMAX Take 1 tablet by mouth daily until you pass the kidney stone or no longer have symptoms       Allergies:  Allergies  Allergen Reactions  . Penicillins Hives    Has patient had a PCN reaction causing immediate rash, facial/tongue/throat swelling, SOB or lightheadedness with hypotension: No Has patient had a PCN reaction causing severe rash involving mucus membranes or skin necrosis: No Has patient had a PCN reaction that required hospitalization: No Has patient had a PCN reaction occurring within the last 10 years: Unknown If all of the above answers are "NO", then may proceed with Cephalosporin use.   . Contrast Media [Iodinated Diagnostic Agents] Other (See Comments)    migraine     Family History: Family History  Problem Relation Age of Onset  . Prostate cancer Father   . Bladder Cancer Neg Hx   . Kidney cancer Neg Hx     Social History:  reports that he quit smoking about 33 years ago. His smoking use included cigarettes. He has a 24.00 pack-year smoking history. He has never used smokeless tobacco. He reports current alcohol use. He reports that he does not use drugs.   Physical Exam: BP 132/78   Pulse 80   Ht 6\' 4"  (1.93 m)   Wt 224 lb (101.6 kg)   BMI 27.27 kg/m   Constitutional:  Well nourished. Alert and oriented, No acute distress. HEENT: [place] AT, mask in place.  Trachea midline Cardiovascular: No clubbing, cyanosis, or edema. Respiratory:  Normal respiratory effort, no increased work of breathing. Neurologic: Grossly intact, no focal deficits, moving all 4 extremities. Psychiatric: Normal mood and affect.  Laboratory Data: Results for orders placed or performed during the hospital encounter of 08/01/19  CBC with Differential  Result Value Ref Range   WBC 7.0 4.0 - 10.5 K/uL   RBC 4.63 4.22 - 5.81 MIL/uL   Hemoglobin 15.3 13.0 - 17.0 g/dL   HCT 43.7 39 - 52 %   MCV 94.4 80.0 - 100.0 fL   MCH 33.0 26.0 - 34.0 pg   MCHC 35.0 30.0 - 36.0 g/dL   RDW 12.1 11.5 - 15.5 %   Platelets 202 150 - 400 K/uL   nRBC 0.0 0.0 - 0.2 %   Neutrophils Relative % 55 %   Neutro Abs 3.8 1.7 - 7.7 K/uL   Lymphocytes Relative 34 %   Lymphs Abs 2.4 0.7 - 4.0 K/uL   Monocytes Relative 9 %   Monocytes Absolute 0.7 0 -  1 K/uL   Eosinophils Relative 2 %   Eosinophils Absolute 0.1 0 - 0 K/uL   Basophils Relative 0 %   Basophils Absolute 0.0 0 - 0 K/uL   Immature Granulocytes 0 %   Abs Immature Granulocytes 0.01 0.00 - 0.07 K/uL  Comprehensive metabolic panel  Result Value Ref Range   Sodium 139 135 - 145 mmol/L   Potassium 3.9 3.5 - 5.1 mmol/L   Chloride 107 98 - 111 mmol/L   CO2 24 22 - 32 mmol/L   Glucose, Bld 147 (H) 70 - 99 mg/dL   BUN 21 8 - 23 mg/dL   Creatinine, Ser 1.21 0.61 - 1.24 mg/dL   Calcium 9.3 8.9 - 10.3 mg/dL   Total Protein 7.4 6.5 - 8.1 g/dL   Albumin 4.2 3.5 - 5.0 g/dL   AST 36 15 - 41 U/L   ALT 36 0 - 44 U/L   Alkaline Phosphatase 54 38 - 126 U/L   Total Bilirubin 0.7 0.3 - 1.2 mg/dL   GFR calc non Af Amer >60 >60 mL/min   GFR calc Af Amer >60 >60 mL/min   Anion gap 8 5 - 15  Lipase, blood  Result Value Ref Range   Lipase 43 11 - 51 U/L  Urinalysis, Complete w Microscopic  Result Value Ref Range   Color, Urine YELLOW (A) YELLOW   APPearance HAZY (A) CLEAR   Specific Gravity, Urine 1.021 1.005 - 1.030   pH 5.0 5.0 - 8.0   Glucose, UA NEGATIVE NEGATIVE mg/dL   Hgb urine dipstick SMALL (A) NEGATIVE    Bilirubin Urine NEGATIVE NEGATIVE   Ketones, ur NEGATIVE NEGATIVE mg/dL   Protein, ur 30 (A) NEGATIVE mg/dL   Nitrite NEGATIVE NEGATIVE   Leukocytes,Ua TRACE (A) NEGATIVE   RBC / HPF >50 (H) 0 - 5 RBC/hpf   WBC, UA 11-20 0 - 5 WBC/hpf   Bacteria, UA NONE SEEN NONE SEEN   Squamous Epithelial / LPF 0-5 0 - 5   Mucus PRESENT    Ca Oxalate Crys, UA PRESENT     Pertinent Imaging  See Epic.  I have independently reviewed the films.  See HPI Assessment & Plan:    1. Left ureteral stone Explained to the patient that since their stone is >10 mm, I explained that URS would be the first lined therapy if stone(s) do not pass, but ESWL is the procedure with the least morbidity and lowest complication rate, but URS has a greater stone-free rate in a single procedure He would like to proceed with left ESWL (SD 1200 HU and skin to stone distance 16 cm) Explained to the patient that with ESWL, shock waves are focused on the stone using X-rays to pinpoint the stone.  The shock waves are fired repeatedly which usually causes the stone to break into small pieces which pass out in the urine over the next few weeks.  They will go home that day and may be able to resume normal activities in three days.  They should be given a strainer and they need to collect any fragments/sediments that they pass for analysis.   Risks involved with the procedure consist of bruising of the skin and kidney, possible long-term kidney damage, development of HTN, damage to the bowel, spleen, liver, pancreas, male organs or lungs, hematuria, hematuria serious enough to require transfusion or surgical repair, formation of a hematoma, infection or sepsis.  If the stone is too large or dense, it may not break  apart or break apart in large pieces and cause "Steinstrasse."  If this happens, it would result in the need for another procedure (likely URS/LL/stent placement).  IV sedation is typically used, but in rare instances general  anesthesia is used with the risk of irregular heart beat, irregular BP, stroke, MI, CVA, paralysis, coma and/or death.   2. Left hydronephrosis Will obtain RUS to ensure the hydronephrosis has resolved once they have passed and/or recovered from procedure to ensure that no iatrogenic hydronephrosis remains  3. Microscopic hematuria UA today demonstrates > 50 RBC's (from the ED) Continue to monitor the patient's UA after the treatment/passage of the stone to ensure the hematuria has resolved If hematuria persists, we will pursue a hematuria workup with CT Urogram and cystoscopy if appropriate.    Zara Council, PA-C  [facility] [address] [place], [place] [address] [phone]

## 2019-08-02 ENCOUNTER — Encounter
Admission: RE | Disposition: A | Discharge status: Home / Self Care | Payer: Self-pay | Source: Home / Self Care | Attending: Urology

## 2019-08-02 ENCOUNTER — Encounter: Payer: Self-pay | Admitting: Urology

## 2019-08-02 ENCOUNTER — Ambulatory Visit
Admission: RE | Admit: 2019-08-02 | Discharge: 2019-08-02 | Disposition: A | Discharge status: Home / Self Care | Payer: BC Managed Care – PPO | Attending: Urology | Admitting: Urology

## 2019-08-02 DIAGNOSIS — Z880 Allergy status to penicillin: Secondary | ICD-10-CM | POA: Diagnosis not present

## 2019-08-02 DIAGNOSIS — N132 Hydronephrosis with renal and ureteral calculous obstruction: Secondary | ICD-10-CM | POA: Insufficient documentation

## 2019-08-02 DIAGNOSIS — R3129 Other microscopic hematuria: Secondary | ICD-10-CM | POA: Diagnosis not present

## 2019-08-02 DIAGNOSIS — Z87442 Personal history of urinary calculi: Secondary | ICD-10-CM | POA: Insufficient documentation

## 2019-08-02 DIAGNOSIS — Z79899 Other long term (current) drug therapy: Secondary | ICD-10-CM | POA: Insufficient documentation

## 2019-08-02 DIAGNOSIS — N201 Calculus of ureter: Secondary | ICD-10-CM

## 2019-08-02 DIAGNOSIS — Z87891 Personal history of nicotine dependence: Secondary | ICD-10-CM | POA: Diagnosis not present

## 2019-08-02 HISTORY — PX: EXTRACORPOREAL SHOCK WAVE LITHOTRIPSY: SHX1557

## 2019-08-02 SURGERY — LITHOTRIPSY, ESWL
Anesthesia: Moderate Sedation | Laterality: Left

## 2019-08-02 MED ORDER — CIPROFLOXACIN HCL 500 MG PO TABS
ORAL_TABLET | ORAL | Status: AC
  Administered 2019-08-02: 500 mg via ORAL
  Filled 2019-08-02: qty 1

## 2019-08-02 MED ORDER — DIPHENHYDRAMINE HCL 25 MG PO CAPS
ORAL_CAPSULE | ORAL | Status: AC
  Administered 2019-08-02: 25 mg via ORAL
  Filled 2019-08-02: qty 1

## 2019-08-02 MED ORDER — ONDANSETRON HCL 4 MG/2ML IJ SOLN: 4.0000 mg | Freq: Once | INTRAMUSCULAR | Status: AC | PRN

## 2019-08-02 MED ORDER — DIAZEPAM 5 MG PO TABS
ORAL_TABLET | ORAL | Status: AC
  Administered 2019-08-02: 10 mg via ORAL
  Filled 2019-08-02: qty 2

## 2019-08-02 MED ORDER — DIAZEPAM 5 MG PO TABS: 10.0000 mg | ORAL_TABLET | ORAL | Status: AC

## 2019-08-02 MED ORDER — DIPHENHYDRAMINE HCL 25 MG PO CAPS: 25.0000 mg | ORAL_CAPSULE | ORAL | Status: AC

## 2019-08-02 MED ORDER — SODIUM CHLORIDE 0.9 % IV SOLN: INTRAVENOUS | Status: DC

## 2019-08-02 MED ORDER — CIPROFLOXACIN HCL 500 MG PO TABS: 500.0000 mg | ORAL_TABLET | ORAL | Status: AC

## 2019-08-02 MED ORDER — ONDANSETRON HCL 4 MG/2ML IJ SOLN
INTRAMUSCULAR | Status: AC
  Administered 2019-08-02: 4 mg via INTRAVENOUS
  Filled 2019-08-02: qty 2

## 2019-08-02 NOTE — Discharge Instructions (Signed)
AMBULATORY SURGERY  DISCHARGE INSTRUCTIONS   1) The drugs that you were given will stay in your system until tomorrow so for the next 24 hours you should not:  A) Drive an automobile B) Make any legal decisions C) Drink any alcoholic beverage   2) You may resume regular meals tomorrow.  Today it is better to start with liquids and gradually work up to solid foods.  You may eat anything you prefer, but it is better to start with liquids, then soup and crackers, and gradually work up to solid foods.   3) Please notify your doctor immediately if you have any unusual bleeding, trouble breathing, redness and pain at the surgery site, drainage, fever, or pain not relieved by medication.  4) Your post-operative visit with Dr.                                     is: Date:                        Time:    Please call to schedule your post-operative visit.  5) Additional Instructions:  As per the [facility] discharge instructions

## 2019-08-02 NOTE — Interval H&P Note (Signed)
History and Physical Interval Note: CV:RRR Lungs:clear  08/02/2019 9:06 AM  Jonathan Andrews.  has presented today for surgery, with the diagnosis of Kidney stone.  The various methods of treatment have been discussed with the patient and family. After consideration of risks, benefits and other options for treatment, the patient has consented to  Procedure(s): EXTRACORPOREAL SHOCK WAVE LITHOTRIPSY (ESWL) (Left) as a surgical intervention.  The patient's history has been reviewed, patient examined, no change in status, stable for surgery.  I have reviewed the patient's chart and labs.  Questions were answered to the patient's satisfaction.     [place]

## 2019-08-05 ENCOUNTER — Encounter: Payer: Self-pay | Admitting: Urology

## 2019-08-14 ENCOUNTER — Other Ambulatory Visit: Payer: Self-pay

## 2019-08-14 ENCOUNTER — Other Ambulatory Visit: Payer: BC Managed Care – PPO

## 2019-08-14 DIAGNOSIS — N201 Calculus of ureter: Secondary | ICD-10-CM

## 2019-08-22 NOTE — Progress Notes (Signed)
08/23/2019 8:39 AM   Jonathan Kind Jr. Sep 03, 1955 [id number]  Referring provider: Maryland Pink, MD [address] [facility] [place],  [place] [address]  Chief Complaint  Patient presents with  . Nephrolithiasis    HPI: Jonathan Andrews. 64 y.o. male with nephrolithiasis presents today for follow up after ESWL.    CT renal stone study dated 08/01/2019 noted an obstructing 11 mm stone in the mid left ureter associated with moderate left-sided hydronephrosis and marketed inflammatory changes around the ureter.  KUB 11 mm stone just above the transverse process of L4 on the left.    He underwent ESWL with Dr. Bernardo Heater on 08/02/2019.  The post procedural course was as expected and without incidence.    KUB today (08/23/2019) did not identify any calculus in the left ureter.  Stone composition 50% CaOx Monohydrate and 50% CaOx Dihydrate.   His Litholink results from May 17, 2019 note a suboptimal urine volume, hypercalciuria, a moderate calcium oxalate stone risk and a mild calcium phosphate stone risk.   Today, he is feeling well.  Patient denies any modifying or aggravating factors.  Patient denies any gross hematuria, dysuria or suprapubic/flank pain.  Patient denies any fevers, chills, nausea or vomiting.  UA is negative for micro heme  Prior urological history:  Status post cystolitholapaxy of a 3 cm bladder calculus 03/27/2019 -Catheter removed 1 day postop -Fairly significant irritative symptoms for 1 week which have improved significantly -CT also showed a 7 mm nonobstructing lower pole left renal calculus   PMH: Past Medical History:  Diagnosis Date  . Bladder stones   . GERD (gastroesophageal reflux disease)   . Hemorrhoids   . History of kidney stones     Surgical History: Past Surgical History:  Procedure Laterality Date  . CYSTOSCOPY WITH LITHOLAPAXY N/A 03/27/2019   Procedure: CYSTOSCOPY WITH LITHOLAPAXY;  Surgeon: Abbie Sons, MD;  Location:  [facility] ORS;  Service: Urology;  Laterality: N/A;  . EXTRACORPOREAL SHOCK WAVE LITHOTRIPSY Left 08/02/2019   Procedure: EXTRACORPOREAL SHOCK WAVE LITHOTRIPSY (ESWL);  Surgeon: Abbie Sons, MD;  Location: [facility] ORS;  Service: Urology;  Laterality: Left;  . HAND SURGERY    . HAND SURGERY Bilateral   . TENNIS ELBOW RELEASE/NIRSCHEL PROCEDURE Bilateral 04/14/2017   Procedure: TENNIS ELBOW RELEASE/NIRSCHEL PROCEDURE;  Surgeon: Hessie Knows, MD;  Location: [facility] ORS;  Service: Orthopedics;  Laterality: Bilateral;  . TENNIS ELBOW RELEASE/NIRSCHEL PROCEDURE Right 01/03/2018   Procedure: TENNIS ELBOW RELEASE/NIRSCHEL PROCEDURE;  Surgeon: Hessie Knows, MD;  Location: [facility] ORS;  Service: Orthopedics;  Laterality: Right;  . TONSILLECTOMY      Home Medications:  Allergies as of 08/23/2019      Reactions   Penicillins Hives   Has patient had a PCN reaction causing immediate rash, facial/tongue/throat swelling, SOB or lightheadedness with hypotension: No Has patient had a PCN reaction causing severe rash involving mucus membranes or skin necrosis: No Has patient had a PCN reaction that required hospitalization: No Has patient had a PCN reaction occurring within the last 10 years: Unknown If all of the above answers are "NO", then may proceed with Cephalosporin use.   Contrast Media [iodinated Diagnostic Agents] Other (See Comments)   migraine       Medication List       Accurate as of August 23, 2019  8:39 AM. If you have any questions, ask your nurse or doctor.        azelastine 0.1 % nasal spray Commonly known  as: ASTELIN Place 2 sprays into both nostrils daily as needed (vertigo). Use in each nostril as directed   docusate sodium 100 MG capsule Commonly known as: Colace Take 1 tablet once or twice daily as needed for constipation while taking narcotic pain medicine   meclizine 25 MG tablet Commonly known as: ANTIVERT Take 25 mg by mouth 3 (three) times daily as needed (vertigo).     multivitamin with minerals Tabs tablet Take 1 tablet by mouth daily.   ondansetron 4 MG disintegrating tablet Commonly known as: Zofran ODT Allow 1-2 tablets to dissolve in your mouth every 8 hours as needed for nausea/vomiting   oxyCODONE-acetaminophen 5-325 MG tablet Commonly known as: Percocet Take 2 tablets by mouth every 6 (six) hours as needed for severe pain.   SAW PALMETTO PO Take 2 capsules by mouth daily.   tamsulosin 0.4 MG Caps capsule Commonly known as: FLOMAX Take 1 tablet by mouth daily until you pass the kidney stone or no longer have symptoms       Allergies:  Allergies  Allergen Reactions  . Penicillins Hives    Has patient had a PCN reaction causing immediate rash, facial/tongue/throat swelling, SOB or lightheadedness with hypotension: No Has patient had a PCN reaction causing severe rash involving mucus membranes or skin necrosis: No Has patient had a PCN reaction that required hospitalization: No Has patient had a PCN reaction occurring within the last 10 years: Unknown If all of the above answers are "NO", then may proceed with Cephalosporin use.   . Contrast Media [Iodinated Diagnostic Agents] Other (See Comments)    migraine     Family History: Family History  Problem Relation Age of Onset  . Prostate cancer Father   . Bladder Cancer Neg Hx   . Kidney cancer Neg Hx     Social History:  reports that he quit smoking about 33 years ago. His smoking use included cigarettes. He has a 24.00 pack-year smoking history. He has never used smokeless tobacco. He reports current alcohol use. He reports that he does not use drugs.   Physical Exam: BP (!) 145/82   Pulse 74   Ht 6\' 4"  (1.93 m)   Wt 224 lb (101.6 kg)   BMI 27.27 kg/m   Constitutional:  Well nourished. Alert and oriented, No acute distress. HEENT: [place] AT, mask in place.  Trachea midline Cardiovascular: No clubbing, cyanosis, or edema. Respiratory: Normal respiratory effort, no increased  work of breathing. Neurologic: Grossly intact, no focal deficits, moving all 4 extremities. Psychiatric: Normal mood and affect.  Laboratory Data: Urinalysis Component     Latest Ref Rng & Units 08/23/2019  Specific Gravity, UA     1.005 - 1.030 <1.005 (L)  pH, UA     5.0 - 7.5 5.5  Color, UA     Yellow Yellow  Appearance Ur     Clear Clear  Leukocytes,UA     Negative Negative  Protein,UA     Negative/Trace Negative  Glucose, UA     Negative Negative  Ketones, UA     Negative Negative  RBC, UA     Negative Trace (A)  Bilirubin, UA     Negative Negative  Urobilinogen, Ur     0.2 - 1.0 mg/dL 0.2  Nitrite, UA     Negative Negative  Microscopic Examination      See below:   Component     Latest Ref Rng & Units 08/23/2019  WBC, UA     0 -  5 /hpf None seen  RBC     0 - 2 /hpf 0-2  Epithelial Cells (non renal)     0 - 10 /hpf None seen  Bacteria, UA     None seen/Few None seen   Results for orders placed or performed in visit on 08/14/19  Calculi, with Photograph (to Clinical Lab)  Result Value Ref Range   Source Calculi Comment    Color Calculi Brown    Size Calculi 4x3 mm   Weight Calculi 187 mg   Composition Calculi Comment    Calcium Oxalate Monohydrate 50 %   Calcium Oxalate Dihydrate 50 %   Photo Calculi Comment    Comment Calculi 3 Comment    Please Note: Comment    DISCLAIMER: Comment     Pertinent Imaging  CLINICAL DATA:  Left ureteral stone post recent lithotripsy  EXAM: ABDOMEN - 1 VIEW  COMPARISON:  08/01/2019  FINDINGS: The previously identified calculus along the course of the left ureter is no longer seen. No new calculus identified within limitation of overlying bowel gas. Unremarkable bowel gas pattern. No acute osseous abnormality.  IMPRESSION: Previously identified left ureteral calculus is no longer seen.   Electronically Signed   By: Macy Mis M.D.   On: 08/23/2019 15:53 I have independently reviewed the films.   See HPI  Assessment & Plan:    1. Left ureteral stone Status post ESWL Stone analysis notes calcium oxalate stone  2. Left hydronephrosis Will obtain RUS to ensure the hydronephrosis has resolved  I will call patient with results  3. Microscopic hematuria UA today demonstrates no micro heme Continue to monitor the patient's UA after the treatment/passage of the stone to ensure the hematuria has resolved If hematuria persists, we will pursue a hematuria workup with CT Urogram and cystoscopy if appropriate.  4.  Suboptimal urine volume Encouraged patient to increase his fluid intake to 2.5 L  5.  Hypercalciuria We will check a vitamin D level, parathyroid hormone level, and a TSH level today He is advised to limit his diet sodium to 2300 to 3500 mg a day He is to maintain calcium intake to 800 to 1200 mg a day from food He is to limit his protein intake to 0.8 to 1.3 g per kg per day -he readily admits that he enjoys his meats, so this will be difficult for him  6.  Calcium oxalate stones See above He will also start Rennie Natter with his fluids    Zara Council, [place] [address] [place], [place] [address] [phone]  I spent 30  minutes on the day of the encounter to include pre-visit record review, face-to-face time with the patient, and post-visit ordering of tests.-Mainly reviewing his stone analysis results, his Litholink results and encouraged him to have meat intake

## 2019-08-23 ENCOUNTER — Ambulatory Visit
Admission: RE | Admit: 2019-08-23 | Discharge: 2019-08-23 | Disposition: A | Discharge status: Home / Self Care | Payer: BC Managed Care – PPO | Source: Ambulatory Visit | Attending: Urology | Admitting: Urology

## 2019-08-23 ENCOUNTER — Encounter: Payer: Self-pay | Admitting: Urology

## 2019-08-23 ENCOUNTER — Ambulatory Visit (INDEPENDENT_AMBULATORY_CARE_PROVIDER_SITE_OTHER): Payer: BC Managed Care – PPO | Admitting: Urology

## 2019-08-23 ENCOUNTER — Ambulatory Visit
Admission: RE | Admit: 2019-08-23 | Discharge: 2019-08-23 | Disposition: A | Discharge status: Home / Self Care | Payer: BC Managed Care – PPO | Attending: Urology | Admitting: Urology

## 2019-08-23 ENCOUNTER — Other Ambulatory Visit: Payer: Self-pay

## 2019-08-23 VITALS — BP 145/82 | HR 74 | Ht 76.0 in | Wt 224.0 lb

## 2019-08-23 DIAGNOSIS — N200 Calculus of kidney: Secondary | ICD-10-CM

## 2019-08-23 DIAGNOSIS — N201 Calculus of ureter: Secondary | ICD-10-CM

## 2019-08-23 DIAGNOSIS — R34 Anuria and oliguria: Secondary | ICD-10-CM | POA: Diagnosis not present

## 2019-08-23 DIAGNOSIS — R3129 Other microscopic hematuria: Secondary | ICD-10-CM | POA: Diagnosis not present

## 2019-08-23 DIAGNOSIS — R82994 Hypercalciuria: Secondary | ICD-10-CM

## 2019-08-23 DIAGNOSIS — N132 Hydronephrosis with renal and ureteral calculous obstruction: Secondary | ICD-10-CM | POA: Diagnosis not present

## 2019-08-23 LAB — CALCULI, WITH PHOTOGRAPH (CLINICAL LAB)
Calcium Oxalate Dihydrate: 50 %
Calcium Oxalate Monohydrate: 50 %
Weight Calculi: 187 mg

## 2019-08-23 NOTE — Patient Instructions (Addendum)
Citric Acid  Dietary Guidelines to Help Prevent Kidney Stones Kidney stones are deposits of minerals and salts that form inside your kidneys. Your risk of developing kidney stones may be greater depending on your diet, your lifestyle, the medicines you take, and whether you have certain medical conditions. Most people can reduce their chances of developing kidney stones by following the instructions below. Depending on your overall health and the type of kidney stones you tend to develop, your dietitian may give you more specific instructions. What are tips for following this plan? Reading food labels  Choose foods with "no salt added" or "low-salt" labels. Limit your sodium intake to less than 1500 mg per day.  Choose foods with calcium for each meal and snack. Try to eat about 300 mg of calcium at each meal. Foods that contain 200-500 mg of calcium per serving include: ? 8 oz (237 ml) of milk, fortified nondairy milk, and fortified fruit juice. ? 8 oz (237 ml) of kefir, yogurt, and soy yogurt. ? 4 oz (118 ml) of tofu. ? 1 oz of cheese. ? 1 cup (300 g) of dried figs. ? 1 cup (91 g) of cooked broccoli. ? 1-3 oz can of sardines or mackerel.  Most people need 1000 to 1500 mg of calcium each day. Talk to your dietitian about how much calcium is recommended for you. Shopping  Buy plenty of fresh fruits and vegetables. Most people do not need to avoid fruits and vegetables, even if they contain nutrients that may contribute to kidney stones.  When shopping for convenience foods, choose: ? Whole pieces of fruit. ? Premade salads with dressing on the side. ? Low-fat fruit and yogurt smoothies.  Avoid buying frozen meals or prepared deli foods.  Look for foods with live cultures, such as yogurt and kefir. Cooking  Do not add salt to food when cooking. Place a salt shaker on the table and allow each person to add his or her own salt to taste.  Use vegetable protein, such as beans, textured  vegetable protein (TVP), or tofu instead of meat in pasta, casseroles, and soups. Meal planning   Eat less salt, if told by your dietitian. To do this: ? Avoid eating processed or premade food. ? Avoid eating fast food.  Eat less animal protein, including cheese, meat, poultry, or fish, if told by your dietitian. To do this: ? Limit the number of times you have meat, poultry, fish, or cheese each week. Eat a diet free of meat at least 2 days a week. ? Eat only one serving each day of meat, poultry, fish, or seafood. ? When you prepare animal protein, cut pieces into small portion sizes. For most meat and fish, one serving is about the size of one deck of cards.  Eat at least 5 servings of fresh fruits and vegetables each day. To do this: ? Keep fruits and vegetables on hand for snacks. ? Eat 1 piece of fruit or a handful of berries with breakfast. ? Have a salad and fruit at lunch. ? Have two kinds of vegetables at dinner.  Limit foods that are high in a substance called oxalate. These include: ? Spinach. ? Rhubarb. ? Beets. ? Potato chips and french fries. ? Nuts.  If you regularly take a diuretic medicine, make sure to eat at least 1-2 fruits or vegetables high in potassium each day. These include: ? Avocado. ? Banana. ? Orange, prune, carrot, or tomato juice. ? Baked potato. ? Cabbage. ?  Beans and split peas. General instructions   Drink enough fluid to keep your urine clear or pale yellow. This is the most important thing you can do.  Talk to your health care provider and dietitian about taking daily supplements. Depending on your health and the cause of your kidney stones, you may be advised: ? Not to take supplements with vitamin C. ? To take a calcium supplement. ? To take a daily probiotic supplement. ? To take other supplements such as magnesium, fish oil, or vitamin B6.  Take all medicines and supplements as told by your health care provider.  Limit alcohol  intake to no more than 1 drink a day for nonpregnant women and 2 drinks a day for men. One drink equals 12 oz of beer, 5 oz of wine, or 1 oz of hard liquor.  Lose weight if told by your health care provider. Work with your dietitian to find strategies and an eating plan that works best for you. What foods are not recommended? Limit your intake of the following foods, or as told by your dietitian. Talk to your dietitian about specific foods you should avoid based on the type of kidney stones and your overall health. Grains Breads. Bagels. Rolls. Baked goods. Salted crackers. Cereal. Pasta. Vegetables Spinach. Rhubarb. Beets. Canned vegetables. Angie Fava. Olives. Meats and other protein foods Nuts. Nut butters. Large portions of meat, poultry, or fish. Salted or cured meats. Deli meats. Hot dogs. Sausages. Dairy Cheese. Beverages Regular soft drinks. Regular vegetable juice. Seasonings and other foods Seasoning blends with salt. Salad dressings. Canned soups. Soy sauce. Ketchup. Barbecue sauce. Canned pasta sauce. Casseroles. Pizza. Lasagna. Frozen meals. Potato chips. [place] fries. Summary  You can reduce your risk of kidney stones by making changes to your diet.  The most important thing you can do is drink enough fluid. You should drink enough fluid to keep your urine clear or pale yellow.  Ask your health care provider or dietitian how much protein from animal sources you should eat each day, and also how much salt and calcium you should have each day. This information is not intended to replace advice given to you by your health care provider. Make sure you discuss any questions you have with your health care provider. Document Revised: 05/17/2018 Document Reviewed: 01/06/2016 Elsevier Patient Education  2020 [employer].

## 2019-08-24 ENCOUNTER — Telehealth: Payer: Self-pay

## 2019-08-24 LAB — URINALYSIS, COMPLETE
Bilirubin, UA: NEGATIVE
Glucose, UA: NEGATIVE
Ketones, UA: NEGATIVE
Leukocytes,UA: NEGATIVE
Nitrite, UA: NEGATIVE
Protein,UA: NEGATIVE
Specific Gravity, UA: <1.005 — ABNORMAL LOW (ref 1.005–1.030)
Urobilinogen, Ur: 0.2 mg/dL (ref 0.2–1.0)
pH, UA: 5.5 (ref 5.0–7.5)

## 2019-08-24 LAB — MICROSCOPIC EXAMINATION
Bacteria, UA: NONE SEEN
Epithelial Cells (non renal): NONE SEEN /hpf (ref 0–10)
WBC, UA: NONE SEEN /hpf (ref 0–5)

## 2019-08-24 LAB — PTH, INTACT AND CALCIUM
Calcium: 10 mg/dL (ref 8.6–10.2)
PTH: 25 pg/mL (ref 15–65)

## 2019-08-24 LAB — VITAMIN D 25 HYDROXY (VIT D DEFICIENCY, FRACTURES): Vit D, 25-Hydroxy: 36.4 ng/mL (ref 30.0–100.0)

## 2019-08-24 LAB — TSH: TSH: 1.64 u[IU]/mL (ref 0.450–4.500)

## 2019-08-24 NOTE — Telephone Encounter (Signed)
-----   Message from Nori Riis, PA-C sent at 08/24/2019  8:04 AM EDT ----- Please let Mr. Choo know that his thyroid blood work, Vitamin D level and parathyroid hormone level were are normal.

## 2019-08-24 NOTE — Telephone Encounter (Signed)
Patient notified

## 2019-09-19 ENCOUNTER — Other Ambulatory Visit: Payer: Self-pay

## 2019-09-19 ENCOUNTER — Ambulatory Visit
Admission: RE | Admit: 2019-09-19 | Discharge: 2019-09-19 | Disposition: A | Discharge status: Home / Self Care | Payer: BC Managed Care – PPO | Source: Ambulatory Visit | Attending: Urology | Admitting: Urology

## 2019-09-19 DIAGNOSIS — N132 Hydronephrosis with renal and ureteral calculous obstruction: Secondary | ICD-10-CM | POA: Insufficient documentation

## 2019-09-19 DIAGNOSIS — N201 Calculus of ureter: Secondary | ICD-10-CM | POA: Diagnosis not present

## 2019-09-19 DIAGNOSIS — R3129 Other microscopic hematuria: Secondary | ICD-10-CM | POA: Insufficient documentation

## 2019-09-20 ENCOUNTER — Telehealth: Payer: Self-pay | Admitting: Family Medicine

## 2019-09-20 NOTE — Telephone Encounter (Signed)
-----   Message from Nori Riis, PA-C sent at 09/20/2019  8:36 AM EDT ----- Please let [name] know that his kidney ultrasound does not show any swelling in his kidney, but his stone is in his bladder.  It should come out eventually, but we should get an Xray in 6 months to make sure it is gone.

## 2019-09-20 NOTE — Telephone Encounter (Signed)
Patient notified and voiced understanding.

## 2020-03-19 ENCOUNTER — Ambulatory Visit: Payer: Self-pay | Admitting: Surgery

## 2020-03-19 NOTE — H&P (Signed)
History of Present Illness (Pranavi Aure L. Zenia Resides MD; 03/19/2020 1:24 PM) The patient is a 65 year old male who presents with hemorrhoids.HPI: Mr. Beeck is a 65 yo male who was referred for evaluation of hemorrhoids. He has had issues with hemorrhoids for several years and was previously seen by a surgeon several years ago to discuss banding, but at that time he preferred not to have any interventions. He now experiences prolapse of the hemorrhoids every time he has a bowel movement. He usually has a BM every morning, and reports he does not strain to defecate but he does sometimes have firm stools. He reports the hemorrhoids usually reduce on their own after some time, but they often cause discomfort and he is having pain today. He is not on fiber or stool softeners. He reports significant discomfort today. He sometimes has bleeding per rectum during bowel movements. His last colonoscopy was in 2012. He denies a family history of colorectal cancer.  PMH: GERD, kidney stones  PSH: hand surgery, tennis elbow release  FHx: prostate cancer (father)  Social: Former smoker (quit 1988)    Past Surgical History Antonietta Jewel, [place]; 03/19/2020 10:31 AM) Colon Polyp Removal - Colonoscopy  Vasectomy   Diagnostic Studies History ([place], [place]; 03/19/2020 10:31 AM) Colonoscopy  1-5 years ago  Allergies Emmaline Kluver Teressa Senter, CMA; 03/19/2020 10:32 AM) Penicillins  Allergies Reconciled   Medication History (Chanel Teressa Senter, CMA; 03/19/2020 10:32 AM) Losartan Potassium (25MG  Tablet, Oral) Active. Multivitamins (Oral) Active. Medications Reconciled  Social History Antonietta Jewel, CMA; 03/19/2020 10:31 AM) Alcohol use  Occasional alcohol use. Caffeine use  Coffee. No drug use  Tobacco use  Former smoker.  Family History Antonietta Jewel, [place]; 03/19/2020 10:31 AM) Prostate Cancer  Father.  Other Problems Antonietta Jewel, CMA; 03/19/2020 10:31 AM) Gastroesophageal Reflux Disease  Hemorrhoids  Kidney Stone      Review of Systems (Chanel Teressa Senter CMA; 03/19/2020 10:31 AM) General Present- Weight Gain. Not Present- Appetite Loss, Chills, Fatigue, Fever, Night Sweats and Weight Loss. Skin Not Present- Change in Wart/Mole, Dryness, Hives, Jaundice, New Lesions, Non-Healing Wounds, Rash and Ulcer. HEENT Present- Ringing in the Ears, Seasonal Allergies and Wears glasses/contact lenses. Not Present- Earache, Hearing Loss, Hoarseness, Nose Bleed, Oral Ulcers, Sinus Pain, Sore Throat, Visual Disturbances and Yellow Eyes. Respiratory Not Present- Bloody sputum, Chronic Cough, Difficulty Breathing, Snoring and Wheezing. Breast Not Present- Breast Mass, Breast Pain, Nipple Discharge and Skin Changes. Cardiovascular Not Present- Chest Pain, Difficulty Breathing Lying Down, Leg Cramps, Palpitations, Rapid Heart Rate, Shortness of Breath and Swelling of Extremities. Gastrointestinal Present- Hemorrhoids. Not Present- Abdominal Pain, Bloating, Bloody Stool, Change in Bowel Habits, Chronic diarrhea, Constipation, Difficulty Swallowing, Excessive gas, Gets full quickly at meals, Indigestion, Nausea, Rectal Pain and Vomiting. Male Genitourinary Not Present- Blood in Urine, Change in Urinary Stream, Frequency, Impotence, Nocturia, Painful Urination, Urgency and Urine Leakage. Musculoskeletal Not Present- Back Pain, Joint Pain, Joint Stiffness, Muscle Pain, Muscle Weakness and Swelling of Extremities. Neurological Not Present- Decreased Memory, Fainting, Headaches, Numbness, Seizures, Tingling, Tremor, Trouble walking and Weakness. Psychiatric Not Present- Anxiety, Bipolar, Change in Sleep Pattern, Depression, Fearful and Frequent crying. Endocrine Not Present- Cold Intolerance, Excessive Hunger, Hair Changes, Heat Intolerance, Hot flashes and New Diabetes. Hematology Not Present- Blood Thinners, Easy Bruising, Excessive bleeding, Gland problems, HIV and Persistent Infections.  Vitals (Chanel Nolan CMA; 03/19/2020 10:32  AM) 03/19/2020 10:32 AM Weight: 242.38 lb Height: 76in Body Surface Area: 2.4 m Body Mass Index: 29.5 kg/m  Temp.: 96.53F  Pulse: 72 (Regular)  Physical Exam (Ilah Boule L. Zenia Resides MD; 03/19/2020 1:25 PM) The physical exam findings are as follows: Note: Constitutional: No acute distress; conversant; no deformities Neuro: alert and oriented; cranial nerves grossly in tact; no focal deficits Eyes: Moist conjunctiva; anicteric sclerae; extraocular movements in tact Neck: Trachea midline Lungs: Normal respiratory effort; lungs clear to auscultation bilaterally; symmetric chest wall expansion CV: Regular rate and rhythm; no murmurs; no pitting edema GI: Abdomen soft, nontender, nondistended; no masses or organomegaly Anorectal: External hemorrhoids with prolapsed internal hemorrhoids on the left and right, edematous with overlying excoriation and tenderness to palpation. Internal hemorrhoids are not reducible. Digital rectal exam and anoscopy deferred due to pain. MSK: Normal gait and station; no clubbing/cyanosis    Assessment & Plan ([place] L. Zenia Resides MD; 03/19/2020 1:29 PM) PROLAPSED INTERNAL HEMORRHOIDS, GRADE 4 (K64.3) Story: 65 yo male presenting with grade 4 internal hemorrhoids. These are not amenable to band ligation and thus I recommend surgical hemorrhoidectomy. Bleeding per rectum is likely from the hemorrhoids but he is due for a colonoscopy. Would typically prefer a colonoscopy prior to hemorrhoidectomy but given his degree of symptoms from the hemorrhoids I will go ahead and schedule him for surgery. Will also send a referral for colonoscopy. I discussed the details of hemorrhoidectomy and counseled the patient that he can expect severe pain for several weeks after surgery, which may significantly limit his usual activities. He is retired but participates in a lot of activities with his children. He agrees to proceed with surgery. He will be contacted to schedule a date.  In the meantime I recommended stool softeners, fiber and sitz baths, and counseled that he will need to continue on an aggressive bowel regimen after surgery. Will need to do a rectal prep prior to surgery.   Michaelle Birks, [facility], Hepatobiliary and Pancreatic Surgery 03/19/20 1:30 PM

## 2020-03-25 ENCOUNTER — Encounter: Payer: Self-pay | Admitting: Gastroenterology

## 2020-04-04 ENCOUNTER — Other Ambulatory Visit: Payer: Self-pay | Admitting: Urology

## 2020-04-04 DIAGNOSIS — R3129 Other microscopic hematuria: Secondary | ICD-10-CM

## 2020-04-04 DIAGNOSIS — R350 Frequency of micturition: Secondary | ICD-10-CM

## 2020-04-04 DIAGNOSIS — R300 Dysuria: Secondary | ICD-10-CM

## 2020-04-14 ENCOUNTER — Other Ambulatory Visit (HOSPITAL_COMMUNITY)
Admission: RE | Admit: 2020-04-14 | Discharge: 2020-04-14 | Disposition: A | Discharge status: Home / Self Care | Payer: BC Managed Care – PPO | Source: Ambulatory Visit | Attending: Surgery | Admitting: Surgery

## 2020-04-14 DIAGNOSIS — Z01812 Encounter for preprocedural laboratory examination: Secondary | ICD-10-CM | POA: Insufficient documentation

## 2020-04-14 DIAGNOSIS — Z20822 Contact with and (suspected) exposure to covid-19: Secondary | ICD-10-CM | POA: Insufficient documentation

## 2020-04-14 DIAGNOSIS — K642 Third degree hemorrhoids: Secondary | ICD-10-CM | POA: Diagnosis present

## 2020-04-14 DIAGNOSIS — Z880 Allergy status to penicillin: Secondary | ICD-10-CM | POA: Diagnosis not present

## 2020-04-14 DIAGNOSIS — Z87891 Personal history of nicotine dependence: Secondary | ICD-10-CM | POA: Diagnosis not present

## 2020-04-14 DIAGNOSIS — Z91041 Radiographic dye allergy status: Secondary | ICD-10-CM | POA: Diagnosis not present

## 2020-04-14 DIAGNOSIS — Z79899 Other long term (current) drug therapy: Secondary | ICD-10-CM | POA: Diagnosis not present

## 2020-04-14 LAB — SARS CORONAVIRUS 2 (TAT 6-24 HRS): SARS Coronavirus 2: NEGATIVE

## 2020-04-16 ENCOUNTER — Encounter (HOSPITAL_COMMUNITY): Payer: Self-pay | Admitting: Surgery

## 2020-04-16 NOTE — Progress Notes (Signed)
Pt called concerned about not receiving instructions for surgery tomorrow 04/17/20. Pt verbalized bowel prep instructions, and instruction to continue to follow those instructions. Pt told to arrive at 0900 and NPO after midnight. Pt informed that someone will be in contact with him today to give him instructions. SDW call nurse made aware of information given to pt.

## 2020-04-16 NOTE — Progress Notes (Addendum)
Patient denies shortness of breath, fever, cough or chest pain.  PCP - Dr Maryland Pink Cardiologist - n/a Urology - Dr John Giovanni  Chest x-ray - n/a EKG - DOS 04/17/20 Stress Test - n/a ECHO - n/a Cardiac Cath - n/a  STOP now taking any Aspirin (unless otherwise instructed by your surgeon), Aleve, Naproxen, Ibuprofen, Motrin, Advil, Goody's, BC's, all herbal medications, fish oil, and all vitamins.   Coronavirus Screening Covid test on 04/14/20 was negative.  Patient verbalized understanding of instructions that were given via phone.

## 2020-04-17 ENCOUNTER — Ambulatory Visit (HOSPITAL_COMMUNITY): Payer: BC Managed Care – PPO | Admitting: Anesthesiology

## 2020-04-17 ENCOUNTER — Encounter (HOSPITAL_COMMUNITY): Payer: Self-pay | Admitting: Surgery

## 2020-04-17 ENCOUNTER — Ambulatory Visit (HOSPITAL_COMMUNITY)
Admission: RE | Admit: 2020-04-17 | Discharge: 2020-04-17 | Disposition: A | Discharge status: Home / Self Care | Payer: BC Managed Care – PPO | Attending: Surgery | Admitting: Surgery

## 2020-04-17 ENCOUNTER — Encounter (HOSPITAL_COMMUNITY)
Admission: RE | Disposition: A | Discharge status: Home / Self Care | Payer: Self-pay | Source: Home / Self Care | Attending: Surgery

## 2020-04-17 ENCOUNTER — Other Ambulatory Visit: Payer: Self-pay

## 2020-04-17 DIAGNOSIS — Z87891 Personal history of nicotine dependence: Secondary | ICD-10-CM | POA: Diagnosis not present

## 2020-04-17 DIAGNOSIS — Z20822 Contact with and (suspected) exposure to covid-19: Secondary | ICD-10-CM | POA: Insufficient documentation

## 2020-04-17 DIAGNOSIS — K642 Third degree hemorrhoids: Secondary | ICD-10-CM | POA: Diagnosis not present

## 2020-04-17 DIAGNOSIS — Z91041 Radiographic dye allergy status: Secondary | ICD-10-CM | POA: Insufficient documentation

## 2020-04-17 DIAGNOSIS — Z880 Allergy status to penicillin: Secondary | ICD-10-CM | POA: Diagnosis not present

## 2020-04-17 DIAGNOSIS — Z79899 Other long term (current) drug therapy: Secondary | ICD-10-CM | POA: Insufficient documentation

## 2020-04-17 HISTORY — PX: RECTAL EXAM UNDER ANESTHESIA: SHX6399

## 2020-04-17 HISTORY — PX: EVALUATION UNDER ANESTHESIA WITH HEMORRHOIDECTOMY: SHX5624

## 2020-04-17 HISTORY — DX: Essential (primary) hypertension: I10

## 2020-04-17 LAB — BASIC METABOLIC PANEL
Anion gap: 8 (ref 5–15)
BUN: 14 mg/dL (ref 8–23)
CO2: 21 mmol/L — ABNORMAL LOW (ref 22–32)
Calcium: 9.1 mg/dL (ref 8.9–10.3)
Chloride: 107 mmol/L (ref 98–111)
Creatinine, Ser: 1.02 mg/dL (ref 0.61–1.24)
GFR, Estimated: >60 mL/min (ref >60)
Glucose, Bld: 89 mg/dL (ref 70–99)
Potassium: 3.9 mmol/L (ref 3.5–5.1)
Sodium: 136 mmol/L (ref 135–145)

## 2020-04-17 LAB — CBC
HCT: 44.8 % (ref 39.0–52.0)
Hemoglobin: 15.4 g/dL (ref 13.0–17.0)
MCH: 34.1 pg — ABNORMAL HIGH (ref 26.0–34.0)
MCHC: 34.4 g/dL (ref 30.0–36.0)
MCV: 99.1 fL (ref 80.0–100.0)
Platelets: 193 10*3/uL (ref 150–400)
RBC: 4.52 MIL/uL (ref 4.22–5.81)
RDW: 11.8 % (ref 11.5–15.5)
WBC: 4.1 10*3/uL (ref 4.0–10.5)
nRBC: 0 % (ref 0.0–0.2)

## 2020-04-17 SURGERY — EXAM UNDER ANESTHESIA WITH HEMORRHOIDECTOMY
Anesthesia: General | Site: Renal

## 2020-04-17 MED ORDER — OXYCODONE HCL 5 MG PO TABS: 5.0000 mg | ORAL_TABLET | ORAL | 0 refills | Status: AC | PRN

## 2020-04-17 MED ORDER — GABAPENTIN 300 MG PO CAPS
300.0000 mg | ORAL_CAPSULE | ORAL | Status: AC
  Administered 2020-04-17: 300 mg via ORAL
  Filled 2020-04-17: qty 1

## 2020-04-17 MED ORDER — FENTANYL CITRATE (PF) 250 MCG/5ML IJ SOLN
INTRAMUSCULAR | Status: AC
  Filled 2020-04-17: qty 5

## 2020-04-17 MED ORDER — LIDOCAINE 2% (20 MG/ML) 5 ML SYRINGE
INTRAMUSCULAR | Status: AC
  Filled 2020-04-17: qty 5

## 2020-04-17 MED ORDER — ACETAMINOPHEN 325 MG PO TABS: 650.0000 mg | ORAL_TABLET | Freq: Four times a day (QID) | ORAL | 2 refills | Status: AC

## 2020-04-17 MED ORDER — MIDAZOLAM HCL 5 MG/5ML IJ SOLN
INTRAMUSCULAR | Status: DC | PRN
  Administered 2020-04-17: 2 mg via INTRAVENOUS

## 2020-04-17 MED ORDER — HYDROMORPHONE HCL 1 MG/ML IJ SOLN
INTRAMUSCULAR | Status: AC
  Filled 2020-04-17: qty 1

## 2020-04-17 MED ORDER — MIDAZOLAM HCL 2 MG/2ML IJ SOLN
INTRAMUSCULAR | Status: AC
  Filled 2020-04-17: qty 2

## 2020-04-17 MED ORDER — CHLORHEXIDINE GLUCONATE 0.12 % MT SOLN
OROMUCOSAL | Status: AC
  Administered 2020-04-17: 15 mL via OROMUCOSAL
  Filled 2020-04-17: qty 15

## 2020-04-17 MED ORDER — ACETAMINOPHEN 500 MG PO TABS
1000.0000 mg | ORAL_TABLET | ORAL | Status: AC
  Administered 2020-04-17: 1000 mg via ORAL
  Filled 2020-04-17: qty 2

## 2020-04-17 MED ORDER — ONDANSETRON HCL 4 MG/2ML IJ SOLN
INTRAMUSCULAR | Status: DC | PRN
  Administered 2020-04-17: 4 mg via INTRAVENOUS

## 2020-04-17 MED ORDER — OXYCODONE HCL 5 MG/5ML PO SOLN: 5.0000 mg | Freq: Once | ORAL | Status: DC | PRN

## 2020-04-17 MED ORDER — ORAL CARE MOUTH RINSE: 15.0000 mL | Freq: Once | OROMUCOSAL | Status: AC

## 2020-04-17 MED ORDER — PROPOFOL 10 MG/ML IV BOLUS
INTRAVENOUS | Status: DC | PRN
  Administered 2020-04-17: 200 mg via INTRAVENOUS
  Administered 2020-04-17: 100 mg via INTRAVENOUS

## 2020-04-17 MED ORDER — OXYCODONE HCL 5 MG PO TABS: 5.0000 mg | ORAL_TABLET | Freq: Once | ORAL | Status: DC | PRN

## 2020-04-17 MED ORDER — HYDROMORPHONE HCL 1 MG/ML IJ SOLN
0.2500 mg | INTRAMUSCULAR | Status: DC | PRN
  Administered 2020-04-17 (×2): 0.5 mg via INTRAVENOUS
  Administered 2020-04-17 (×2): 0.25 mg via INTRAVENOUS

## 2020-04-17 MED ORDER — SUGAMMADEX SODIUM 200 MG/2ML IV SOLN
INTRAVENOUS | Status: DC | PRN
  Administered 2020-04-17: 210 mg via INTRAVENOUS
  Administered 2020-04-17: 190 mg via INTRAVENOUS

## 2020-04-17 MED ORDER — MIDAZOLAM HCL 2 MG/2ML IJ SOLN
0.5000 mg | Freq: Once | INTRAMUSCULAR | Status: DC | PRN
Start: 2020-04-17 — End: 2020-04-19

## 2020-04-17 MED ORDER — THROMBIN (RECOMBINANT) 20000 UNITS EX SOLR
CUTANEOUS | Status: AC
  Filled 2020-04-17: qty 20000

## 2020-04-17 MED ORDER — LACTATED RINGERS IV SOLN: INTRAVENOUS | Status: DC

## 2020-04-17 MED ORDER — DOCUSATE SODIUM 100 MG PO CAPS: 100.0000 mg | ORAL_CAPSULE | Freq: Two times a day (BID) | ORAL | 2 refills | Status: AC

## 2020-04-17 MED ORDER — MEPERIDINE HCL 25 MG/ML IJ SOLN: 6.2500 mg | INTRAMUSCULAR | Status: DC | PRN

## 2020-04-17 MED ORDER — FLEET ENEMA 7-19 GM/118ML RE ENEM
1.0000 | ENEMA | Freq: Once | RECTAL | Status: DC
  Filled 2020-04-17: qty 1

## 2020-04-17 MED ORDER — ROCURONIUM BROMIDE 10 MG/ML (PF) SYRINGE
PREFILLED_SYRINGE | INTRAVENOUS | Status: DC | PRN
  Administered 2020-04-17: 60 mg via INTRAVENOUS

## 2020-04-17 MED ORDER — HEMOSTATIC AGENTS (NO CHARGE) OPTIME
TOPICAL | Status: DC | PRN
  Administered 2020-04-17: 1 via TOPICAL

## 2020-04-17 MED ORDER — CHLORHEXIDINE GLUCONATE 0.12 % MT SOLN: 15.0000 mL | Freq: Once | OROMUCOSAL | Status: AC

## 2020-04-17 MED ORDER — BUPIVACAINE LIPOSOME 1.3 % IJ SUSP
20.0000 mL | Freq: Once | INTRAMUSCULAR | Status: DC
  Filled 2020-04-17: qty 20

## 2020-04-17 MED ORDER — PROMETHAZINE HCL 25 MG/ML IJ SOLN: 6.2500 mg | INTRAMUSCULAR | Status: DC | PRN

## 2020-04-17 MED ORDER — LIDOCAINE 2% (20 MG/ML) 5 ML SYRINGE
INTRAMUSCULAR | Status: DC | PRN
  Administered 2020-04-17: 60 mg via INTRAVENOUS

## 2020-04-17 MED ORDER — FIBERCON 625 MG PO TABS: 625.0000 mg | ORAL_TABLET | Freq: Every day | ORAL | 2 refills | Status: DC

## 2020-04-17 MED ORDER — ROCURONIUM BROMIDE 10 MG/ML (PF) SYRINGE
PREFILLED_SYRINGE | INTRAVENOUS | Status: AC
  Filled 2020-04-17: qty 10

## 2020-04-17 MED ORDER — DEXAMETHASONE SODIUM PHOSPHATE 10 MG/ML IJ SOLN
INTRAMUSCULAR | Status: AC
  Filled 2020-04-17: qty 1

## 2020-04-17 MED ORDER — FENTANYL CITRATE (PF) 100 MCG/2ML IJ SOLN
INTRAMUSCULAR | Status: DC | PRN
  Administered 2020-04-17: 25 ug via INTRAVENOUS
  Administered 2020-04-17: 100 ug via INTRAVENOUS

## 2020-04-17 MED ORDER — PROPOFOL 10 MG/ML IV BOLUS
INTRAVENOUS | Status: AC
  Filled 2020-04-17: qty 20

## 2020-04-17 MED ORDER — SODIUM CHLORIDE (PF) 0.9 % IJ SOLN
INTRAMUSCULAR | Status: DC | PRN
  Administered 2020-04-17: 20 mL via INTRAVENOUS

## 2020-04-17 MED ORDER — BUPIVACAINE LIPOSOME 1.3 % IJ SUSP
INTRAMUSCULAR | Status: DC | PRN
  Administered 2020-04-17: 20 mL

## 2020-04-17 MED ORDER — DEXAMETHASONE SODIUM PHOSPHATE 10 MG/ML IJ SOLN
INTRAMUSCULAR | Status: DC | PRN
  Administered 2020-04-17: 10 mg via INTRAVENOUS

## 2020-04-17 MED ORDER — 0.9 % SODIUM CHLORIDE (POUR BTL) OPTIME
TOPICAL | Status: DC | PRN
  Administered 2020-04-17: 1000 mL

## 2020-04-17 MED ORDER — POLYETHYLENE GLYCOL 3350 17 G PO PACK
17.0000 g | PACK | Freq: Every day | ORAL | 0 refills | Status: DC | PRN
Start: 2020-04-17 — End: 2023-05-02

## 2020-04-17 MED ORDER — ONDANSETRON HCL 4 MG/2ML IJ SOLN
INTRAMUSCULAR | Status: AC
  Filled 2020-04-17: qty 2

## 2020-04-17 SURGICAL SUPPLY — 39 items
CANISTER SUCT 3000ML PPV (MISCELLANEOUS) ×3 IMPLANT
COVER MAYO STAND STRL (DRAPES) IMPLANT
COVER SURGICAL LIGHT HANDLE (MISCELLANEOUS) ×3 IMPLANT
COVER WAND RF STERILE (DRAPES) IMPLANT
DRAPE LAPAROTOMY 100X72 PEDS (DRAPES) ×3 IMPLANT
DRAPE UTILITY XL STRL (DRAPES) IMPLANT
DRSG PAD ABDOMINAL 8X10 ST (GAUZE/BANDAGES/DRESSINGS) ×3 IMPLANT
ELECT REM PT RETURN 9FT ADLT (ELECTROSURGICAL) ×3
ELECTRODE REM PT RTRN 9FT ADLT (ELECTROSURGICAL) ×2 IMPLANT
GAUZE 4X4 16PLY RFD (DISPOSABLE) ×3 IMPLANT
GAUZE SPONGE 4X4 12PLY STRL (GAUZE/BANDAGES/DRESSINGS) IMPLANT
GLOVE BIO SURGEON STRL SZ8 (GLOVE) IMPLANT
GLOVE SRG 8 PF TXTR STRL LF DI (GLOVE) ×2 IMPLANT
GLOVE SURG UNDER POLY LF SZ8 (GLOVE) ×3
GOWN STRL REUS W/ TWL LRG LVL3 (GOWN DISPOSABLE) ×2 IMPLANT
GOWN STRL REUS W/ TWL XL LVL3 (GOWN DISPOSABLE) IMPLANT
GOWN STRL REUS W/TWL LRG LVL3 (GOWN DISPOSABLE) ×3
GOWN STRL REUS W/TWL XL LVL3 (GOWN DISPOSABLE)
KIT BASIN OR (CUSTOM PROCEDURE TRAY) ×3 IMPLANT
KIT SIGMOIDOSCOPE (SET/KITS/TRAYS/PACK) IMPLANT
KIT TURNOVER KIT B (KITS) ×3 IMPLANT
NEEDLE 22X1 1/2 (OR ONLY) (NEEDLE) ×3 IMPLANT
NS IRRIG 1000ML POUR BTL (IV SOLUTION) ×3 IMPLANT
PACK GENERAL/GYN (CUSTOM PROCEDURE TRAY) ×3 IMPLANT
PACK LITHOTOMY IV (CUSTOM PROCEDURE TRAY) IMPLANT
PAD ARMBOARD 7.5X6 YLW CONV (MISCELLANEOUS) ×9 IMPLANT
PENCIL SMOKE EVACUATOR (MISCELLANEOUS) ×3 IMPLANT
SHEARS HARMONIC 9CM CVD (BLADE) IMPLANT
SPECIMEN JAR SMALL (MISCELLANEOUS) ×3 IMPLANT
SPONGE HEMORRHOID 8X3CM (HEMOSTASIS) ×3 IMPLANT
SPONGE SURGIFOAM ABS GEL 100 (HEMOSTASIS) IMPLANT
SURGILUBE 2OZ TUBE FLIPTOP (MISCELLANEOUS) ×3 IMPLANT
SUT CHROMIC 2 0 SH (SUTURE) ×12 IMPLANT
SUT CHROMIC 3 0 SH 27 (SUTURE) IMPLANT
SYR CONTROL 10ML LL (SYRINGE) ×3 IMPLANT
TOWEL GREEN STERILE FF (TOWEL DISPOSABLE) ×6 IMPLANT
TUBE CONNECTING 12X1/4 (SUCTIONS) IMPLANT
UNDERPAD 30X36 HEAVY ABSORB (UNDERPADS AND DIAPERS) IMPLANT
YANKAUER SUCT BULB TIP NO VENT (SUCTIONS) IMPLANT

## 2020-04-17 NOTE — H&P (Signed)
Ryler E Chaise Mahabir. is an 65 y.o. male.   Chief Complaint: hemorrhoids  HPI: Mr. Renstrom is a 65 yo male who prevented with hemorrhoids with severe symptoms. He has frequent prolapse with difficulty reducing. Has occasional bleeding. He is on stool softeners and has regular bowel movements. Exam in office was limited secondary to pain but he was noted to have prolapsed internal hemorrhoids with inflammation. He presents today for hemorrhoidectomy.  Past Medical History:  Diagnosis Date  . Bladder stones   . GERD (gastroesophageal reflux disease)   . Hemorrhoids   . History of kidney stones   . Hypertension     Past Surgical History:  Procedure Laterality Date  . CYSTOSCOPY WITH LITHOLAPAXY N/A 03/27/2019   Procedure: CYSTOSCOPY WITH LITHOLAPAXY;  Surgeon: Abbie Sons, MD;  Location: [facility];  Service: Urology;  Laterality: N/A;  . EXTRACORPOREAL SHOCK WAVE LITHOTRIPSY Left 08/02/2019   Procedure: EXTRACORPOREAL SHOCK WAVE LITHOTRIPSY (ESWL);  Surgeon: Abbie Sons, MD;  Location: [facility];  Service: Urology;  Laterality: Left;  . HAND SURGERY    . HAND SURGERY Bilateral   . TENNIS ELBOW RELEASE/NIRSCHEL PROCEDURE Bilateral 04/14/2017   Procedure: TENNIS ELBOW RELEASE/NIRSCHEL PROCEDURE;  Surgeon: Hessie Knows, MD;  Location: [facility];  Service: Orthopedics;  Laterality: Bilateral;  . TENNIS ELBOW RELEASE/NIRSCHEL PROCEDURE Right 01/03/2018   Procedure: TENNIS ELBOW RELEASE/NIRSCHEL PROCEDURE;  Surgeon: Hessie Knows, MD;  Location: [facility];  Service: Orthopedics;  Laterality: Right;  . TONSILLECTOMY      Family History  Problem Relation Age of Onset  . Prostate cancer Father   . Bladder Cancer Neg Hx   . Kidney cancer Neg Hx    Social History:  reports that he quit smoking about 34 years ago. His smoking use included cigarettes. He has a 24.00 pack-year smoking history. He has never used smokeless tobacco. He reports current alcohol use. He reports that he does not use  drugs.  Allergies:  Allergies  Allergen Reactions  . Penicillins Hives    Has patient had a PCN reaction causing immediate rash, facial/tongue/throat swelling, SOB or lightheadedness with hypotension: No Has patient had a PCN reaction causing severe rash involving mucus membranes or skin necrosis: No Has patient had a PCN reaction that required hospitalization: No Has patient had a PCN reaction occurring within the last 10 years: Unknown If all of the above answers are "NO", then may proceed with Cephalosporin use.   . Contrast Media [Iodinated Diagnostic Agents] Other (See Comments)    migraine     Medications Prior to Admission  Medication Sig Dispense Refill  . losartan (COZAAR) 25 MG tablet Take 25 mg by mouth daily.    . Multiple Vitamin (MULTIVITAMIN WITH MINERALS) TABS tablet Take 1 tablet by mouth daily.    . phenylephrine-shark liver oil-mineral oil-petrolatum (PREPARATION H) 0.25-14-74.9 % rectal ointment Place 1 application rectally in the morning and at bedtime.    . Saw Palmetto, Serenoa repens, (SAW PALMETTO PO) Take 2 capsules by mouth daily.    Marland Kitchen azelastine (ASTELIN) 0.1 % nasal spray Place 2 sprays into both nostrils daily as needed (vertigo). Use in each nostril as directed (Patient not taking: Reported on 04/08/2020)    . docusate sodium (COLACE) 100 MG capsule Take 1 tablet once or twice daily as needed for constipation while taking narcotic pain medicine (Patient not taking: No sig reported) 30 capsule 0  . meclizine (ANTIVERT) 25 MG tablet Take 25 mg by mouth 3 (three) times daily as  needed (vertigo). (Patient not taking: Reported on 04/08/2020)    . ondansetron (ZOFRAN ODT) 4 MG disintegrating tablet Allow 1-2 tablets to dissolve in your mouth every 8 hours as needed for nausea/vomiting (Patient not taking: No sig reported) 30 tablet 0  . oxyCODONE-acetaminophen (PERCOCET) 5-325 MG tablet Take 2 tablets by mouth every 6 (six) hours as needed for severe pain. (Patient not  taking: No sig reported) 30 tablet 0  . tamsulosin (FLOMAX) 0.4 MG CAPS capsule Take 1 tablet by mouth daily until you pass the kidney stone or no longer have symptoms (Patient not taking: Reported on 04/08/2020) 14 capsule 0    Results for orders placed or performed during the hospital encounter of 04/17/20 (from the past 48 hour(s))  Basic metabolic panel per protocol     Status: Abnormal   Collection Time: 04/17/20  9:45 AM  Result Value Ref Range   Sodium 136 135 - 145 mmol/L   Potassium 3.9 3.5 - 5.1 mmol/L   Chloride 107 98 - 111 mmol/L   CO2 21 (L) 22 - 32 mmol/L   Glucose, Bld 89 70 - 99 mg/dL    Comment: Glucose reference range applies only to samples taken after fasting for at least 8 hours.   BUN 14 8 - 23 mg/dL   Creatinine, Ser 1.02 0.61 - 1.24 mg/dL   Calcium 9.1 8.9 - 10.3 mg/dL   GFR, Estimated >60 >60 mL/min    Comment: (NOTE) Calculated using the CKD-EPI Creatinine Equation (2021)    Anion gap 8 5 - 15    Comment: Performed at [facility] [address], [place], [place] [address]  CBC per protocol     Status: Abnormal   Collection Time: 04/17/20  9:45 AM  Result Value Ref Range   WBC 4.1 4.0 - 10.5 K/uL   RBC 4.52 4.22 - 5.81 MIL/uL   Hemoglobin 15.4 13.0 - 17.0 g/dL   HCT 44.8 39.0 - 52.0 %   MCV 99.1 80.0 - 100.0 fL   MCH 34.1 (H) 26.0 - 34.0 pg   MCHC 34.4 30.0 - 36.0 g/dL   RDW 11.8 11.5 - 15.5 %   Platelets 193 150 - 400 K/uL   nRBC 0.0 0.0 - 0.2 %    Comment: Performed at [facility], [place] [address], [place], [place] [address]   No results found.  Review of Systems  Constitutional: Negative for chills and fatigue.  Gastrointestinal: Positive for anal bleeding. Negative for constipation and nausea.  Allergic/Immunologic: Negative for immunocompromised state.  Neurological: Negative for dizziness and speech difficulty.  Psychiatric/Behavioral: Negative for agitation and confusion.    Blood pressure 139/88, pulse (!) 58, temperature  98.2 F (36.8 C), temperature source Oral, resp. rate 18, height 6\' 4"  (1.93 m), weight 106.1 kg, SpO2 99 %. Physical Exam Constitutional:      Appearance: Normal appearance.  HENT:     Head: Normocephalic and atraumatic.  Eyes:     General: No scleral icterus. Pulmonary:     Effort: Pulmonary effort is normal. No respiratory distress.  Musculoskeletal:        General: Normal range of motion.     Cervical back: Normal range of motion.  Skin:    General: Skin is warm and dry.  Neurological:     General: No focal deficit present.     Mental Status: He is alert and oriented to person, place, and time.  Psychiatric:  Mood and Affect: Mood normal.        Behavior: Behavior normal.        Thought Content: Thought content normal.      Assessment/Plan 65 yo male with symptomatic grade 3 hemorrhoids. Proceed to OR for EUA and hemorrhoidectomy. Plan for discharge home from PACU. Patient counseled on postoperative bowel regimen, sitz baths, and pain control. He is due for a colonoscopy and has been referred, reports he has an appointment with [facility] at the end of this month.  Dwan Bolt, MD 04/17/2020, 11:05 AM

## 2020-04-17 NOTE — Anesthesia Procedure Notes (Signed)
Procedure Name: Intubation Date/Time: 04/17/2020 11:30 AM Performed by: Amadeo Garnet, CRNA Pre-anesthesia Checklist: Patient identified, Emergency Drugs available, Suction available and Patient being monitored Patient Re-evaluated:Patient Re-evaluated prior to induction Oxygen Delivery Method: Circle system utilized Preoxygenation: Pre-oxygenation with 100% oxygen Induction Type: IV induction Ventilation: Mask ventilation without difficulty Laryngoscope Size: Mac and 4 Grade View: Grade III Tube type: Oral Tube size: 7.5 mm Number of attempts: 2 Airway Equipment and Method: Stylet Placement Confirmation: ETT inserted through vocal cords under direct vision,  positive ETCO2,  breath sounds checked- equal and bilateral and CO2 detector Secured at: 23 cm Tube secured with: Tape Dental Injury: Teeth and Oropharynx as per pre-operative assessment

## 2020-04-17 NOTE — Transfer of Care (Signed)
Immediate Anesthesia Transfer of Care Note  Patient: Jonathan Andrews.  Procedure(s) Performed: HEMORRHOIDECTOMY (N/A Rectum) RECTAL EXAM UNDER ANESTHESIA (Renal)  Patient Location: PACU  Anesthesia Type:General  Level of Consciousness: drowsy  Airway & Oxygen Therapy: Patient Spontanous Breathing and Patient connected to face mask oxygen  Post-op Assessment: Report given to RN, Post -op Vital signs reviewed and stable and Patient moving all extremities  Post vital signs: Reviewed and stable  Last Vitals:  Vitals Value Taken Time  BP 134/84 04/17/20 1239  Temp    Pulse 62 04/17/20 1240  Resp 18 04/17/20 1240  SpO2 100 % 04/17/20 1240  Vitals shown include unvalidated device data.  Last Pain:  Vitals:   04/17/20 0934  TempSrc:   PainSc: 4          Complications: No complications documented.

## 2020-04-17 NOTE — Discharge Instructions (Signed)
[facility] DISCHARGE INSTRUCTIONS  Activity . You may resume activities as tolerated. . Do not drive while taking narcotic pain medication.  Wound Care . You may feel some sutures on the skin around the anus - these will dissolve and do not need to be removed. . You may have some drainage and small amounts of bleeding after surgery. If you have persistent excessive bleeding, please call the office immediately or come to the emergency room. . There is packing in your rectum that will fall out when you have your first bowel movement after surgery - this should not be replaced. . You should perform sitz baths 3 times daily and after bowel movements - sit in several inches of warm water for 15-20 minutes to keep the area clean.  Medications  Take the stool softeners and fiber as prescribed. It is very important to avoid constipation and straining with bowel movements.  Drink plenty of water every day.  Take the pain medication as prescribed. It is normal to have significant pain after hemorrhoidectomy.  When to Call us: Marland Kitchen Fever greater than 100.5 . Persistent bleeding per rectum . Difficulty urinating  Follow-up You have an appointment scheduled with Dr. Zenia Resides on May 14, 2020 at 9:30am. This will be at the [facility] office at [address] [place], [place], [place]. Please arrive at least 15 minutes prior to your scheduled appointment time.  For questions or concerns, please call the office at [phone].

## 2020-04-17 NOTE — Op Note (Signed)
Date: 04/17/20  Patient: Jonathan Andrews. MRN: [id number]  Preoperative Diagnosis: Grade 3 internal hemorrhoids Postoperative Diagnosis: Same  Procedure: Hemorrhoidectomy  Surgeon: Michaelle Birks, MD  EBL: 30 mL  Anesthesia: General  Specimens: Internal hemorrhoids  Indications: Mr. Knutzen is a 65 yo male who presented with prolapse of internal hemorrhoids with significant symptoms. After a discussion of the risks and benefits of surgery, he agreed to proceed with surgical hemorrhoidectomy.  Findings: Enlargement and inflammation of all three internal hemorrhoid complexes. Three-column hemorrhoidectomy performed.  Procedure details: Informed consent was obtained in the preoperative area prior to the procedure. The patient was brought to the operating room and general anesthesia was induced. The patient was placed in the prone position, and prepped and draped in the usual sterile fashion. A pre-procedure timeout was taken verifying patient identity, surgical site and procedure to be performed.  A digital rectal exam showed enlarged hemorrhoids but no masses. A Hill-Ferguson retractor was placed and the left lateral, right anterior and right posterior hemorrhoid complexes were all enlarged and inflamed. The right posterior internal hemorrhoid was grasped with an Allis clamp and excised using cautery, starting at the anal verge and working into the anal canal, taking care to preserve the underlying sphincter muscle. The hemorrhoid was completely excised and sent for routine pathology. The mucosal defect was closed with a running 2-0 chromic suture. The right anterior and left lateral internal hemorrhoids were excised in similar fashion. The perianal skin was infiltrated with 266mg  Exparel and packing was placed in the rectum.  The patient tolerated the procedure with no apparent complications. All counts were correct x2 at the end of the procedure. The patient was extubated and taken to PACU  in stable condition.  Michaelle Birks, MD 04/17/20 1:04 PM

## 2020-04-17 NOTE — Anesthesia Postprocedure Evaluation (Signed)
Anesthesia Post Note  Patient: Jonathan Andrews.  Procedure(s) Performed: HEMORRHOIDECTOMY (N/A Rectum) RECTAL EXAM UNDER ANESTHESIA (Renal)     Patient location during evaluation: PACU Anesthesia Type: General Level of consciousness: awake and alert, patient cooperative and oriented Pain management: pain level controlled Vital Signs Assessment: post-procedure vital signs reviewed and stable Respiratory status: spontaneous breathing, nonlabored ventilation and respiratory function stable Cardiovascular status: blood pressure returned to baseline and stable Postop Assessment: no apparent nausea or vomiting and able to ambulate Anesthetic complications: no   No complications documented.  Last Vitals:  Vitals:   04/17/20 1325 04/17/20 1340  BP: (!) 149/99 134/86  Pulse: 63 (!) 57  Resp: 16 12  Temp:  36.6 C  SpO2: 100% 99%    Last Pain:  Vitals:   04/17/20 1340  TempSrc:   PainSc: 2                  Lizett Chowning,E. Mira Balon

## 2020-04-17 NOTE — Anesthesia Preprocedure Evaluation (Addendum)
Anesthesia Evaluation  Patient identified by MRN, date of birth, ID band Patient awake    Reviewed: Allergy & Precautions, NPO status , Patient's Chart, lab work & pertinent test results  History of Anesthesia Complications Negative for: history of anesthetic complications  Airway Mallampati: II  TM Distance: >3 FB Neck ROM: Full    Dental  (+) Dental Advisory Given   Pulmonary former smoker,  04/14/2020 SARS coronavirus NEG   breath sounds clear to auscultation       Cardiovascular hypertension, Pt. on medications (-) angina Rhythm:Regular Rate:Normal     Neuro/Psych negative neurological ROS     GI/Hepatic Neg liver ROS, GERD  Controlled,  Endo/Other  negative endocrine ROS  Renal/GU negative Renal ROS     Musculoskeletal   Abdominal   Peds  Hematology negative hematology ROS (+)   Anesthesia Other Findings   Reproductive/Obstetrics                            Anesthesia Physical Anesthesia Plan  ASA: II  Anesthesia Plan: General   Post-op Pain Management:    Induction: Intravenous  PONV Risk Score and Plan: 2 and Ondansetron and Dexamethasone  Airway Management Planned: Oral ETT  Additional Equipment: None  Intra-op Plan:   Post-operative Plan: Extubation in OR  Informed Consent: I have reviewed the patients History and Physical, chart, labs and discussed the procedure including the risks, benefits and alternatives for the proposed anesthesia with the patient or authorized representative who has indicated his/her understanding and acceptance.     Dental advisory given  Plan Discussed with: CRNA and Surgeon  Anesthesia Plan Comments:        Anesthesia Quick Evaluation

## 2020-04-18 ENCOUNTER — Encounter (HOSPITAL_COMMUNITY): Payer: Self-pay | Admitting: Surgery

## 2020-04-18 LAB — SURGICAL PATHOLOGY

## 2020-05-06 ENCOUNTER — Encounter: Payer: Self-pay | Admitting: Gastroenterology

## 2020-05-06 ENCOUNTER — Other Ambulatory Visit: Payer: Self-pay

## 2020-05-06 ENCOUNTER — Ambulatory Visit (INDEPENDENT_AMBULATORY_CARE_PROVIDER_SITE_OTHER): Payer: BC Managed Care – PPO | Admitting: Gastroenterology

## 2020-05-06 VITALS — BP 118/80 | HR 75 | Ht 76.0 in | Wt 237.0 lb

## 2020-05-06 DIAGNOSIS — K649 Unspecified hemorrhoids: Secondary | ICD-10-CM | POA: Diagnosis not present

## 2020-05-06 DIAGNOSIS — Z1211 Encounter for screening for malignant neoplasm of colon: Secondary | ICD-10-CM

## 2020-05-06 NOTE — Patient Instructions (Signed)
If you are age 63 or older, your body mass index should be between 23-30. Your Body mass index is 28.85 kg/m. If this is out of the aforementioned range listed, please consider follow up with your Primary Care Provider.  If you are age 44 or younger, your body mass index should be between 19-25. Your Body mass index is 28.85 kg/m. If this is out of the aformentioned range listed, please consider follow up with your Primary Care Provider.   You have been scheduled for a colonoscopy. Please follow written instructions given to you at your visit today.  Please pick up your prep supplies at the pharmacy within the next 1-3 days. If you use inhalers (even only as needed), please bring them with you on the day of your procedure.  Due to recent changes in healthcare laws, you may see the results of your imaging and laboratory studies on MyChart before your provider has had a chance to review them.  We understand that in some cases there may be results that are confusing or concerning to you. Not all laboratory results come back in the same time frame and the provider may be waiting for multiple results in order to interpret others.  Please give [place] 48 hours in order for your provider to thoroughly review all the results before contacting the office for clarification of your results.   Thank you for choosing me and [facility].  Dr. Rush Landmark

## 2020-05-08 ENCOUNTER — Encounter: Payer: Self-pay | Admitting: Gastroenterology

## 2020-05-08 DIAGNOSIS — Z1211 Encounter for screening for malignant neoplasm of colon: Secondary | ICD-10-CM | POA: Insufficient documentation

## 2020-05-08 DIAGNOSIS — K649 Unspecified hemorrhoids: Secondary | ICD-10-CM | POA: Insufficient documentation

## 2020-05-08 NOTE — Progress Notes (Signed)
GASTROENTEROLOGY OUTPATIENT CLINIC VISIT   Primary Care Provider Maryland Pink, MD [address] [place] [address] [phone]  Referring Provider Maryland Pink, MD [address] [place],  [place] [address] [phone]  Patient Profile: Jonathan Nylen. is a 65 y.o. male with a pmh significant for nephrolithiasis, bladder stones, hypertension, hemorrhoids (status post hemorrhoidectomy).  The patient presents to the [facility] for an evaluation and management of problem(s) noted below:  Problem List 1. Colon cancer screening   2. Hemorrhoids, unspecified hemorrhoid type     History of Present Illness This is the patient's first visit to the outpatient [facility].  He believes he underwent a colonoscopy in 2012 which was normal per his report.  February, the patient presented to [facility] surgery setting of having prolapse of his hemorrhoids with bowel movement as well as a frequent bright red blood per rectum.  He has not been on fiber or stool softeners but was having significant discomfort.  Patient was found to have grade 4 internal hemorrhoids.  Surgical intervention was performed due to the significant discomfort the patient was experiencing with plan for follow-up again daily for colon cancer screening.  Has been doing better since his procedure but is still having some bleeding and some mild discharge.  No fevers or chills.  He is scheduled to see his surgeon next week.  There is no family history of GI cancers or malignancies.  He denies ever undergoing an upper endoscopy.  GI Review of Systems Positive as above Negative for pyrosis, dysphagia, odynophagia, nausea, vomiting, abdominal pain, melena  Review of Systems General: Denies fevers/chills/weight loss unintentionally Cardiovascular: Denies chest pain/palpitations Pulmonary: Denies shortness of breath Gastroenterological: See  HPI Genitourinary: Denies darkened urine  Hematological: Denies easy bruising/bleeding Endocrine: Denies temperature intolerance Dermatological: Denies jaundice Psychological: Mood is stable   Medications Current Outpatient Medications  Medication Sig Dispense Refill  . azelastine (ASTELIN) 0.1 % nasal spray Place 2 sprays into both nostrils daily as needed (vertigo). Use in each nostril as directed    . docusate sodium (COLACE) 100 MG capsule Take 1 capsule (100 mg total) by mouth 2 (two) times daily. Take 1 tablet once or twice daily as needed for constipation while taking narcotic pain medicine 60 capsule 2  . losartan (COZAAR) 25 MG tablet Take 25 mg by mouth daily.    . meclizine (ANTIVERT) 25 MG tablet Take 25 mg by mouth 3 (three) times daily as needed (vertigo).    . Multiple Vitamin (MULTIVITAMIN WITH MINERALS) TABS tablet Take 1 tablet by mouth daily.    . ondansetron (ZOFRAN ODT) 4 MG disintegrating tablet Allow 1-2 tablets to dissolve in your mouth every 8 hours as needed for nausea/vomiting 30 tablet 0  . polycarbophil (FIBERCON) 625 MG tablet Take 1 tablet (625 mg total) by mouth daily. 30 tablet 2  . polyethylene glycol (MIRALAX / GLYCOLAX) 17 g packet Take 17 g by mouth daily as needed (constipation). 14 each 0  . Saw Palmetto, Serenoa repens, (SAW PALMETTO PO) Take 2 capsules by mouth daily.    . phenylephrine-shark liver oil-mineral oil-petrolatum (PREPARATION H) 0.25-14-74.9 % rectal ointment Place 1 application rectally in the morning and at bedtime.     No current facility-administered medications for this visit.    Allergies Allergies  Allergen Reactions  . Penicillins Hives    Has patient had a PCN reaction causing immediate rash, facial/tongue/throat swelling, SOB or lightheadedness  with hypotension: No Has patient had a PCN reaction causing severe rash involving mucus membranes or skin necrosis: No Has patient had a PCN reaction that required hospitalization:  No Has patient had a PCN reaction occurring within the last 10 years: Unknown If all of the above answers are "NO", then may proceed with Cephalosporin use.   . Contrast Media [Iodinated Diagnostic Agents] Other (See Comments)    migraine     Histories Past Medical History:  Diagnosis Date  . Bladder stones   . GERD (gastroesophageal reflux disease)   . Hemorrhoids   . History of kidney stones   . Hypertension    Past Surgical History:  Procedure Laterality Date  . CYSTOSCOPY WITH LITHOLAPAXY N/A 03/27/2019   Procedure: CYSTOSCOPY WITH LITHOLAPAXY;  Surgeon: Abbie Sons, MD;  Location: [facility];  Service: Urology;  Laterality: N/A;  . EVALUATION UNDER ANESTHESIA WITH HEMORRHOIDECTOMY N/A 04/17/2020   Procedure: HEMORRHOIDECTOMY;  Surgeon: Dwan Bolt, MD;  Location: [facility];  Service: General;  Laterality: N/A;  . EXTRACORPOREAL SHOCK WAVE LITHOTRIPSY Left 08/02/2019   Procedure: EXTRACORPOREAL SHOCK WAVE LITHOTRIPSY (ESWL);  Surgeon: Abbie Sons, MD;  Location: [facility];  Service: Urology;  Laterality: Left;  . HAND SURGERY    . HAND SURGERY Bilateral   . RECTAL EXAM UNDER ANESTHESIA  04/17/2020   Procedure: RECTAL EXAM UNDER ANESTHESIA;  Surgeon: Dwan Bolt, MD;  Location: [facility];  Service: General;;  . TENNIS ELBOW RELEASE/NIRSCHEL PROCEDURE Bilateral 04/14/2017   Procedure: TENNIS ELBOW RELEASE/NIRSCHEL PROCEDURE;  Surgeon: Hessie Knows, MD;  Location: [facility];  Service: Orthopedics;  Laterality: Bilateral;  . TENNIS ELBOW RELEASE/NIRSCHEL PROCEDURE Right 01/03/2018   Procedure: TENNIS ELBOW RELEASE/NIRSCHEL PROCEDURE;  Surgeon: Hessie Knows, MD;  Location: [facility];  Service: Orthopedics;  Laterality: Right;  . TONSILLECTOMY     Social History   Socioeconomic History  . Marital status: Married    Spouse name: Not on file  . Number of children: Not on file  . Years of education: Not on file  . Highest education level: Not on file  Occupational History  .  Not on file  Tobacco Use  . Smoking status: Former Smoker    Packs/day: 2.00    Years: 12.00    Pack years: 24.00    Types: Cigarettes    Quit date: 03/24/1986    Years since quitting: 34.1  . Smokeless tobacco: Never Used  Vaping Use  . Vaping Use: Never used  Substance and Sexual Activity  . Alcohol use: Yes    Alcohol/week: 0.0 standard drinks    Comment: rarely  . Drug use: No  . Sexual activity: Not on file  Other Topics Concern  . Not on file  Social History Narrative  . Not on file   Social Determinants of Health   Financial Resource Strain: Not on file  Food Insecurity: Not on file  Transportation Needs: Not on file  Physical Activity: Not on file  Stress: Not on file  Social Connections: Not on file  Intimate Partner Violence: Not on file   Family History  Problem Relation Age of Onset  . Prostate cancer Father   . Bladder Cancer Neg Hx   . Kidney cancer Neg Hx   . Colon cancer Neg Hx   . Esophageal cancer Neg Hx   . Inflammatory bowel disease Neg Hx   . Liver disease Neg Hx   . Rectal cancer Neg Hx   . Stomach cancer Neg Hx   .  Pancreatic cancer Neg Hx    I have reviewed his medical, social, and family history in detail and updated the electronic medical record as necessary.    PHYSICAL EXAMINATION  BP 118/80   Pulse 75   Ht 6\' 4"  (1.93 m)   Wt 237 lb (107.5 kg)   BMI 28.85 kg/m  Wt Readings from Last 3 Encounters:  05/06/20 237 lb (107.5 kg)  04/17/20 233 lb 14.5 oz (106.1 kg)  08/23/19 224 lb (101.6 kg)  GEN: NAD, appears stated age, doesn't appear chronically ill PSYCH: Cooperative, without pressured speech EYE: Conjunctivae pink, sclerae anicteric ENT: Masked CV: Nontachycardic RESP: No audible wheezing GI: NABS, soft, NT/ND, without rebound or guarding GU: Offered to patient but deferred as he will be having follow-up with surgery next week MSK/EXT: No lower extremity edema SKIN: No jaundice NEURO:  Alert & Oriented x 3, no focal  deficits   REVIEW OF DATA  I reviewed the following data at the time of this encounter:  GI Procedures and Studies  Reports a normal 2012 colonoscopy for which we will try to obtain records  Laboratory Studies  Reviewed those in epic  Imaging Studies  No relevant studies to review   ASSESSMENT  Mr. Chartrand is a 65 y.o. male with a pmh significant for nephrolithiasis, bladder stones, hypertension, hemorrhoids (status post hemorrhoidectomy).  The patient is seen today for evaluation and management of:  1. Colon cancer screening   2. Hemorrhoids, unspecified hemorrhoid type    The patient is hemodynamically stable.  He has undergone hemorrhoidectomy and is still healing at this time.  He will be following up with his surgeon next week.  Needs updated colon cancer screening.  D we will the coming weeks.  He will continue stool softeners as well as an addition of FiberCon.  The risks and benefits of endoscopic evaluation were discussed with the patient; these include but are not limited to the risk of perforation, infection, bleeding, missed lesions, lack of diagnosis, severe illness requiring hospitalization, as well as anesthesia and sedation related illnesses.  The patient is agreeable to proceed.  All patient questions were answered to the best of my ability, and the patient agrees to the aforementioned plan of action with follow-up as indicated.   PLAN  Continue stool softeners Initiate FiberCon MiraLAX as needed Follow-up with surgery post hemorrhoidectomy Colonoscopy for colon cancer screening to be performed in the coming months (after he has continued time to heal)   Orders Placed This Encounter  Procedures  . Ambulatory referral to Gastroenterology    New Prescriptions   No medications on file   Modified Medications   No medications on file    Planned Follow Up No follow-ups on file.   Total Time in Face-to-Face and in Coordination of Care for patient including  independent/personal interpretation/review of prior testing, medical history, examination, medication adjustment, communicating results with the patient directly, and documentation with the EHR is 30 minutes.   Justice Britain, MD [facility] Office # [phone]

## 2020-07-11 ENCOUNTER — Encounter: Payer: BC Managed Care – PPO | Admitting: Gastroenterology

## 2020-07-29 ENCOUNTER — Ambulatory Visit: Payer: Self-pay | Admitting: Surgery

## 2020-07-29 NOTE — H&P (View-Only) (Signed)
Subjective:    CC: Umbilical hernia without obstruction and without gangrene [K42.9]   HPI:  Jonathan Andrews is a 65 y.o. male who was referred by Dionicia Abler, PA for evaluation of above. Symptoms were first noted 1 years ago. Increasing in size, and consistent bulge now.  Pain is sharp and intermittent, confined to the periumbilical area, without radiation.  Associated with nothing specific, exacerbated by exertion  Lump is reducible.    Past Medical History:  has a past medical history of GERD (gastroesophageal reflux disease), Hemorrhoids, Kidney stones, and Vertigo.   Past Surgical History:       Past Surgical History:  Procedure Laterality Date   COLONOSCOPY   12/2010   hand surgery Left 2009    flexor tendon repair   hand surgery Right 01/2013    5th metacarpal pinning   Tennis elbow release/NIRSCHEL procedure  Bilateral 04/14/2017    Dr.Menz      Family History: family history includes Prostate cancer (age of onset: 66) in his father.   Social History:  reports that he quit smoking about 34 years ago. He has never used smokeless tobacco. He reports current alcohol use. He reports that he does not use drugs.   Current Medications: has a current medication list which includes the following prescription(s): azelastine, fibercon, cetirizine, fluticasone propionate, losartan, multivitamin, ondansetron, saw palmetto, acetaminophen, docusate, and meclizine.   Allergies:       Allergies as of 07/29/2020 - Reviewed 07/29/2020  Allergen Reaction Noted   Penicillins Hives and Other (See Comments) 08/09/2013   Iodinated contrast media Other (See Comments) 02/12/2019      ROS:  A 15 point review of systems was performed and pertinent positives and negatives noted in HPI   Objective:    BP (!) 144/94   Pulse 70   Ht 190.5 cm (6\' 3" )   Wt (!) 107.5 kg (237 lb)   BMI 29.62 kg/m    Constitutional :  alert, appears stated age, cooperative and no distress  Lymphatics/Throat:   no asymmetry, masses, or scars  Respiratory:  clear to auscultation bilaterally  Cardiovascular:  regular rate and rhythm  Gastrointestinal: soft, non-tender; bowel sounds normal; no masses,  no organomegaly. umbilical hernia noted.  moderate, reducible and no overlying skin changes.  Also asymptomatic left inguinal hernia, moderate, reducible, no overlyling skin changes  Musculoskeletal: Steady gait and movement  Skin: Cool and moist, no visible surgical scars   Psychiatric: Normal affect, non-agitated, not confused         LABS:  n/a    RADS: CLINICAL DATA:  Hematuria.  Urinary frequency.   EXAM:  CT ABDOMEN AND PELVIS WITHOUT CONTRAST   TECHNIQUE:  Multidetector CT imaging of the abdomen and pelvis was performed  following the standard protocol without IV contrast.   COMPARISON:  CT stone study 06/10/2012   FINDINGS:  Lower chest: Unremarkable   Hepatobiliary: No focal abnormality in the liver on this study  without intravenous contrast. There is no evidence for gallstones,  gallbladder wall thickening, or pericholecystic fluid. No  intrahepatic or extrahepatic biliary dilation.   Pancreas: No focal mass lesion. No dilatation of the main duct. No  intraparenchymal cyst. No peripancreatic edema.   Spleen: No splenomegaly. No focal mass lesion.   Adrenals/Urinary Tract: No adrenal nodule or mass.   3.7 cm homogeneous lesion posterior interpolar right kidney  approaches water attenuation. This was 2.3 cm on the study from 6  years ago and likely represents a renal  cyst. 1-2 mm nonobstructing  stone lower pole right kidney really only clearly visualized on  coronal image 51/6. No right ureteral stone.   8 cm interpolar lesion in the left kidney has increased from 6 cm  previously. This lesion measures water density and is compatible  with a cyst. 1.9 cm lesion lower pole left kidney posteriorly on  34/2 appears new in the interval. This also measures water   attenuation, compatible with a cyst. 7 mm nonobstructing stone noted  lower pole left kidney. Left ureter unremarkable.   3.1 x 1.8 x 2.8 cm densely calcified stone is identified in the  bladder lumen.   Stomach/Bowel: Stomach is unremarkable. No gastric wall thickening.  No evidence of outlet obstruction. Duodenum is normally positioned  as is the ligament of Treitz. Duodenal diverticulum noted. No small  bowel wall thickening. No small bowel dilatation. The terminal ileum  is normal. The appendix is normal. No gross colonic mass. No colonic  wall thickening. Diverticular changes are noted in the left colon  without evidence of diverticulitis.   Vascular/Lymphatic: There is abdominal aortic atherosclerosis  without aneurysm. There is no gastrohepatic or hepatoduodenal  ligament lymphadenopathy. No retroperitoneal or mesenteric  lymphadenopathy. No pelvic sidewall lymphadenopathy.   Reproductive: The prostate gland and seminal vesicles are  unremarkable.   Other: No intraperitoneal free fluid.   Musculoskeletal: Left groin hernia contains only fat. Umbilical  hernia contains only fat without complicating features. No worrisome  lytic or sclerotic osseous abnormality.   IMPRESSION:  1. Bilateral nonobstructing nephrolithiasis with a large stone  identified in the bladder lumen.  2. Bilateral well-defined homogeneous lesions in both kidneys at or  approaching water attenuation. Some of these were present on the  previous study and have shown mild interval progression. Overall,  features are felt to be most likely related to a renal cysts.  3.  Aortic Atherosclerois (ICD10-170.0)    Electronically Signed    By: Misty Stanley M.D.    On: 02/22/2019 11:40   Assessment:        Umbilical hernia without obstruction and without gangrene [K42.9]  Left inguinal hernia- asymptomatic   Plan:    1. Umbilical hernia without obstruction and without gangrene [K42.9]   Discussed  the risk of surgery including recurrence, which can be up to 50% in the case of incisional or complex hernias, possible use of prosthetic materials (mesh) and the increased risk of mesh infxn if used, bleeding, chronic pain, post-op infxn, post-op SBO or ileus, and possible re-operation to address said risks. The risks of general anesthetic, if used, includes MI, CVA, sudden death or even reaction to anesthetic medications also discussed. Alternatives include continued observation.  Benefits include possible symptom relief, prevention of incarceration, strangulation, enlargement in size over time, and the risk of emergency surgery in the face of strangulation.    Typical post-op recovery time of 3-5 days with 2 weeks of activity restrictions were also discussed.   ED return precautions given for sudden increase in pain, size of hernia with accompanying fever, nausea, and/or vomiting.   The patient verbalized understanding and all questions were answered to the patient's satisfaction.     2. Patient has elected to proceed with surgical treatment. Procedure will be scheduled.  Written consent was obtained.. left inguinal will be robotic assisted laparoscopic, umbilical will be open with mesh

## 2020-07-29 NOTE — H&P (Signed)
Subjective:    CC: Umbilical hernia without obstruction and without gangrene [K42.9]   HPI:  Jonathan Andrews is a 65 y.o. male who was referred by Dionicia Abler, PA for evaluation of above. Symptoms were first noted 1 years ago. Increasing in size, and consistent bulge now.  Pain is sharp and intermittent, confined to the periumbilical area, without radiation.  Associated with nothing specific, exacerbated by exertion  Lump is reducible.    Past Medical History:  has a past medical history of GERD (gastroesophageal reflux disease), Hemorrhoids, Kidney stones, and Vertigo.   Past Surgical History:       Past Surgical History:  Procedure Laterality Date   COLONOSCOPY   12/2010   hand surgery Left 2009    flexor tendon repair   hand surgery Right 01/2013    5th metacarpal pinning   Tennis elbow release/NIRSCHEL procedure  Bilateral 04/14/2017    Dr.Menz      Family History: family history includes Prostate cancer (age of onset: 18) in his father.   Social History:  reports that he quit smoking about 34 years ago. He has never used smokeless tobacco. He reports current alcohol use. He reports that he does not use drugs.   Current Medications: has a current medication list which includes the following prescription(s): azelastine, fibercon, cetirizine, fluticasone propionate, losartan, multivitamin, ondansetron, saw palmetto, acetaminophen, docusate, and meclizine.   Allergies:       Allergies as of 07/29/2020 - Reviewed 07/29/2020  Allergen Reaction Noted   Penicillins Hives and Other (See Comments) 08/09/2013   Iodinated contrast media Other (See Comments) 02/12/2019      ROS:  A 15 point review of systems was performed and pertinent positives and negatives noted in HPI   Objective:    BP (!) 144/94   Pulse 70   Ht 190.5 cm (6\' 3" )   Wt (!) 107.5 kg (237 lb)   BMI 29.62 kg/m    Constitutional :  alert, appears stated age, cooperative and no distress  Lymphatics/Throat:   no asymmetry, masses, or scars  Respiratory:  clear to auscultation bilaterally  Cardiovascular:  regular rate and rhythm  Gastrointestinal: soft, non-tender; bowel sounds normal; no masses,  no organomegaly. umbilical hernia noted.  moderate, reducible and no overlying skin changes.  Also asymptomatic left inguinal hernia, moderate, reducible, no overlyling skin changes  Musculoskeletal: Steady gait and movement  Skin: Cool and moist, no visible surgical scars   Psychiatric: Normal affect, non-agitated, not confused         LABS:  n/a    RADS: CLINICAL DATA:  Hematuria.  Urinary frequency.   EXAM:  CT ABDOMEN AND PELVIS WITHOUT CONTRAST   TECHNIQUE:  Multidetector CT imaging of the abdomen and pelvis was performed  following the standard protocol without IV contrast.   COMPARISON:  CT stone study 06/10/2012   FINDINGS:  Lower chest: Unremarkable   Hepatobiliary: No focal abnormality in the liver on this study  without intravenous contrast. There is no evidence for gallstones,  gallbladder wall thickening, or pericholecystic fluid. No  intrahepatic or extrahepatic biliary dilation.   Pancreas: No focal mass lesion. No dilatation of the main duct. No  intraparenchymal cyst. No peripancreatic edema.   Spleen: No splenomegaly. No focal mass lesion.   Adrenals/Urinary Tract: No adrenal nodule or mass.   3.7 cm homogeneous lesion posterior interpolar right kidney  approaches water attenuation. This was 2.3 cm on the study from 6  years ago and likely represents a renal  cyst. 1-2 mm nonobstructing  stone lower pole right kidney really only clearly visualized on  coronal image 51/6. No right ureteral stone.   8 cm interpolar lesion in the left kidney has increased from 6 cm  previously. This lesion measures water density and is compatible  with a cyst. 1.9 cm lesion lower pole left kidney posteriorly on  34/2 appears new in the interval. This also measures water   attenuation, compatible with a cyst. 7 mm nonobstructing stone noted  lower pole left kidney. Left ureter unremarkable.   3.1 x 1.8 x 2.8 cm densely calcified stone is identified in the  bladder lumen.   Stomach/Bowel: Stomach is unremarkable. No gastric wall thickening.  No evidence of outlet obstruction. Duodenum is normally positioned  as is the ligament of Treitz. Duodenal diverticulum noted. No small  bowel wall thickening. No small bowel dilatation. The terminal ileum  is normal. The appendix is normal. No gross colonic mass. No colonic  wall thickening. Diverticular changes are noted in the left colon  without evidence of diverticulitis.   Vascular/Lymphatic: There is abdominal aortic atherosclerosis  without aneurysm. There is no gastrohepatic or hepatoduodenal  ligament lymphadenopathy. No retroperitoneal or mesenteric  lymphadenopathy. No pelvic sidewall lymphadenopathy.   Reproductive: The prostate gland and seminal vesicles are  unremarkable.   Other: No intraperitoneal free fluid.   Musculoskeletal: Left groin hernia contains only fat. Umbilical  hernia contains only fat without complicating features. No worrisome  lytic or sclerotic osseous abnormality.   IMPRESSION:  1. Bilateral nonobstructing nephrolithiasis with a large stone  identified in the bladder lumen.  2. Bilateral well-defined homogeneous lesions in both kidneys at or  approaching water attenuation. Some of these were present on the  previous study and have shown mild interval progression. Overall,  features are felt to be most likely related to a renal cysts.  3.  Aortic Atherosclerois (ICD10-170.0)    Electronically Signed    By: Misty Stanley M.D.    On: 02/22/2019 11:40   Assessment:        Umbilical hernia without obstruction and without gangrene [K42.9]  Left inguinal hernia- asymptomatic   Plan:    1. Umbilical hernia without obstruction and without gangrene [K42.9]   Discussed  the risk of surgery including recurrence, which can be up to 50% in the case of incisional or complex hernias, possible use of prosthetic materials (mesh) and the increased risk of mesh infxn if used, bleeding, chronic pain, post-op infxn, post-op SBO or ileus, and possible re-operation to address said risks. The risks of general anesthetic, if used, includes MI, CVA, sudden death or even reaction to anesthetic medications also discussed. Alternatives include continued observation.  Benefits include possible symptom relief, prevention of incarceration, strangulation, enlargement in size over time, and the risk of emergency surgery in the face of strangulation.    Typical post-op recovery time of 3-5 days with 2 weeks of activity restrictions were also discussed.   ED return precautions given for sudden increase in pain, size of hernia with accompanying fever, nausea, and/or vomiting.   The patient verbalized understanding and all questions were answered to the patient's satisfaction.     2. Patient has elected to proceed with surgical treatment. Procedure will be scheduled.  Written consent was obtained.. left inguinal will be robotic assisted laparoscopic, umbilical will be open with mesh

## 2020-07-30 ENCOUNTER — Ambulatory Visit: Payer: Self-pay | Admitting: Surgery

## 2020-08-12 ENCOUNTER — Other Ambulatory Visit: Payer: Self-pay

## 2020-08-12 ENCOUNTER — Encounter
Admission: RE | Admit: 2020-08-12 | Discharge: 2020-08-12 | Disposition: A | Discharge status: Home / Self Care | Payer: BC Managed Care – PPO | Source: Ambulatory Visit | Attending: Surgery | Admitting: Surgery

## 2020-08-12 NOTE — Patient Instructions (Signed)
Your procedure is scheduled on: Friday 7/15 Report to .Registration desk then to 2nd floor surgical desk To find out your arrival time please call [phone] between 1PM - 3PM on Thurs 7/14  Remember: Instructions that are not followed completely may result in serious medical risk,  up to and including death, or upon the discretion of your surgeon and anesthesiologist your  surgery may need to be rescheduled.     _X__ 1. Do not eat food after midnight the night before your procedure.                 No chewing gum or hard candies. You may drink clear liquids up to 2 hours                 before you are scheduled to arrive for your surgery- DO not drink clear                 liquids within 2 hours of the start of your surgery.                 Clear Liquids include:  water, apple juice without pulp, clear Gatorade, G2 or                  Gatorade Zero (avoid Red/Purple/Blue), Black Coffee or Tea (Do not add                 anything to coffee or tea).  __X__2.  On the morning of surgery brush your teeth with toothpaste and water, you                may rinse your mouth with mouthwash if you wish.  Do not swallow and toothpaste of mouthwash.     _X__ 3.  No Alcohol for 24 hours before or after surgery.   ___ 4.  Do Not Smoke or use e-cigarettes For 24 Hours Prior to Your Surgery.                 Do not use any chewable tobacco products for at least 6 hours prior to                 Surgery.  __  5.  Do not use any recreational drugs (marijuana, cocaine, heroin, ecstasy,       MDMA or other) For at least one week prior to your surgery.            Combination of these drugs with anesthesia may have life threatening       results.  ____  6.  Bring all medications with you on the day of surgery if instructed.   __x__  7.  Notify your doctor if there is any change in your medical condition      (cold, fever, infections).     Do not wear jewelry,  Do not wear lotions,   Do not shave 48 hours prior to surgery. Men may shave face and neck. Do not bring valuables to the hospital.    [facility] is not responsible for any belongings or valuables.  Contacts, dentures or bridgework may not be worn into surgery. Leave your suitcase in the car. After surgery it may be brought to your room. For patients admitted to the hospital, discharge time is determined by your treatment team.   Patients discharged the day of surgery will not be allowed to drive home.   Make arrangements for someone to be with you for  the first 24 hours of your Same Day Discharge.    Please read over the following fact sheets that you were given:      __x__ Take these medicines the morning of surgery with A SIP OF WATER:    1. Allergy medication if necessary  2.   3.   4.  5.  6.  ____ Fleet Enema (as directed)   __x__ Shower the night before and the morning of surgery.  ____ Use Benzoyl Peroxide Gel as instructed  ____ Use inhalers on the day of surgery  ____ Stop metformin 2 days prior to surgery    ____ Take 1/2 of usual insulin dose the night before surgery. No insulin the morning          of surgery.   ____ Stop Coumadin/Plavix/aspirin on   ____ Stop Anti-inflammatories on    ___x_ Stop supplements until after surgery.  Saw Palmetto, Serenoa repens, (SAW PALMETTO PO)  ____ Bring C-Pap to the hospital.    If you have any questions regarding your pre-procedure instructions,  Please call Pre-admit Testing at [phone]

## 2020-08-15 ENCOUNTER — Ambulatory Visit (HOSPITAL_COMMUNITY): Admit: 2020-08-15 | Payer: BC Managed Care – PPO | Admitting: Gastroenterology

## 2020-08-15 ENCOUNTER — Emergency Department (HOSPITAL_COMMUNITY): Payer: BC Managed Care – PPO

## 2020-08-15 ENCOUNTER — Ambulatory Visit (AMBULATORY_SURGERY_CENTER): Payer: BC Managed Care – PPO | Admitting: Gastroenterology

## 2020-08-15 ENCOUNTER — Emergency Department (HOSPITAL_COMMUNITY)
Admission: EM | Admit: 2020-08-15 | Discharge: 2020-08-15 | Disposition: A | Discharge status: Home / Self Care | Payer: BC Managed Care – PPO | Attending: Gastroenterology | Admitting: Gastroenterology

## 2020-08-15 ENCOUNTER — Emergency Department (HOSPITAL_COMMUNITY): Payer: BC Managed Care – PPO | Admitting: Certified Registered Nurse Anesthetist

## 2020-08-15 ENCOUNTER — Encounter: Payer: Self-pay | Admitting: Gastroenterology

## 2020-08-15 ENCOUNTER — Other Ambulatory Visit: Payer: Self-pay

## 2020-08-15 ENCOUNTER — Encounter (HOSPITAL_COMMUNITY)
Admission: EM | Disposition: A | Discharge status: Home / Self Care | Payer: Self-pay | Source: Home / Self Care | Attending: Emergency Medicine

## 2020-08-15 ENCOUNTER — Encounter (HOSPITAL_COMMUNITY): Payer: Self-pay

## 2020-08-15 VITALS — BP 131/46 | HR 50 | Temp 98.2°F | Resp 20 | Ht 76.0 in | Wt 237.0 lb

## 2020-08-15 DIAGNOSIS — R1084 Generalized abdominal pain: Secondary | ICD-10-CM

## 2020-08-15 DIAGNOSIS — K5730 Diverticulosis of large intestine without perforation or abscess without bleeding: Secondary | ICD-10-CM | POA: Insufficient documentation

## 2020-08-15 DIAGNOSIS — D122 Benign neoplasm of ascending colon: Secondary | ICD-10-CM

## 2020-08-15 DIAGNOSIS — Q438 Other specified congenital malformations of intestine: Secondary | ICD-10-CM | POA: Diagnosis not present

## 2020-08-15 DIAGNOSIS — Z885 Allergy status to narcotic agent status: Secondary | ICD-10-CM | POA: Diagnosis not present

## 2020-08-15 DIAGNOSIS — Z91041 Radiographic dye allergy status: Secondary | ICD-10-CM | POA: Diagnosis not present

## 2020-08-15 DIAGNOSIS — Y838 Other surgical procedures as the cause of abnormal reaction of the patient, or of later complication, without mention of misadventure at the time of the procedure: Secondary | ICD-10-CM | POA: Insufficient documentation

## 2020-08-15 DIAGNOSIS — D128 Benign neoplasm of rectum: Secondary | ICD-10-CM | POA: Diagnosis not present

## 2020-08-15 DIAGNOSIS — K648 Other hemorrhoids: Secondary | ICD-10-CM | POA: Diagnosis not present

## 2020-08-15 DIAGNOSIS — K921 Melena: Secondary | ICD-10-CM | POA: Diagnosis present

## 2020-08-15 DIAGNOSIS — D125 Benign neoplasm of sigmoid colon: Secondary | ICD-10-CM

## 2020-08-15 DIAGNOSIS — K9189 Other postprocedural complications and disorders of digestive system: Secondary | ICD-10-CM | POA: Insufficient documentation

## 2020-08-15 DIAGNOSIS — K635 Polyp of colon: Secondary | ICD-10-CM

## 2020-08-15 DIAGNOSIS — R140 Abdominal distension (gaseous): Secondary | ICD-10-CM

## 2020-08-15 DIAGNOSIS — Z87891 Personal history of nicotine dependence: Secondary | ICD-10-CM | POA: Insufficient documentation

## 2020-08-15 DIAGNOSIS — Z880 Allergy status to penicillin: Secondary | ICD-10-CM | POA: Insufficient documentation

## 2020-08-15 DIAGNOSIS — I10 Essential (primary) hypertension: Secondary | ICD-10-CM | POA: Diagnosis not present

## 2020-08-15 DIAGNOSIS — D123 Benign neoplasm of transverse colon: Secondary | ICD-10-CM | POA: Diagnosis not present

## 2020-08-15 DIAGNOSIS — Z1211 Encounter for screening for malignant neoplasm of colon: Secondary | ICD-10-CM

## 2020-08-15 DIAGNOSIS — Z8042 Family history of malignant neoplasm of prostate: Secondary | ICD-10-CM | POA: Insufficient documentation

## 2020-08-15 HISTORY — PX: COLONOSCOPY: SHX5424

## 2020-08-15 LAB — COMPREHENSIVE METABOLIC PANEL
ALT: 34 U/L (ref 0–44)
AST: 28 U/L (ref 15–41)
Albumin: 4.2 g/dL (ref 3.5–5.0)
Alkaline Phosphatase: 55 U/L (ref 38–126)
Anion gap: 7 (ref 5–15)
BUN: 11 mg/dL (ref 8–23)
CO2: 22 mmol/L (ref 22–32)
Calcium: 8.7 mg/dL — ABNORMAL LOW (ref 8.9–10.3)
Chloride: 109 mmol/L (ref 98–111)
Creatinine, Ser: 1.14 mg/dL (ref 0.61–1.24)
GFR, Estimated: >60 mL/min (ref >60)
Glucose, Bld: 105 mg/dL — ABNORMAL HIGH (ref 70–99)
Potassium: 3.9 mmol/L (ref 3.5–5.1)
Sodium: 138 mmol/L (ref 135–145)
Total Bilirubin: 0.7 mg/dL (ref 0.3–1.2)
Total Protein: 7.2 g/dL (ref 6.5–8.1)

## 2020-08-15 LAB — CBC WITH DIFFERENTIAL/PLATELET
Abs Immature Granulocytes: 0.01 10*3/uL (ref 0.00–0.07)
Basophils Absolute: 0 10*3/uL (ref 0.0–0.1)
Basophils Relative: 0 %
Eosinophils Absolute: 0 10*3/uL (ref 0.0–0.5)
Eosinophils Relative: 1 %
HCT: 47.1 % (ref 39.0–52.0)
Hemoglobin: 15.4 g/dL (ref 13.0–17.0)
Immature Granulocytes: 0 %
Lymphocytes Relative: 18 %
Lymphs Abs: 0.9 10*3/uL (ref 0.7–4.0)
MCH: 32.6 pg (ref 26.0–34.0)
MCHC: 32.7 g/dL (ref 30.0–36.0)
MCV: 99.8 fL (ref 80.0–100.0)
Monocytes Absolute: 0.4 10*3/uL (ref 0.1–1.0)
Monocytes Relative: 9 %
Neutro Abs: 3.5 10*3/uL (ref 1.7–7.7)
Neutrophils Relative %: 72 %
Platelets: 162 10*3/uL (ref 150–400)
RBC: 4.72 MIL/uL (ref 4.22–5.81)
RDW: 12.5 % (ref 11.5–15.5)
WBC: 4.9 10*3/uL (ref 4.0–10.5)
nRBC: 0 % (ref 0.0–0.2)

## 2020-08-15 LAB — LIPASE, BLOOD: Lipase: 32 U/L (ref 11–51)

## 2020-08-15 SURGERY — COLONOSCOPY WITH PROPOFOL
Anesthesia: Monitor Anesthesia Care

## 2020-08-15 SURGERY — COLONOSCOPY
Anesthesia: Moderate Sedation

## 2020-08-15 MED ORDER — SODIUM CHLORIDE 0.9 % IV SOLN: 500.0000 mL | Freq: Once | INTRAVENOUS | Status: DC

## 2020-08-15 MED ORDER — MIDAZOLAM HCL 5 MG/5ML IJ SOLN
INTRAMUSCULAR | Status: DC | PRN
  Administered 2020-08-15 (×2): 1 mg via INTRAVENOUS
  Administered 2020-08-15 (×4): 2 mg via INTRAVENOUS

## 2020-08-15 MED ORDER — FENTANYL CITRATE (PF) 100 MCG/2ML IJ SOLN
INTRAMUSCULAR | Status: AC
  Filled 2020-08-15: qty 4

## 2020-08-15 MED ORDER — MIDAZOLAM HCL (PF) 5 MG/ML IJ SOLN
INTRAMUSCULAR | Status: AC
  Filled 2020-08-15: qty 2

## 2020-08-15 MED ORDER — FENTANYL CITRATE (PF) 100 MCG/2ML IJ SOLN
25.0000 ug | Freq: Once | INTRAMUSCULAR | Status: AC
  Administered 2020-08-15: 25 ug via INTRAVENOUS

## 2020-08-15 MED ORDER — DIPHENHYDRAMINE HCL 50 MG/ML IJ SOLN
INTRAMUSCULAR | Status: AC
  Filled 2020-08-15: qty 1

## 2020-08-15 MED ORDER — SODIUM CHLORIDE 0.9 % IV BOLUS
1000.0000 mL | Freq: Once | INTRAVENOUS | Status: AC
  Administered 2020-08-15: 1000 mL via INTRAVENOUS

## 2020-08-15 MED ORDER — FENTANYL CITRATE (PF) 100 MCG/2ML IJ SOLN
50.0000 ug | Freq: Once | INTRAMUSCULAR | Status: AC
  Administered 2020-08-15: 50 ug via INTRAVENOUS
  Filled 2020-08-15: qty 2

## 2020-08-15 MED ORDER — PROPOFOL 10 MG/ML IV BOLUS
INTRAVENOUS | Status: DC | PRN
  Administered 2020-08-15 (×4): 50 mg via INTRAVENOUS

## 2020-08-15 MED ORDER — FENTANYL CITRATE (PF) 100 MCG/2ML IJ SOLN
INTRAMUSCULAR | Status: DC | PRN
  Administered 2020-08-15 (×4): 25 ug via INTRAVENOUS

## 2020-08-15 MED ORDER — LACTATED RINGERS IV SOLN: INTRAVENOUS | Status: DC | PRN

## 2020-08-15 MED ORDER — PROPOFOL 10 MG/ML IV BOLUS
INTRAVENOUS | Status: AC
  Filled 2020-08-15: qty 20

## 2020-08-15 NOTE — ED Provider Notes (Signed)
Patient signed out to me by previous provider. Please refer to their note for full HPI. Briefly this is a 65 year old male with recent colonoscopy who presented with abdominal distention. CT found gaseous distention, 2/2 to colonoscopy. Patient signed out pending GI evaluation.  Physical Exam  BP (!) 171/97   Pulse (!) 58   Temp 98 F (36.7 C) (Oral)   Resp 20   SpO2 100%   Physical Exam Vitals and nursing note reviewed.  Constitutional:      Appearance: Normal appearance.  HENT:     Head: Normocephalic.     Mouth/Throat:     Mouth: Mucous membranes are moist.  Cardiovascular:     Rate and Rhythm: Normal rate.  Pulmonary:     Effort: Pulmonary effort is normal. No respiratory distress.  Abdominal:     General: Bowel sounds are normal. There is no distension.     Palpations: Abdomen is soft.     Tenderness: There is no abdominal tenderness.  Skin:    General: Skin is warm.  Neurological:     Mental Status: He is alert and oriented to person, place, and time. Mental status is at baseline.  Psychiatric:        Mood and Affect: Mood normal.    ED Course/Procedures     Procedures  MDM   GI evaluated the patient and took him to the endoscopy suite for decompression.  Patient tolerated the procedure well, decompression was successful.  On my reevaluation patient feels well and is requesting to be discharged home.  Patient will be discharged and treated as an outpatient.  Discharge plan and strict return to ED precautions discussed, patient verbalizes understanding and agreement.       Lorelle Gibbs, DO 08/15/20 9747

## 2020-08-15 NOTE — Interval H&P Note (Signed)
History and Physical Interval Note:  08/15/2020 4:23 PM  Jonathan Andrews.  has presented today for surgery, with the diagnosis of abdominal distention.  The various methods of treatment have been discussed with the patient and family. After consideration of risks, benefits and other options for treatment, the patient has consented to  Procedure(s): COLONOSCOPY (N/A) as a surgical intervention.  The patient's history has been reviewed, patient examined, no change in status, stable for surgery.  I have reviewed the patient's chart and labs.  Questions were answered to the patient's satisfaction.     Nelida Meuse III

## 2020-08-15 NOTE — Progress Notes (Signed)
Combining Notes from the re-evaluation post-procedure at this time:   Patient having 10/10 pain with significant abdominal distention initially on evaluation post-procedure. Performed rectal examination in attempt of trying to pass gas. Patient administered Levsin. Patient transfers from left lateral into all-fours in effort of passage of gas. 25 mcg Fentanyl administered. Patient reassesed and now discomfort down to 5/10. Describes issues of pain in lower abdomen/umbilicus in past consistent somewhat with his umbilical hernia pain for which he has an upcoming surgical intervention within the next few weeks in [place]. Has passed more gas. Hemodynamically stable. Abdomen less distended and patient able to communicate and talk at this time. WIll administer another dose of 25 mcg of Fentanyl and reassess soon. If patient still having pain will try to get imaging downstairs and if pain persisting significantly he may require evaluation in the ED with a CTAP. Time will tell and will reassess.   Patient re-evalauted with improving pain after 2nd dose of Fentanyl 25 mcg.  Will see if patient can get up and walk slightly to continue to all passage of gas.  If pain persisting will need to get STAT KUB/CXR to rule out free air/perforation.   Unfortunately, the Radiology department at [facility] office is closed today.  I am giving another dose of 25 mcg Fentanyl to see if this continues to aid in pain improvement.  If unable to perform radiology here, will need to send to ED to expedite workup.   Called and spoke with [facility] ED Team Cabin crew) to give patient information and background information.   Patient updated to medical decision that safest option is to be further evaluated in the ED to ensure no post-procedural complication has occured.  Will proceed with EMS to take patient to [facility].   Patient should undergo a CTAP with contrast if able to evaluate for free-air perforation or any complication from the  procedure.    Biggest thought is that patient has had aggravation of his known umbilical hernia/ventral hernia that is planned to be repaired in 1 week.  Need to ensure no evidence of incarceration is occurring in this area though unlikely.   Patient abdomen is much softer than it had been previously, but lateral motion to the right and upward stance still causing patient to have persistent pain/discomfort.   Apprecaite ED team evaluating patient to ensure no evidence of procedural complication.

## 2020-08-15 NOTE — Progress Notes (Signed)
This patient was sent to the ED by Dr. Rush Landmark from our endoscopy lab earlier today for abdominal pain and distention after colonoscopy.  Colonoscopy report reviewed.  Polyps, including a large 1 in the transverse colon biopsied but not removed.  Redundant colon noted.  Patient's vital signs normal.  His abdomen is distended and tympanitic, tender without guarding.  CT abdomen and pelvis reports gaseous distention of a sending transverse colon, no perforation.  I spoke with Dr. Billy Fischer, ED attending seeing this patient.  I recommended ambulating this patient with staff assistance to increase GI motility and help him pass flatus.  If this is not successful in the next couple of hours, ED staff to let us know and patient might require repeat colonoscopy for decompression today.   Wilfrid Lund, MD    [facility]

## 2020-08-15 NOTE — Patient Instructions (Signed)
Please read handouts provided. High fiber diet. Await pathology results. Continue present medications. Continue current fiber supplementation and stool softeners.    YOU HAD AN ENDOSCOPIC PROCEDURE TODAY AT [facility]:   Refer to the procedure report that was given to you for any specific questions about what was found during the examination.  If the procedure report does not answer your questions, please call your gastroenterologist to clarify.  If you requested that your care partner not be given the details of your procedure findings, then the procedure report has been included in a sealed envelope for you to review at your convenience later.  YOU SHOULD EXPECT: Some feelings of bloating in the abdomen. Passage of more gas than usual.  Walking can help get rid of the air that was put into your GI tract during the procedure and reduce the bloating. If you had a lower endoscopy (such as a colonoscopy or flexible sigmoidoscopy) you may notice spotting of blood in your stool or on the toilet paper. If you underwent a bowel prep for your procedure, you may not have a normal bowel movement for a few days.  Please Note:  You might notice some irritation and congestion in your nose or some drainage.  This is from the oxygen used during your procedure.  There is no need for concern and it should clear up in a day or so.  SYMPTOMS TO REPORT IMMEDIATELY:  Following lower endoscopy (colonoscopy or flexible sigmoidoscopy):  Excessive amounts of blood in the stool  Significant tenderness or worsening of abdominal pains  Swelling of the abdomen that is new, acute  Fever of 100F or higher   For urgent or emergent issues, a gastroenterologist can be reached at any hour by calling [phone]. Do not use MyChart messaging for urgent concerns.    DIET:  We do recommend a small meal at first, but then you may proceed to your regular diet.  Drink plenty of fluids but you should avoid  alcoholic beverages for 24 hours.  ACTIVITY:  You should plan to take it easy for the rest of today and you should NOT DRIVE or use heavy machinery until tomorrow (because of the sedation medicines used during the test).    FOLLOW UP: Our staff will call the number listed on your records 48-72 hours following your procedure to check on you and address any questions or concerns that you may have regarding the information given to you following your procedure. If we do not reach you, we will leave a message.  We will attempt to reach you two times.  During this call, we will ask if you have developed any symptoms of COVID 19. If you develop any symptoms (ie: fever, flu-like symptoms, shortness of breath, cough etc.) before then, please call [phone].  If you test positive for Covid 19 in the 2 weeks post procedure, please call and report this information to [place].    If any biopsies were taken you will be contacted by phone or by letter within the next 1-3 weeks.  Please call us at [phone] if you have not heard about the biopsies in 3 weeks.    SIGNATURES/CONFIDENTIALITY: You and/or your care partner have signed paperwork which will be entered into your electronic medical record.  These signatures attest to the fact that that the information above on your After Visit Summary has been reviewed and is understood.  Full responsibility of the confidentiality of this discharge information lies with  you and/or your care-partner.

## 2020-08-15 NOTE — Op Note (Signed)
[place] Patient Name: Jonathan Andrews Procedure Date: 08/15/2020 8:03 AM MRN: [id number] Endoscopist: Justice Britain , MD Age: 65 Referring MD:  Date of Birth: 1955/04/22 Gender: Male Account #: [id number] Procedure:                Colonoscopy Indications:              Screening for colorectal malignant neoplasm,                            Incidental - Hematochezia, Incidental - Hemorrhoids Medicines:                Monitored Anesthesia Care Procedure:                Pre-Anesthesia Assessment:                           - Prior to the procedure, a History and Physical                            was performed, and patient medications and                            allergies were reviewed. The patient's tolerance of                            previous anesthesia was also reviewed. The risks                            and benefits of the procedure and the sedation                            options and risks were discussed with the patient.                            All questions were answered, and informed consent                            was obtained. Prior Anticoagulants: The patient has                            taken no previous anticoagulant or antiplatelet                            agents. ASA Grade Assessment: II - A patient with                            mild systemic disease. After reviewing the risks                            and benefits, the patient was deemed in                            satisfactory condition to undergo the procedure.  After obtaining informed consent, the colonoscope                            was passed under direct vision. Throughout the                            procedure, the patient's blood pressure, pulse, and                            oxygen saturations were monitored continuously. The                            CF [id number] #[id number] was introduced through the anus                            and  advanced to the the cecum, identified by                            appendiceal orifice and ileocecal valve. The                            colonoscopy was performed without difficulty. The                            patient tolerated the procedure. The quality of the                            bowel preparation was adequate. The ileocecal                            valve, appendiceal orifice, and rectum were                            photographed. Scope In: 8:06:57 AM Scope Out: 8:29:17 AM Scope Withdrawal Time: 0 hours 19 minutes 41 seconds  Total Procedure Duration: 0 hours 22 minutes 20 seconds  Findings:                 The digital rectal exam findings include                            hemorrhoids. Pertinent negatives include no                            palpable rectal lesions.                           The colon (entire examined portion) was                            significantly redundant.                           Four sessile polyps were found in the rectum,  sigmoid colon, transverse colon and ascending                            colon. The polyps were 2 to 6 mm in size. These                            polyps were removed with a cold snare. Resection                            and retrieval were complete.                           A 34 mm polyp was found in the mid transverse                            colon. The polyp was semi-sessile. Polypectomy was                            not attempted due to need for time and EMR and                            hospital-based setting. Biopsies were taken with a                            cold forceps for histology. Area was tattooed with                            an injection of Spot (carbon black) on                            contralateral wall.                           Multiple small-mouthed diverticula were found in                            the recto-sigmoid colon and sigmoid colon.                            Normal mucosa was found in the entire colon.                           One 8 mm nodular mucosal region was found in the                            distal rectum on a hemorrhoid - suspect                            inflammatory.                           Non-bleeding non-thrombosed external and internal  hemorrhoids were found during retroflexion, during                            perianal exam and during digital exam. The                            hemorrhoids were Grade II (internal hemorrhoids                            that prolapse but reduce spontaneously). Complications:            No immediate complications. Estimated Blood Loss:     Estimated blood loss was minimal. Impression:               - Hemorrhoids found on digital rectal exam.                           - Redundant colon.                           - Four 2 to 6 mm polyps in the rectum, in the                            sigmoid colon, in the transverse colon and in the                            ascending colon, removed with a cold snare.                            Resected and retrieved.                           - One 34 mm polyp in the mid transverse colon.                            Resection not attempted due to need for EMR.                            Biopsied. Tattooed on contralateral wall.                           - Diverticulosis in the recto-sigmoid colon and in                            the sigmoid colon.                           - Normal mucosa in the entire examined colon                            otherwise.                           - One 8 mm nodular mucosal region was found in the  distal rectum on a hemorrhoid - suspect                            inflammatory.                           - Non-bleeding non-thrombosed external and internal                            hemorrhoids. Recommendation:           - The patient will be observed  post-procedure,                            until all discharge criteria are met.                           - Discharge patient to home.                           - Patient has a contact number available for                            emergencies. The signs and symptoms of potential                            delayed complications were discussed with the                            patient. Return to normal activities tomorrow.                            Written discharge instructions were provided to the                            patient.                           - High fiber diet.                           - Continue current fiber supplementation and stool                            softeners.                           - Await pathology results.                           - Repeat colonoscopy next available for patient to                            perform EMR in hospital-based setting of large TC                            polyp.                           -  The findings and recommendations were discussed                            with the patient.                           - The findings and recommendations were discussed                            with the patient's family. Justice Britain, MD 08/15/2020 8:39:00 AM

## 2020-08-15 NOTE — Progress Notes (Signed)
A and O x3. Report to RN. Tolerated MAC anesthesia well. 

## 2020-08-15 NOTE — Transfer of Care (Signed)
Immediate Anesthesia Transfer of Care Note  Patient: Jonathan Andrews.  Procedure(s) Performed: COLONOSCOPY  Patient Location: PACU and Endoscopy Unit  Anesthesia Type:MAC  Level of Consciousness: awake, alert  and oriented  Airway & Oxygen Therapy: Patient Spontanous Breathing and Patient connected to face mask  Post-op Assessment: Report given to RN and Post -op Vital signs reviewed and stable  Post vital signs: Reviewed and stable  Last Vitals:  Vitals Value Taken Time  BP    Temp    Pulse    Resp    SpO2      Last Pain:  Vitals:   08/15/20 1630  TempSrc:   PainSc: 0-No pain         Complications: No notable events documented.

## 2020-08-15 NOTE — Progress Notes (Signed)
The patient's pain and distention are not improved after ambulating for the last 2 hours in the ED.  I was contacted by the ED physician with the patient's update.  We have brought him to the endoscopy lab and are planning a colonoscopy for decompression.  Risks and benefits of that procedure were explained to him in the presence of the endoscopy nurse and technician and he was agreeable.   The benefits and risks of the planned procedure were described in detail with the patient or (when appropriate) their health care proxy.  Risks were outlined as including, but not limited to, bleeding, infection, perforation, adverse medication reaction leading to cardiac or pulmonary decompensation, pancreatitis (if ERCP).  The limitation of incomplete mucosal visualization was also discussed.  No guarantees or warranties were given.  If all goes well with the procedure and he feels well afterwards, we will return him to the emergency department, from where he can then be discharged home.   Wilfrid Lund, MD    [facility]

## 2020-08-15 NOTE — Progress Notes (Signed)
Per verbal order from Sarita Haver, MD, 25 mcg (0.71mL) administered via IV.  Tobie Poet, RN will continue to monitor patient.

## 2020-08-15 NOTE — Anesthesia Preprocedure Evaluation (Signed)
Anesthesia Evaluation  Patient identified by MRN, date of birth, ID band Patient awake    Reviewed: Allergy & Precautions, NPO status , Patient's Chart, lab work & pertinent test results  History of Anesthesia Complications Negative for: history of anesthetic complications  Airway Mallampati: II  TM Distance: >3 FB Neck ROM: Full    Dental  (+) Dental Advisory Given   Pulmonary former smoker,  04/14/2020 SARS coronavirus NEG   breath sounds clear to auscultation       Cardiovascular hypertension, Pt. on medications (-) angina Rhythm:Regular Rate:Normal     Neuro/Psych negative neurological ROS     GI/Hepatic Neg liver ROS, GERD  Controlled,  Endo/Other  negative endocrine ROS  Renal/GU negative Renal ROS     Musculoskeletal   Abdominal   Peds  Hematology negative hematology ROS (+)   Anesthesia Other Findings   Reproductive/Obstetrics                             Anesthesia Physical  Anesthesia Plan  ASA: II  Anesthesia Plan: MAC   Post-op Pain Management:    Induction: Intravenous  PONV Risk Score and Plan: 2 and Ondansetron and Dexamethasone  Airway Management Planned: Simple Face Mask  Additional Equipment: None  Intra-op Plan:   Post-operative Plan:   Informed Consent: I have reviewed the patients History and Physical, chart, labs and discussed the procedure including the risks, benefits and alternatives for the proposed anesthesia with the patient or authorized representative who has indicated his/her understanding and acceptance.     Dental advisory given  Plan Discussed with: CRNA and Surgeon  Anesthesia Plan Comments:         Anesthesia Quick Evaluation

## 2020-08-15 NOTE — Progress Notes (Signed)
Per verbal order from G. Mansouraty, MD 25 mcg (0.41mL) of fentanyl was administered via IV.  Tobie Poet, RN will continue to monitor patient and await any additional orders.

## 2020-08-15 NOTE — Anesthesia Postprocedure Evaluation (Signed)
Anesthesia Post Note  Patient: Chip Canepa.  Procedure(s) Performed: COLONOSCOPY     Patient location during evaluation: Endoscopy Anesthesia Type: MAC Level of consciousness: awake and alert Pain management: pain level controlled Vital Signs Assessment: post-procedure vital signs reviewed and stable Respiratory status: spontaneous breathing, nonlabored ventilation and respiratory function stable Cardiovascular status: blood pressure returned to baseline and stable Postop Assessment: no apparent nausea or vomiting Anesthetic complications: no   No notable events documented.  Last Vitals:  Vitals:   08/15/20 1726 08/15/20 1734  BP: 105/60 (!) 105/51  Pulse: 79 78  Resp: 18 18  Temp:    SpO2: 96% 95%    Last Pain:  Vitals:   08/15/20 1734  TempSrc:   PainSc: 0-No pain                 Lynda Rainwater

## 2020-08-15 NOTE — Progress Notes (Signed)
Called to room to assist during endoscopic procedure.  Patient ID and intended procedure confirmed with present staff. Received instructions for my participation in the procedure from the performing physician.  

## 2020-08-15 NOTE — ED Notes (Signed)
Per ENT-states he had a colonoscopy today-still having pain-sending patient for CT scan

## 2020-08-15 NOTE — Op Note (Addendum)
[facility] Patient Name: Jonathan Andrews Procedure Date: 08/15/2020 MRN: [id number] Attending MD: Estill Cotta. Jonathan Andrews , MD Date of Birth: 10-12-1955 CSN: [id number] Age: 65 Admit Type: Emergency Department Procedure:                Colonoscopy Indications:              Generalized abdominal pain, retained air after                            colonoscopy earlier today Providers:                Mallie Mussel L. Jonathan Carrow, MD, Nelia Shi, RN, Brooke                            Person, Laverda Sorenson, Technician, Hinton Dyer Referring MD:              Medicines:                Midazolam 10 mg IV, Fentanyl 100 micrograms IV,then                            Propofol per Anesthesia Complications:            No immediate complications. Estimated Blood Loss:     Estimated blood loss: none. Procedure:                Pre-Anesthesia Assessment:                           - Prior to the procedure, a History and Physical                            was performed, and patient medications and                            allergies were reviewed. The patient's tolerance of                            previous anesthesia was also reviewed. The risks                            and benefits of the procedure and the sedation                            options and risks were discussed with the patient.                            All questions were answered, and informed consent                            was obtained. Prior Anticoagulants: The patient has                            taken no previous anticoagulant or antiplatelet  agents. ASA Grade Assessment: II - A patient with                            mild systemic disease. After reviewing the risks                            and benefits, the patient was deemed in                            satisfactory condition to undergo the procedure.                           After obtaining informed consent, the colonoscope                             was passed under direct vision. Throughout the                            procedure, the patient's blood pressure, pulse, and                            oxygen saturations were monitored continuously. The                            CF-HQ190L ([id number] Olympus colonoscope was                            introduced through the anus and advanced to the the                            cecum, identified by appendiceal orifice and                            ileocecal valve. The colonoscopy was performed with                            difficulty due to a redundant colon, significant                            looping and the patient's combativeness. Successful                            completion of the procedure was aided by Anesthesia                            staff assisting with sedation and using manual                            pressure. The patient tolerated the procedure. Scope In: 4:35:10 PM Scope Out: 5:12:36 PM Scope Withdrawal Time: 0 hours 2 minutes 40 seconds  Total Procedure Duration: 0 hours 37 minutes 26 seconds  Findings:      The perianal and digital rectal examinations were normal.      The sigmoid colon, descending colon  and transverse colon were redundant.       The proximal transverse and ascending colon were distended.      A large polyp and adjacent tattoo were found in the transverse colon.       (as described on colonoscopy report earlier today). The polyp and       remainder of mucosa were not well visualized. Purposeful minimal       insufflation during scope insertion. Decompression performed during       scope withdrawal, leading to resolution of patient's abdominal       distension.      Diverticula were found in the left colon.      The exam was otherwise without abnormality.      Patient could not be adequately sedated with fentanyl and versed due to       scope looping and discomfort. Initially scope only advanced to distal       transverse  colon. Anesthesia became available to give propofol, allowing       abdominal pressure to be applied and scope passage to cecum, allowing       decompression. Impression:               - Redundant colon.                           - One large polyp in the transverse colon.                           - Diverticulosis in the left colon.                           - The examination was otherwise normal.                           - No specimens collected. Moderate Sedation:      Moderate (conscious) sedation was administered by the endoscopy nurse       and supervised by the endoscopist. The following parameters were       monitored: oxygen saturation, heart rate, blood pressure, respiratory       rate, EKG, adequacy of pulmonary ventilation, and response to care. Recommendation:           - Return patient to ED for discharge home.                           - Resume regular diet. Procedure Code(s):        --- Professional ---                           [phone], Colonoscopy, flexible; diagnostic, including                            collection of specimen(s) by brushing or washing,                            when performed (separate procedure) Diagnosis Code(s):        --- Professional ---                           C14.4,  Polyp of colon                           R10.84, Generalized abdominal pain                           Q43.8, Other specified congenital malformations of                            intestine CPT copyright 2019 [employer]. All rights reserved. The codes documented in this report are preliminary and upon coder review may  be revised to meet current compliance requirements. Jonathan Andrews L. Jonathan Carrow, MD 08/15/2020 5:30:16 PM This report has been signed electronically. Number of Addenda: 0

## 2020-08-15 NOTE — Progress Notes (Signed)
Per verbal order from G. Mansouraty, MD, 25 mcg (0.55mL) administered via IV to patient for severe pain s/p colonoscopy.  Patient will continue to be monitored by B. Tessa Lerner, RN in PACU.

## 2020-08-15 NOTE — ED Triage Notes (Signed)
Pt BIB EMS. Pt coming from across the street, pt got colonoscopy done today and started having abd pain 2 hours after procedure, staff had pt to walk around for a bit when pt sat back down pt had a near syncopal episode. Pt is to have umbilical hernia procedure done in a week; pain was near hernia.

## 2020-08-15 NOTE — Discharge Instructions (Addendum)
You have been seen and discharged from the emergency department.  Follow-up with your primary provider and gastroenterologist for reevaluation and further care. Take home medications as prescribed. If you have any worsening symptoms or further concerns for your health please return to an emergency department for further evaluation.

## 2020-08-15 NOTE — ED Notes (Signed)
Patient ambulated twice around both nurses station. Pt is continuing to move while in room.

## 2020-08-15 NOTE — H&P (View-Only) (Signed)
The patient's pain and distention are not improved after ambulating for the last 2 hours in the ED.  I was contacted by the ED physician with the patient's update.  We have brought him to the endoscopy lab and are planning a colonoscopy for decompression.  Risks and benefits of that procedure were explained to him in the presence of the endoscopy nurse and technician and he was agreeable.   The benefits and risks of the planned procedure were described in detail with the patient or (when appropriate) their health care proxy.  Risks were outlined as including, but not limited to, bleeding, infection, perforation, adverse medication reaction leading to cardiac or pulmonary decompensation, pancreatitis (if ERCP).  The limitation of incomplete mucosal visualization was also discussed.  No guarantees or warranties were given.  If all goes well with the procedure and he feels well afterwards, we will return him to the emergency department, from where he can then be discharged home.   Wilfrid Lund, MD    [facility]

## 2020-08-16 NOTE — ED Provider Notes (Signed)
[facility] DEPT Provider Note   CSN: [id number] Arrival date & time: 08/15/20  1145     History Chief Complaint  Patient presents with   Abdominal Pain    Jonathan Andrews. is a 65 y.o. male.  HPI     65yo male with history of hypertension, hemorrhoids, presents with abdominal pain that began 2 hours after his colonoscopy.  Reports he was in the recovery suite, sitting up in bed, when he suddenly developed severe diffuse abdominal pain, and had an episode of near syncope.  Reports he felt nauseous, lightheaded.  Did not have a syncopal episode.  No chest pain or shortness of breath.  Continues to have abdominal pain and distention.  He has not passed flatus since his colonoscopy.  Pain is severe.  No longer feels lightheaded.  Was in a normal state of health prior to this.  Past Medical History:  Diagnosis Date   Allergy    Bladder stones    GERD (gastroesophageal reflux disease)    Hemorrhoids    History of kidney stones    Hypertension     Patient Active Problem List   Diagnosis Date Noted   Colon cancer screening 05/08/2020   Hemorrhoids 05/08/2020   Benign head tremor 11/16/2018   GERD (gastroesophageal reflux disease) 09/18/2015   Internal hemorrhoid, bleeding 06/30/2015   History of urinary stone 07/30/2013   Elevated prostate specific antigen (PSA) 01/26/2012    Past Surgical History:  Procedure Laterality Date   CYSTOSCOPY WITH LITHOLAPAXY N/A 03/27/2019   Procedure: CYSTOSCOPY WITH LITHOLAPAXY;  Surgeon: Abbie Sons, MD;  Location: [facility];  Service: Urology;  Laterality: N/A;   EVALUATION UNDER ANESTHESIA WITH HEMORRHOIDECTOMY N/A 04/17/2020   Procedure: HEMORRHOIDECTOMY;  Surgeon: Dwan Bolt, MD;  Location: [facility];  Service: General;  Laterality: N/A;   EXTRACORPOREAL SHOCK WAVE LITHOTRIPSY Left 08/02/2019   Procedure: EXTRACORPOREAL SHOCK WAVE LITHOTRIPSY (ESWL);  Surgeon: Abbie Sons, MD;  Location: [facility];   Service: Urology;  Laterality: Left;   HAND SURGERY     HAND SURGERY Bilateral    RECTAL EXAM UNDER ANESTHESIA  04/17/2020   Procedure: RECTAL EXAM UNDER ANESTHESIA;  Surgeon: Dwan Bolt, MD;  Location: [facility];  Service: General;;   TENNIS ELBOW RELEASE/NIRSCHEL PROCEDURE Bilateral 04/14/2017   Procedure: TENNIS ELBOW RELEASE/NIRSCHEL PROCEDURE;  Surgeon: Hessie Knows, MD;  Location: [facility];  Service: Orthopedics;  Laterality: Bilateral;   TENNIS ELBOW RELEASE/NIRSCHEL PROCEDURE Right 01/03/2018   Procedure: TENNIS ELBOW RELEASE/NIRSCHEL PROCEDURE;  Surgeon: Hessie Knows, MD;  Location: [facility];  Service: Orthopedics;  Laterality: Right;   TONSILLECTOMY         Family History  Problem Relation Age of Onset   Prostate cancer Father    Bladder Cancer Neg Hx    Kidney cancer Neg Hx    Colon cancer Neg Hx    Esophageal cancer Neg Hx    Inflammatory bowel disease Neg Hx    Liver disease Neg Hx    Rectal cancer Neg Hx    Stomach cancer Neg Hx    Pancreatic cancer Neg Hx     Social History   Tobacco Use   Smoking status: Former    Packs/day: 2.00    Years: 12.00    Pack years: 24.00    Types: Cigarettes    Quit date: 03/24/1986    Years since quitting: 34.4   Smokeless tobacco: Never  Vaping Use   Vaping Use: Never used  Substance Use Topics   Alcohol use: Yes    Alcohol/week: 0.0 standard drinks    Comment: rarely   Drug use: No    Home Medications Prior to Admission medications   Medication Sig Start Date End Date Taking? Authorizing Provider  azelastine (ASTELIN) 0.1 % nasal spray Place 2 sprays into both nostrils daily as needed (vertigo). Use in each nostril as directed   Yes [provider]  bisacodyl (DULCOLAX) 5 MG EC tablet Take 5 mg by mouth daily.   Yes [provider]  cetirizine (ZYRTEC) 10 MG tablet Take 10 mg by mouth daily as needed for allergies.   Yes [provider]  fluticasone (FLONASE) 50 MCG/ACT nasal spray Place 1  spray into both nostrils daily as needed for allergies.   Yes [provider]  losartan (COZAAR) 25 MG tablet Take 25 mg by mouth daily.   Yes [provider]  meclizine (ANTIVERT) 25 MG tablet Take 25 mg by mouth 3 (three) times daily as needed (vertigo).   Yes [provider]  Multiple Vitamin (MULTIVITAMIN WITH MINERALS) TABS tablet Take 1 tablet by mouth daily.   Yes [provider]  ondansetron (ZOFRAN ODT) 4 MG disintegrating tablet Allow 1-2 tablets to dissolve in your mouth every 8 hours as needed for nausea/vomiting 08/01/19  Yes Hinda Kehr, MD  polycarbophil (FIBERCON) 625 MG tablet Take 1 tablet (625 mg total) by mouth daily. 04/17/20  Yes Dwan Bolt, MD  polyethylene glycol (MIRALAX / GLYCOLAX) 17 g packet Take 17 g by mouth daily as needed (constipation). 04/17/20  Yes Dwan Bolt, MD  Saw Palmetto, Serenoa repens, (SAW PALMETTO PO) Take 2 capsules by mouth daily.   Yes [provider]    Allergies    Penicillins, Contrast media [iodinated diagnostic agents], and Hydrocodone-acetaminophen  Review of Systems   Review of Systems  Constitutional:  Negative for fever.  Respiratory:  Negative for cough and shortness of breath.   Cardiovascular:  Negative for chest pain.  Gastrointestinal:  Positive for abdominal distention, abdominal pain and nausea. Negative for vomiting. Diarrhea: had colonoscopy prep last night, not today since procedure. Genitourinary:  Negative for dysuria.  Musculoskeletal:  Neck pain: nothing to vomit.  Neurological:  Positive for light-headedness.   Physical Exam Updated Vital Signs BP 130/89   Pulse (!) 59   Temp (!) 97.5 F (36.4 C) (Axillary)   Resp 16   SpO2 99%   Physical Exam Vitals and nursing note reviewed.  Constitutional:      General: He is not in acute distress.    Appearance: Normal appearance. He is not ill-appearing, toxic-appearing or diaphoretic.  HENT:     Head:  Normocephalic.  Eyes:     Conjunctiva/sclera: Conjunctivae normal.  Cardiovascular:     Rate and Rhythm: Normal rate and regular rhythm.     Pulses: Normal pulses.  Pulmonary:     Effort: Pulmonary effort is normal. No respiratory distress.  Abdominal:     General: Abdomen is protuberant. There is distension.     Tenderness: There is generalized abdominal tenderness.  Musculoskeletal:        General: No deformity or signs of injury.     Cervical back: No rigidity.  Skin:    General: Skin is warm and dry.     Coloration: Skin is not jaundiced or pale.  Neurological:     General: No focal deficit present.     Mental Status: He is alert and oriented  to person, place, and time.    ED Results / Procedures / Treatments   Labs (all labs ordered are listed, but only abnormal results are displayed) Labs Reviewed  COMPREHENSIVE METABOLIC PANEL - Abnormal; Notable for the following components:      Result Value   Glucose, Bld 105 (*)    Calcium 8.7 (*)    All other components within normal limits  CBC WITH DIFFERENTIAL/PLATELET  LIPASE, BLOOD    EKG EKG Interpretation  Date/Time:  Friday August 15 2020 13:15:26 EDT Ventricular Rate:  67 PR Interval:  171 QRS Duration: 113 QT Interval:  419 QTC Calculation: 443 R Axis:   -6 Text Interpretation: Sinus rhythm , artifact Ventricular premature complex Borderline intraventricular conduction delay No significant change since last tracing Confirmed by Gareth Morgan [phone]) on 08/15/2020 3:45:37 PM  Radiology CT ABDOMEN PELVIS WO CONTRAST  Result Date: 08/15/2020 CLINICAL DATA:  Abdominal pain, acute (Ped 0-18y) after colonoscopy, evaluate for perf EXAM: CT ABDOMEN AND PELVIS WITHOUT CONTRAST TECHNIQUE: Multidetector CT imaging of the abdomen and pelvis was performed following the standard protocol without IV contrast. COMPARISON:  CT abdomen pelvis 02/22/2019 FINDINGS: Lower chest: Bibasilar hypoventilatory changes. No acute abnormality.  Hepatobiliary: No focal liver abnormality is seen. No gallstones, gallbladder wall thickening, or biliary dilatation. Pancreas: Unremarkable. No pancreatic ductal dilatation or surrounding inflammatory changes. Spleen: Normal in size without focal abnormality. Adrenals/Urinary Tract: Adrenal glands are unremarkable. No hydronephrosis. Unchanged bilateral renal cysts. No nephrolithiasis. There is unchanged right renal vascular calcifications or small renal calculi. Unchanged appearance of the bladder wall. Stomach/Bowel: The stomach is within normal limits. There is no evidence of bowel obstruction. The appendix is normal. There is gaseous distension of the ascending and transverse colon. The descending and sigmoid colon are decompressed. Scattered colonic diverticuli without evidence of diverticulitis. Vascular/Lymphatic: Minimal aortoiliac atherosclerotic calcifications. No AAA. No lymphadenopathy. Reproductive: Calcified vas deferens.  The prostate is unremarkable. Other: Small fat containing inguinal hernias are unchanged. No bowel containing hernia. There is no abdominopelvic ascites. There is no free intraperitoneal gas. Musculoskeletal: No acute osseous abnormality. No suspicious lytic or blastic lesions. IMPRESSION: No acute abdominopelvic abnormality. Gaseous distension of the ascending and transverse colon. No evidence of free intraperitoneal gas. Aortic Atherosclerosis (ICD10-I70.0). Electronically Signed   By: Maurine Simmering   On: 08/15/2020 13:13    Procedures Procedures   Medications Ordered in ED Medications  sodium chloride 0.9 % bolus 1,000 mL (0 mLs Intravenous Stopped 08/15/20 1545)  fentaNYL (SUBLIMAZE) injection 50 mcg (50 mcg Intravenous Given 08/15/20 1314)    ED Course  I have reviewed the triage vital signs and the nursing notes.  Pertinent labs & imaging results that were available during my care of the patient were reviewed by me and considered in my medical decision making (see  chart for details).    MDM Rules/Calculators/A&P                           65yo male with history of hypertension, hemorrhoids, presents with abdominal pain that began 2 hours after his colonoscopy.    CT abdomen completed which shows gaseous distention of the ascending and transverse colon without evidence of free intraperitoneal gas, or other acute intra-abdominal abnormalities.  Labs completed show no significant abnormalities.  Suspect near syncopal episode was vasovagal in response to pain.  Dr. Loletha Carrow of [facility] gastroenterology came to bedside to evaluate the patient.  Recommends ambulation to assist with movement  of air in setting of gaseous distention of the ascending and transverse colon and likely redundant colon.  After ambulating, patient has had no improvement.  Gastroenterology to reevaluate and possibly take patient for decompression.  Care signed out to Dr. Dina Rich with plan pending  Final Clinical Impression(s) / ED Diagnoses Final diagnoses:  Gaseous abdominal distention    Rx / DC Orders ED Discharge Orders     None        Gareth Morgan, MD 08/16/20 [phone]

## 2020-08-17 ENCOUNTER — Encounter (HOSPITAL_COMMUNITY): Payer: Self-pay | Admitting: Gastroenterology

## 2020-08-18 ENCOUNTER — Telehealth: Payer: Self-pay

## 2020-08-18 NOTE — Telephone Encounter (Signed)
  Follow up Call-  Call back number 08/15/2020  Post procedure Call Back phone  # [phone]  Permission to leave phone message No  Some recent data might be hidden     Patient questions:  Do you have a fever, pain , or abdominal swelling? No. Pain Score  0 *  Have you tolerated food without any problems? Yes.    Have you been able to return to your normal activities? Yes.    Do you have any questions about your discharge instructions: Diet   No. Medications  No. Follow up visit  No.  Do you have questions or concerns about your Care? No.  Actions: * If pain score is 4 or above: No action needed, pain <4.  Have you developed a fever since your procedure? no  2.   Have you had an respiratory symptoms (SOB or cough) since your procedure? no  3.   Have you tested positive for COVID 19 since your procedure no  4.   Have you had any family members/close contacts diagnosed with the COVID 19 since your procedure?  no   If yes to any of these questions please route to Joylene John, RN and Joella Prince, RN

## 2020-08-19 ENCOUNTER — Other Ambulatory Visit: Payer: Self-pay

## 2020-08-19 ENCOUNTER — Telehealth: Payer: Self-pay

## 2020-08-19 ENCOUNTER — Encounter: Payer: Self-pay | Admitting: Gastroenterology

## 2020-08-19 DIAGNOSIS — Z86010 Personal history of colonic polyps: Secondary | ICD-10-CM

## 2020-08-19 MED ORDER — PEG 3350-KCL-NA BICARB-NACL 420 G PO SOLR: 4000.0000 mL | Freq: Once | ORAL | 0 refills | Status: AC

## 2020-08-19 NOTE — Telephone Encounter (Signed)
Per pt colon report dated 7/8 the pt needs colon EMR for large TC polyp with Dr Rush Landmark.

## 2020-08-19 NOTE — Telephone Encounter (Signed)
Colon EMR scheduled on 09/01/20 at 930 am with GM at [facility]   Left message on machine to call back

## 2020-08-19 NOTE — Telephone Encounter (Signed)
The pt has been advised of the colon EMR however, he is unable to keep that date.  I have moved him to 9/19 per pt request.   Instructions mailed.

## 2020-08-22 ENCOUNTER — Encounter: Payer: Self-pay | Admitting: Surgery

## 2020-08-22 ENCOUNTER — Ambulatory Visit: Payer: BC Managed Care – PPO | Admitting: Anesthesiology

## 2020-08-22 ENCOUNTER — Ambulatory Visit
Admission: RE | Admit: 2020-08-22 | Discharge: 2020-08-22 | Disposition: A | Discharge status: Home / Self Care | Payer: BC Managed Care – PPO | Attending: Surgery | Admitting: Surgery

## 2020-08-22 ENCOUNTER — Encounter
Admission: RE | Disposition: A | Discharge status: Home / Self Care | Payer: Self-pay | Source: Home / Self Care | Attending: Surgery

## 2020-08-22 ENCOUNTER — Other Ambulatory Visit: Payer: Self-pay

## 2020-08-22 DIAGNOSIS — K429 Umbilical hernia without obstruction or gangrene: Secondary | ICD-10-CM

## 2020-08-22 DIAGNOSIS — Z79899 Other long term (current) drug therapy: Secondary | ICD-10-CM | POA: Diagnosis not present

## 2020-08-22 DIAGNOSIS — Z880 Allergy status to penicillin: Secondary | ICD-10-CM | POA: Insufficient documentation

## 2020-08-22 DIAGNOSIS — Z91041 Radiographic dye allergy status: Secondary | ICD-10-CM | POA: Diagnosis not present

## 2020-08-22 DIAGNOSIS — K4090 Unilateral inguinal hernia, without obstruction or gangrene, not specified as recurrent: Secondary | ICD-10-CM | POA: Insufficient documentation

## 2020-08-22 DIAGNOSIS — Z87891 Personal history of nicotine dependence: Secondary | ICD-10-CM | POA: Diagnosis not present

## 2020-08-22 HISTORY — PX: UMBILICAL HERNIA REPAIR: SHX196

## 2020-08-22 SURGERY — REPAIR, HERNIA, UMBILICAL, ROBOT-ASSISTED
Anesthesia: General | Site: Abdomen | Laterality: Left

## 2020-08-22 MED ORDER — OXYCODONE HCL 5 MG PO TABS
5.0000 mg | ORAL_TABLET | Freq: Once | ORAL | Status: AC | PRN
  Administered 2020-08-22: 5 mg via ORAL

## 2020-08-22 MED ORDER — SUCCINYLCHOLINE CHLORIDE 20 MG/ML IJ SOLN
INTRAMUSCULAR | Status: DC | PRN
  Administered 2020-08-22: 110 mg via INTRAVENOUS

## 2020-08-22 MED ORDER — FAMOTIDINE 20 MG PO TABS
ORAL_TABLET | ORAL | Status: AC
  Administered 2020-08-22: 20 mg via ORAL
  Filled 2020-08-22: qty 1

## 2020-08-22 MED ORDER — ROCURONIUM BROMIDE 100 MG/10ML IV SOLN
INTRAVENOUS | Status: DC | PRN
  Administered 2020-08-22: 50 mg via INTRAVENOUS
  Administered 2020-08-22: 20 mg via INTRAVENOUS

## 2020-08-22 MED ORDER — LACTATED RINGERS IV SOLN: INTRAVENOUS | Status: DC

## 2020-08-22 MED ORDER — CELECOXIB 200 MG PO CAPS
ORAL_CAPSULE | ORAL | Status: AC
  Administered 2020-08-22: 200 mg via ORAL
  Filled 2020-08-22: qty 1

## 2020-08-22 MED ORDER — OXYCODONE HCL 5 MG PO TABS
ORAL_TABLET | ORAL | Status: AC
  Filled 2020-08-22: qty 1

## 2020-08-22 MED ORDER — PROPOFOL 10 MG/ML IV BOLUS
INTRAVENOUS | Status: AC
  Filled 2020-08-22: qty 20

## 2020-08-22 MED ORDER — ACETAMINOPHEN 500 MG PO TABS: 1000.0000 mg | ORAL_TABLET | ORAL | Status: AC

## 2020-08-22 MED ORDER — OXYCODONE HCL 5 MG/5ML PO SOLN: 5.0000 mg | Freq: Once | ORAL | Status: AC | PRN

## 2020-08-22 MED ORDER — EPHEDRINE SULFATE 50 MG/ML IJ SOLN
INTRAMUSCULAR | Status: DC | PRN
  Administered 2020-08-22: 10 mg via INTRAVENOUS

## 2020-08-22 MED ORDER — CEFAZOLIN SODIUM-DEXTROSE 2-4 GM/100ML-% IV SOLN
INTRAVENOUS | Status: AC
  Filled 2020-08-22: qty 100

## 2020-08-22 MED ORDER — TRAMADOL HCL 50 MG PO TABS: 50.0000 mg | ORAL_TABLET | Freq: Four times a day (QID) | ORAL | 0 refills | Status: AC | PRN

## 2020-08-22 MED ORDER — MIDAZOLAM HCL 2 MG/2ML IJ SOLN
INTRAMUSCULAR | Status: DC | PRN
  Administered 2020-08-22: 2 mg via INTRAVENOUS

## 2020-08-22 MED ORDER — FENTANYL CITRATE (PF) 100 MCG/2ML IJ SOLN
INTRAMUSCULAR | Status: AC
  Filled 2020-08-22: qty 2

## 2020-08-22 MED ORDER — FAMOTIDINE 20 MG PO TABS: 20.0000 mg | ORAL_TABLET | Freq: Once | ORAL | Status: AC

## 2020-08-22 MED ORDER — LIDOCAINE HCL (CARDIAC) PF 100 MG/5ML IV SOSY
PREFILLED_SYRINGE | INTRAVENOUS | Status: DC | PRN
  Administered 2020-08-22: 100 mg via INTRAVENOUS

## 2020-08-22 MED ORDER — DOCUSATE SODIUM 100 MG PO CAPS: 100.0000 mg | ORAL_CAPSULE | Freq: Two times a day (BID) | ORAL | 0 refills | Status: AC | PRN

## 2020-08-22 MED ORDER — MIDAZOLAM HCL 2 MG/2ML IJ SOLN
INTRAMUSCULAR | Status: AC
  Filled 2020-08-22: qty 2

## 2020-08-22 MED ORDER — DEXAMETHASONE SODIUM PHOSPHATE 10 MG/ML IJ SOLN
INTRAMUSCULAR | Status: DC | PRN
  Administered 2020-08-22: 10 mg via INTRAVENOUS

## 2020-08-22 MED ORDER — GLYCOPYRROLATE 0.2 MG/ML IJ SOLN
INTRAMUSCULAR | Status: DC | PRN
  Administered 2020-08-22: .2 mg via INTRAVENOUS

## 2020-08-22 MED ORDER — FENTANYL CITRATE (PF) 100 MCG/2ML IJ SOLN
25.0000 ug | INTRAMUSCULAR | Status: DC | PRN
  Administered 2020-08-22: 25 ug via INTRAVENOUS

## 2020-08-22 MED ORDER — ORAL CARE MOUTH RINSE: 15.0000 mL | Freq: Once | OROMUCOSAL | Status: AC

## 2020-08-22 MED ORDER — SODIUM CHLORIDE 0.9 % IV SOLN
INTRAVENOUS | Status: DC | PRN
  Administered 2020-08-22: 40 mL

## 2020-08-22 MED ORDER — CHLORHEXIDINE GLUCONATE 0.12 % MT SOLN
OROMUCOSAL | Status: AC
  Administered 2020-08-22: 15 mL via OROMUCOSAL
  Filled 2020-08-22: qty 15

## 2020-08-22 MED ORDER — GABAPENTIN 300 MG PO CAPS
ORAL_CAPSULE | ORAL | Status: AC
  Administered 2020-08-22: 300 mg via ORAL
  Filled 2020-08-22: qty 1

## 2020-08-22 MED ORDER — BUPIVACAINE LIPOSOME 1.3 % IJ SUSP
INTRAMUSCULAR | Status: AC
  Filled 2020-08-22: qty 20

## 2020-08-22 MED ORDER — IBUPROFEN 800 MG PO TABS: 800.0000 mg | ORAL_TABLET | Freq: Three times a day (TID) | ORAL | 0 refills | Status: DC | PRN

## 2020-08-22 MED ORDER — CHLORHEXIDINE GLUCONATE CLOTH 2 % EX PADS
6.0000 | MEDICATED_PAD | Freq: Once | CUTANEOUS | Status: AC
  Administered 2020-08-22: 6 via TOPICAL

## 2020-08-22 MED ORDER — ACETAMINOPHEN 500 MG PO TABS
ORAL_TABLET | ORAL | Status: AC
  Administered 2020-08-22: 1000 mg via ORAL
  Filled 2020-08-22: qty 2

## 2020-08-22 MED ORDER — FENTANYL CITRATE (PF) 100 MCG/2ML IJ SOLN
INTRAMUSCULAR | Status: DC | PRN
  Administered 2020-08-22: 25 ug via INTRAVENOUS
  Administered 2020-08-22: 100 ug via INTRAVENOUS
  Administered 2020-08-22: 25 ug via INTRAVENOUS

## 2020-08-22 MED ORDER — GABAPENTIN 300 MG PO CAPS: 300.0000 mg | ORAL_CAPSULE | ORAL | Status: AC

## 2020-08-22 MED ORDER — ACETAMINOPHEN 325 MG PO TABS: 650.0000 mg | ORAL_TABLET | Freq: Three times a day (TID) | ORAL | 0 refills | Status: AC | PRN

## 2020-08-22 MED ORDER — PROPOFOL 10 MG/ML IV BOLUS
INTRAVENOUS | Status: DC | PRN
  Administered 2020-08-22: 200 mg via INTRAVENOUS

## 2020-08-22 MED ORDER — SEVOFLURANE IN SOLN
RESPIRATORY_TRACT | Status: AC
  Filled 2020-08-22: qty 250

## 2020-08-22 MED ORDER — FENTANYL CITRATE (PF) 100 MCG/2ML IJ SOLN
INTRAMUSCULAR | Status: AC
  Administered 2020-08-22: 25 ug via INTRAVENOUS
  Filled 2020-08-22: qty 2

## 2020-08-22 MED ORDER — BUPIVACAINE-EPINEPHRINE 0.5% -1:200000 IJ SOLN
INTRAMUSCULAR | Status: DC | PRN
  Administered 2020-08-22: 30 mL

## 2020-08-22 MED ORDER — CELECOXIB 200 MG PO CAPS: 200.0000 mg | ORAL_CAPSULE | ORAL | Status: AC

## 2020-08-22 MED ORDER — SUGAMMADEX SODIUM 200 MG/2ML IV SOLN
INTRAVENOUS | Status: DC | PRN
  Administered 2020-08-22: 200 mg via INTRAVENOUS

## 2020-08-22 MED ORDER — ONDANSETRON HCL 4 MG/2ML IJ SOLN
INTRAMUSCULAR | Status: DC | PRN
  Administered 2020-08-22: 4 mg via INTRAVENOUS

## 2020-08-22 MED ORDER — CHLORHEXIDINE GLUCONATE 0.12 % MT SOLN: 15.0000 mL | Freq: Once | OROMUCOSAL | Status: AC

## 2020-08-22 MED ORDER — BUPIVACAINE-EPINEPHRINE (PF) 0.5% -1:200000 IJ SOLN
INTRAMUSCULAR | Status: AC
  Filled 2020-08-22: qty 30

## 2020-08-22 MED ORDER — CEFAZOLIN SODIUM-DEXTROSE 2-4 GM/100ML-% IV SOLN
2.0000 g | INTRAVENOUS | Status: AC
  Administered 2020-08-22: 2 g via INTRAVENOUS

## 2020-08-22 SURGICAL SUPPLY — 60 items
ADH SKN CLS APL DERMABOND .7 (GAUZE/BANDAGES/DRESSINGS) ×2
APL PRP STRL LF DISP 70% ISPRP (MISCELLANEOUS) ×2
BLADE SURG SZ11 CARB STEEL (BLADE) ×3 IMPLANT
CANISTER SUCT 1200ML W/VALVE (MISCELLANEOUS) IMPLANT
CANNULA REDUC XI 12-8 STAPL (CANNULA) ×3
CANNULA REDUCER 12-8 DVNC XI (CANNULA) ×2 IMPLANT
CHLORAPREP W/TINT 26 (MISCELLANEOUS) ×3 IMPLANT
COVER TIP SHEARS 8 DVNC (MISCELLANEOUS) ×2 IMPLANT
COVER TIP SHEARS 8MM DA VINCI (MISCELLANEOUS) ×3
DEFOGGER SCOPE WARMER CLEARIFY (MISCELLANEOUS) ×3 IMPLANT
DERMABOND ADVANCED (GAUZE/BANDAGES/DRESSINGS) ×1
DERMABOND ADVANCED .7 DNX12 (GAUZE/BANDAGES/DRESSINGS) ×2 IMPLANT
DRAPE 3/4 80X56 (DRAPES) IMPLANT
DRAPE ARM DVNC X/XI (DISPOSABLE) ×8 IMPLANT
DRAPE COLUMN DVNC XI (DISPOSABLE) ×2 IMPLANT
DRAPE DA VINCI XI ARM (DISPOSABLE) ×12
DRAPE DA VINCI XI COLUMN (DISPOSABLE) ×3
ELECT CAUTERY BLADE 6.4 (BLADE) ×3 IMPLANT
ELECT REM PT RETURN 9FT ADLT (ELECTROSURGICAL) ×3
ELECTRODE REM PT RTRN 9FT ADLT (ELECTROSURGICAL) ×2 IMPLANT
GAUZE 4X4 16PLY ~~LOC~~+RFID DBL (SPONGE) ×3 IMPLANT
GLOVE SURG SYN 6.5 ES PF (GLOVE) ×6 IMPLANT
GLOVE SURG UNDER POLY LF SZ7 (GLOVE) ×6 IMPLANT
GOWN STRL REUS W/ TWL LRG LVL3 (GOWN DISPOSABLE) ×6 IMPLANT
GOWN STRL REUS W/TWL LRG LVL3 (GOWN DISPOSABLE) ×9
GRASPER SUT TROCAR 14GX15 (MISCELLANEOUS) IMPLANT
IRRIGATOR SUCT 8 DISP DVNC XI (IRRIGATION / IRRIGATOR) IMPLANT
IRRIGATOR SUCTION 8MM XI DISP (IRRIGATION / IRRIGATOR)
IV NS 1000ML (IV SOLUTION)
IV NS 1000ML BAXH (IV SOLUTION) IMPLANT
LABEL OR SOLS (LABEL) ×3 IMPLANT
MANIFOLD NEPTUNE II (INSTRUMENTS) ×3 IMPLANT
MESH 3DMAX 4X6 LT LRG (Mesh General) ×1 IMPLANT
MESH 3DMAX MID 4X6 LT LRG (Mesh General) ×2 IMPLANT
MESH VENTRALEX ST 8CM LRG (Mesh General) ×3 IMPLANT
NEEDLE HYPO 22GX1.5 SAFETY (NEEDLE) ×3 IMPLANT
NEEDLE INSUFFLATION 14GA 120MM (NEEDLE) ×3 IMPLANT
OBTURATOR OPTICAL STANDARD 8MM (TROCAR) ×3
OBTURATOR OPTICAL STND 8 DVNC (TROCAR) ×2
OBTURATOR OPTICALSTD 8 DVNC (TROCAR) ×2 IMPLANT
PACK LAP CHOLECYSTECTOMY (MISCELLANEOUS) ×3 IMPLANT
PENCIL ELECTRO HAND CTR (MISCELLANEOUS) ×3 IMPLANT
SEAL CANN UNIV 5-8 DVNC XI (MISCELLANEOUS) ×4 IMPLANT
SEAL XI 5MM-8MM UNIVERSAL (MISCELLANEOUS) ×6
SET TUBE SMOKE EVAC HIGH FLOW (TUBING) ×3 IMPLANT
SOLUTION ELECTROLUBE (MISCELLANEOUS) ×3 IMPLANT
STAPLER CANNULA SEAL DVNC XI (STAPLE) ×2 IMPLANT
STAPLER CANNULA SEAL XI (STAPLE) ×3
SUT ETHIBOND 0 MO6 C/R (SUTURE) ×6 IMPLANT
SUT MNCRL AB 4-0 PS2 18 (SUTURE) ×3 IMPLANT
SUT STRATAFIX 0 PDS+ CT-2 23 (SUTURE) ×3
SUT V-LOC 90 ABS DVC 3-0 CL (SUTURE) ×6 IMPLANT
SUT VIC AB 3-0 SH 27 (SUTURE) ×3
SUT VIC AB 3-0 SH 27X BRD (SUTURE) ×2 IMPLANT
SUT VICRYL 0 AB UR-6 (SUTURE) ×3 IMPLANT
SUTURE STRATFX 0 PDS+ CT-2 23 (SUTURE) ×2 IMPLANT
SYR 30ML LL (SYRINGE) ×3 IMPLANT
SYSTEM WECK SHIELD CLOSURE (TROCAR) IMPLANT
TRAY FOLEY MTR SLVR 16FR STAT (SET/KITS/TRAYS/PACK) ×6 IMPLANT
TROCAR XCEL NON-BLD 5MMX100MML (ENDOMECHANICALS) IMPLANT

## 2020-08-22 NOTE — Interval H&P Note (Signed)
History and Physical Interval Note:  08/22/2020 7:13 AM  Jonathan Andrews.  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  The various methods of treatment have been discussed with the patient and family. After consideration of risks, benefits and other options for treatment, the patient has consented to  Procedure(s): XI [place] (N/A) as a surgical intervention.  The patient's history has been reviewed, patient examined, no change in status, stable for surgery.  I have reviewed the patient's chart and labs.  Questions were answered to the patient's satisfaction.     Mandy Fitzwater Lysle Pearl

## 2020-08-22 NOTE — Transfer of Care (Signed)
Immediate Anesthesia Transfer of Care Note  Patient: Jonathan Andrews.  Procedure(s) Performed: XI ROBOT ASSISTED LEFT INGUINAL HERNIA REPAIR WITH MESH (Left: Abdomen) HERNIA REPAIR UMBILICAL ADULT WITH MESH  Patient Location: PACU  Anesthesia Type:General  Level of Consciousness: sedated  Airway & Oxygen Therapy: Patient Spontanous Breathing and Patient connected to face mask oxygen  Post-op Assessment: Report given to RN and Post -op Vital signs reviewed and stable  Post vital signs: Reviewed and stable  Last Vitals:  Vitals Value Taken Time  BP 126/65 08/22/20 1029  Temp    Pulse 77 08/22/20 1036  Resp 20 08/22/20 1036  SpO2 100 % 08/22/20 1036  Vitals shown include unvalidated device data.  Last Pain:  Vitals:   08/22/20 0627  TempSrc: Oral  PainSc: 0-No pain         Complications: No notable events documented.

## 2020-08-22 NOTE — Anesthesia Preprocedure Evaluation (Addendum)
Anesthesia Evaluation  Patient identified by MRN, date of birth, ID band Patient awake    Reviewed: Allergy & Precautions, NPO status , Patient's Chart, lab work & pertinent test results  Airway Mallampati: III  TM Distance: >3 FB Neck ROM: full    Dental  (+) Chipped,    Pulmonary former smoker,    Pulmonary exam normal        Cardiovascular Exercise Tolerance: Good hypertension, Pt. on medications Normal cardiovascular exam     Neuro/Psych negative neurological ROS  negative psych ROS   GI/Hepatic negative GI ROS, Neg liver ROS, neg GERD  ,  Endo/Other  negative endocrine ROS  Renal/GU      Musculoskeletal   Abdominal Umbilical hernia  Peds  Hematology negative hematology ROS (+)   Anesthesia Other Findings Past Medical History: No date: Allergy No date: Bladder stones No date: GERD (gastroesophageal reflux disease) No date: Hemorrhoids No date: History of kidney stones No date: Hypertension  Past Surgical History: 08/15/2020: COLONOSCOPY; N/A     Comment:  Procedure: COLONOSCOPY;  Surgeon: Doran Stabler,               MD;  Location: [facility];  Service: Gastroenterology;               Laterality: N/A; 03/27/2019: CYSTOSCOPY WITH LITHOLAPAXY; N/A     Comment:  Procedure: CYSTOSCOPY WITH LITHOLAPAXY;  Surgeon:               Abbie Sons, MD;  Location: [facility];  Service:               Urology;  Laterality: N/A; 04/17/2020: EVALUATION UNDER ANESTHESIA WITH HEMORRHOIDECTOMY; N/A     Comment:  Procedure: HEMORRHOIDECTOMY;  Surgeon: Dwan Bolt,               MD;  Location: [place];  Service: General;  Laterality:               N/A; 08/02/2019: EXTRACORPOREAL SHOCK WAVE LITHOTRIPSY; Left     Comment:  Procedure: EXTRACORPOREAL SHOCK WAVE LITHOTRIPSY (ESWL);              Surgeon: Abbie Sons, MD;  Location: [facility];                Service: Urology;  Laterality: Left; No date: HAND  SURGERY No date: HAND SURGERY; Bilateral 04/17/2020: RECTAL EXAM UNDER ANESTHESIA     Comment:  Procedure: RECTAL EXAM UNDER ANESTHESIA;  Surgeon:               Dwan Bolt, MD;  Location: [place];  Service:               General;; 04/14/2017: TENNIS ELBOW RELEASE/NIRSCHEL PROCEDURE; Bilateral     Comment:  Procedure: TENNIS ELBOW RELEASE/NIRSCHEL PROCEDURE;                Surgeon: Hessie Knows, MD;  Location: [facility];                Service: Orthopedics;  Laterality: Bilateral; 01/03/2018: TENNIS ELBOW RELEASE/NIRSCHEL PROCEDURE; Right     Comment:  Procedure: TENNIS ELBOW RELEASE/NIRSCHEL PROCEDURE;                Surgeon: Hessie Knows, MD;  Location: [facility];                Service: Orthopedics;  Laterality: Right; No date: TONSILLECTOMY  BMI    Body Mass  Index: 28.85 kg/m      Reproductive/Obstetrics negative OB ROS                          Anesthesia Physical Anesthesia Plan  ASA: 2  Anesthesia Plan: General ETT   Post-op Pain Management:    Induction: Intravenous  PONV Risk Score and Plan: Ondansetron, Dexamethasone and Treatment may vary due to age or medical condition  Airway Management Planned: Oral ETT  Additional Equipment:   Intra-op Plan:   Post-operative Plan: Extubation in OR  Informed Consent: I have reviewed the patients History and Physical, chart, labs and discussed the procedure including the risks, benefits and alternatives for the proposed anesthesia with the patient or authorized representative who has indicated his/her understanding and acceptance.     Dental Advisory Given  Plan Discussed with: Anesthesiologist, CRNA and Surgeon  Anesthesia Plan Comments: (Patient consented for risks of anesthesia including but not limited to:  - adverse reactions to medications - damage to eyes, teeth, lips or other oral mucosa - nerve damage due to positioning  - sore throat or hoarseness - Damage to heart, brain, nerves,  lungs, other parts of body or loss of life  Patient voiced understanding.)       Anesthesia Quick Evaluation

## 2020-08-22 NOTE — Discharge Instructions (Addendum)
Hernia repair, Care After This sheet gives you information about how to care for yourself after your procedure. Your health care provider may also give you more specific instructions. If you have problems or questions, contact your health care provider. What can I expect after the procedure? After your procedure, it is common to have the following: Pain in your abdomen, especially in the incision areas. You will be given medicine to control the pain. Tiredness. This is a normal part of the recovery process. Your energy level will return to normal over the next several weeks. Changes in your bowel movements, such as constipation or needing to go more often. Talk with your health care provider about how to manage this. Follow these instructions at home: Medicines  tylenol and advil as needed for discomfort.  Please alternate between the two every four hours as needed for pain.    Use narcotics, if prescribed, only when tylenol and motrin is not enough to control pain.  325-650mg every 8hrs to max of 3000mg/24hrs (including the 325mg in every norco dose) for the tylenol.    Advil up to 800mg per dose every 8hrs as needed for pain.   PLEASE RECORD NUMBER OF PILLS TAKEN UNTIL NEXT FOLLOW UP APPT.  THIS WILL HELP DETERMINE HOW READY YOU ARE TO BE RELEASED FROM ANY ACTIVITY RESTRICTIONS Do not drive or use heavy machinery while taking prescription pain medicine. Do not drink alcohol while taking prescription pain medicine.  Incision care    Follow instructions from your health care provider about how to take care of your incision areas. Make sure you: Keep your incisions clean and dry. Wash your hands with soap and water before and after applying medicine to the areas, and before and after changing your bandage (dressing). If soap and water are not available, use hand sanitizer. Change your dressing as told by your health care provider. Leave stitches (sutures), skin glue, or adhesive strips in  place. These skin closures may need to stay in place for 2 weeks or longer. If adhesive strip edges start to loosen and curl up, you may trim the loose edges. Do not remove adhesive strips completely unless your health care provider tells you to do that. Do not wear tight clothing over the incisions. Tight clothing may rub and irritate the incision areas, which may cause the incisions to open. Do not take baths, swim, or use a hot tub until your health care provider approves. OK TO SHOWER IN 24HRS.   Check your incision area every day for signs of infection. Check for: More redness, swelling, or pain. More fluid or blood. Warmth. Pus or a bad smell. Activity Avoid lifting anything that is heavier than 10 lb (4.5 kg) for 2 weeks or until your health care provider says it is okay. No pushing/pulling greater than 30lbs You may resume normal activities as told by your health care provider. Ask your health care provider what activities are safe for you. Take rest breaks during the day as needed. Eating and drinking Follow instructions from your health care provider about what you can eat after surgery. To prevent or treat constipation while you are taking prescription pain medicine, your health care provider may recommend that you: Drink enough fluid to keep your urine clear or pale yellow. Take over-the-counter or prescription medicines. Eat foods that are high in fiber, such as fresh fruits and vegetables, whole grains, and beans. Limit foods that are high in fat and processed sugars, such as fried and   sweet foods. General instructions Ask your health care provider when you will need an appointment to get your sutures or staples removed. Keep all follow-up visits as told by your health care provider. This is important. Contact a health care provider if: You have more redness, swelling, or pain around your incisions. You have more fluid or blood coming from the incisions. Your incisions feel  warm to the touch. You have pus or a bad smell coming from your incisions or your dressing. You have a fever. You have an incision that breaks open (edges not staying together) after sutures or staples have been removed. Get help right away if: You develop a rash. You have chest pain or difficulty breathing. You have pain or swelling in your legs. You feel light-headed or you faint. Your abdomen swells (becomes distended). You have nausea or vomiting. You have blood in your stool (feces). This information is not intended to replace advice given to you by your health care provider. Make sure you discuss any questions you have with your health care provider. Document Released: 08/14/2004 Document Revised: 10/14/2017 Document Reviewed: 10/27/2015 Elsevier Interactive Patient Education  2019 [place]   The drugs that you were given will stay in your system until tomorrow so for the next 24 hours you should not:  Drive an automobile Make any legal decisions Drink any alcoholic beverage   You may resume regular meals tomorrow.  Today it is better to start with liquids and gradually work up to solid foods.  You may eat anything you prefer, but it is better to start with liquids, then soup and crackers, and gradually work up to solid foods.   Please notify your doctor immediately if you have any unusual bleeding, trouble breathing, redness and pain at the surgery site, drainage, fever, or pain not relieved by medication.    Your post-operative visit with Dr.                                       is: Date:                        Time:    Please call to schedule your post-operative visit.  Additional Instructions:

## 2020-08-22 NOTE — Op Note (Signed)
Preoperative diagnosis: left inguinal Hernia and umbilical hernia, both reducible.  Postoperative diagnosis: same  Procedure: Robotic assisted laparoscopic left reducible inguinal hernia repair with mesh and OPEN umbilical hernia repair with mesh  Anesthesia: General  Surgeon: Dr. Lysle Pearl  Wound Classification: Clean  Specimen: none  Complications: None  Estimated Blood Loss: 62mL   Indications:   See H&P for further details.  Findings: 1. Vas Deferens and cord structures identified and preserved 2. Bard 3D max medium weight mesh used for repair 3. 2.8NO umbilical hernia with thin fascia, fixed with mesh 3. Adequate hemostasis achieved  Description of procedure: The patient was taken to the operating room. A time-out was completed verifying correct patient, procedure, site, positioning, and implant(s) and/or special equipment prior to beginning this procedure.  Area was prepped and draped in the usual sterile fashion. An incision was marked 20 cm above the pubic tubercle, slightly above the umbilicus  Scrotum wrapped in Kerlix roll.  Veress needle inserted at palmer's point.  Saline drop test noted to be positive with gradual increase in pressure after initiation of gas insufflation.  15 mm of pressure was achieved prior to removing the Veress needle and then placing a 8 mm port via the Optiview technique through the umbilical site.  Inspection of the area afterwards noted no injury to the surrounding organs during insertion of the needle and the port.  2 port sites were marked 8 cm to the lateral sides of the initial port, and a 8 mm robotic port was placed on the left side, another 8 mm robotic port on the right side under direct supervision.  Local anesthesia  infused to the preplanned incision sites prior to insertion of the port.  The [place] was then brought into the operative field and docked to the ports.  Examination of the abdominal cavity noted a left inguinal  hernia.  A peritoneal flap was created approximately 8cm cephalad to the defect by using scissors with electrocautery.  Dissection was carried down towards the pubic tubercle, developing the myopectineal orifice view.  Laterally the flap was carried towards the ASIS.  Moderate size hernia sac was noted, which carefully dissected away from the adjacent tissues to be fully reduced out of hernia cavity.  Any bleeding was controlled with combination of electrocautery and manual pressure.    After confirming adequate dissection and the peritoneal reflection completely down and away from the cord structures, a Large Bard 3DMax medium weight mesh was placed within the anterior abdominal wall, secured in place using 2-0 Vicryl on an SH needle immediately above the pubic tubercle.  After noting proper placement of the mesh with the peritoneal reflection deep to it, the previously created peritoneal flap was secured back up to the anterior abdominal wall using running 3-0 V-Lock.  Peritoneal defect near the previous hernia site closed with running 2-0 vicryl. All needles then removed out of the abdominal cavity, Xi platform undocked from the ports and removed off of operative field.  Abdomen then desufflated and ports removed.   The umbilical port site incision was extended.  Dissection carried down to fascia where hernia defect noted.  6.7EH x 2cm umbilical hernia.  The hernia sact and preperitoneal fat contents were dissected off the surrounding structures and reduced.  Umbilical stalk transected to facilitate dissection.  Hemostasis was confirmed prior to reducing the actual contents. Due to size of defect, decision was made to proceed with a mesh repair.    Pre-peritoneal space cleared for mesh  placement.  Fascia and peritoneum noted to be attenuated, but able to maintain appropriate plane. A 8cm umbilical mesh was placed within the preperitoneal cavity through the present defect and secured in place on the  abdominal wall using interrupted 0 Ethibond sutures.  Once the mesh was noted to be laying flat and secured to the abdominal wall the defect itself was primary closed using interrupted 0 Ethibond .  The fascia as well as the skin incision was then infused with exparel. After confirming hemostasis, the umbilical stalk was reattached to the abdominal wall using 2-0 Vicryl and the wound was irrigated and closed in a multilayer fashion, using 3-0 Vicryl for the deep dermal layer in an interrupted fashion and running 4-0 Monocryl in a subcuticular fashion.  Wound and other port sites then dressed with Dermabond.  Patient was then successfully awakened and transferred to PACU in stable condition.  At the end of the procedure sponge and instrument counts were correct.  Testis noted to be proper position

## 2020-08-22 NOTE — Anesthesia Procedure Notes (Signed)
Procedure Name: Intubation Date/Time: 08/22/2020 7:38 AM Performed by: Aline Brochure, CRNA Pre-anesthesia Checklist: Patient identified, Patient being monitored, Timeout performed, Emergency Drugs available and Suction available Patient Re-evaluated:Patient Re-evaluated prior to induction Oxygen Delivery Method: Circle system utilized Preoxygenation: Pre-oxygenation with 100% oxygen Induction Type: IV induction Ventilation: Mask ventilation without difficulty Laryngoscope Size: McGraph and 4 Grade View: Grade I Tube type: Oral Tube size: 7.5 mm Number of attempts: 1 Airway Equipment and Method: Stylet and Video-laryngoscopy Placement Confirmation: ETT inserted through vocal cords under direct vision, positive ETCO2 and breath sounds checked- equal and bilateral Secured at: 23 cm Tube secured with: Tape Dental Injury: Teeth and Oropharynx as per pre-operative assessment  Difficulty Due To: Difficulty was anticipated and Difficult Airway- due to anterior larynx

## 2020-08-22 NOTE — Anesthesia Postprocedure Evaluation (Signed)
Anesthesia Post Note  Patient: Jonathan Andrews.  Procedure(s) Performed: XI ROBOT ASSISTED LEFT INGUINAL HERNIA REPAIR WITH MESH (Left: Abdomen) HERNIA REPAIR UMBILICAL ADULT WITH MESH  Patient location during evaluation: PACU Anesthesia Type: General Level of consciousness: awake Pain management: pain level controlled Vital Signs Assessment: post-procedure vital signs reviewed and stable Respiratory status: spontaneous breathing, nonlabored ventilation, respiratory function stable and patient connected to nasal cannula oxygen Cardiovascular status: blood pressure returned to baseline and stable Postop Assessment: no apparent nausea or vomiting Anesthetic complications: no   No notable events documented.   Last Vitals:  Vitals:   08/22/20 1144 08/22/20 1245  BP: 132/77 (!) 143/90  Pulse: 73 79  Resp: 18 20  Temp: (!) 36.3 C (!) 36 C  SpO2: 99% 100%    Last Pain:  Vitals:   08/22/20 1144  TempSrc: Temporal  PainSc: [name]

## 2020-10-20 ENCOUNTER — Other Ambulatory Visit: Payer: Self-pay

## 2020-10-20 ENCOUNTER — Encounter (HOSPITAL_COMMUNITY): Payer: Self-pay | Admitting: Gastroenterology

## 2020-10-27 ENCOUNTER — Ambulatory Visit (HOSPITAL_COMMUNITY)
Admission: RE | Admit: 2020-10-27 | Discharge: 2020-10-27 | Disposition: A | Discharge status: Home / Self Care | Payer: BC Managed Care – PPO | Source: Ambulatory Visit | Attending: Gastroenterology | Admitting: Gastroenterology

## 2020-10-27 ENCOUNTER — Ambulatory Visit (HOSPITAL_COMMUNITY): Payer: BC Managed Care – PPO | Admitting: Certified Registered"

## 2020-10-27 ENCOUNTER — Encounter (HOSPITAL_COMMUNITY): Payer: Self-pay | Admitting: Gastroenterology

## 2020-10-27 ENCOUNTER — Other Ambulatory Visit: Payer: Self-pay

## 2020-10-27 ENCOUNTER — Encounter (HOSPITAL_COMMUNITY)
Admission: RE | Disposition: A | Discharge status: Home / Self Care | Payer: Self-pay | Source: Ambulatory Visit | Attending: Gastroenterology

## 2020-10-27 DIAGNOSIS — Z885 Allergy status to narcotic agent status: Secondary | ICD-10-CM | POA: Diagnosis not present

## 2020-10-27 DIAGNOSIS — I10 Essential (primary) hypertension: Secondary | ICD-10-CM | POA: Insufficient documentation

## 2020-10-27 DIAGNOSIS — K219 Gastro-esophageal reflux disease without esophagitis: Secondary | ICD-10-CM | POA: Diagnosis not present

## 2020-10-27 DIAGNOSIS — Q438 Other specified congenital malformations of intestine: Secondary | ICD-10-CM | POA: Diagnosis not present

## 2020-10-27 DIAGNOSIS — Z91041 Radiographic dye allergy status: Secondary | ICD-10-CM | POA: Insufficient documentation

## 2020-10-27 DIAGNOSIS — K642 Third degree hemorrhoids: Secondary | ICD-10-CM | POA: Diagnosis not present

## 2020-10-27 DIAGNOSIS — K644 Residual hemorrhoidal skin tags: Secondary | ICD-10-CM | POA: Insufficient documentation

## 2020-10-27 DIAGNOSIS — Z87442 Personal history of urinary calculi: Secondary | ICD-10-CM | POA: Diagnosis not present

## 2020-10-27 DIAGNOSIS — Z87891 Personal history of nicotine dependence: Secondary | ICD-10-CM | POA: Insufficient documentation

## 2020-10-27 DIAGNOSIS — D123 Benign neoplasm of transverse colon: Secondary | ICD-10-CM

## 2020-10-27 DIAGNOSIS — Z86010 Personal history of colonic polyps: Secondary | ICD-10-CM

## 2020-10-27 DIAGNOSIS — Z880 Allergy status to penicillin: Secondary | ICD-10-CM | POA: Diagnosis not present

## 2020-10-27 HISTORY — PX: COLONOSCOPY WITH PROPOFOL: SHX5780

## 2020-10-27 HISTORY — PX: SUBMUCOSAL LIFTING INJECTION: SHX6855

## 2020-10-27 HISTORY — PX: ENDOSCOPIC MUCOSAL RESECTION: SHX6839

## 2020-10-27 HISTORY — PX: HEMOSTASIS CLIP PLACEMENT: SHX6857

## 2020-10-27 SURGERY — COLONOSCOPY WITH PROPOFOL
Anesthesia: Monitor Anesthesia Care

## 2020-10-27 MED ORDER — LACTATED RINGERS IV SOLN: INTRAVENOUS | Status: DC | PRN

## 2020-10-27 MED ORDER — PROPOFOL 10 MG/ML IV BOLUS
INTRAVENOUS | Status: DC | PRN
  Administered 2020-10-27: 10 mg via INTRAVENOUS
  Administered 2020-10-27: 20 mg via INTRAVENOUS

## 2020-10-27 MED ORDER — LACTATED RINGERS IV SOLN
INTRAVENOUS | Status: DC
  Administered 2020-10-27: 1000 mL via INTRAVENOUS

## 2020-10-27 MED ORDER — PROPOFOL 500 MG/50ML IV EMUL
INTRAVENOUS | Status: DC | PRN
  Administered 2020-10-27: 125 ug/kg/min via INTRAVENOUS

## 2020-10-27 MED ORDER — PROPOFOL 1000 MG/100ML IV EMUL
INTRAVENOUS | Status: AC
  Filled 2020-10-27: qty 100

## 2020-10-27 MED ORDER — LIDOCAINE 2% (20 MG/ML) 5 ML SYRINGE
INTRAMUSCULAR | Status: DC | PRN
  Administered 2020-10-27: 80 mg via INTRAVENOUS

## 2020-10-27 MED ORDER — SODIUM CHLORIDE 0.9 % IV SOLN: INTRAVENOUS | Status: DC

## 2020-10-27 SURGICAL SUPPLY — 21 items

## 2020-10-27 NOTE — Discharge Instructions (Signed)
YOU HAD AN ENDOSCOPIC PROCEDURE TODAY: Refer to the procedure report and other information in the discharge instructions given to you for any specific questions about what was found during the examination. If this information does not answer your questions, please call [place] office at [phone] to clarify.   YOU SHOULD EXPECT: Some feelings of bloating in the abdomen. Passage of more gas than usual. Walking can help get rid of the air that was put into your GI tract during the procedure and reduce the bloating. If you had a lower endoscopy (such as a colonoscopy or flexible sigmoidoscopy) you may notice spotting of blood in your stool or on the toilet paper. Some abdominal soreness may be present for a day or two, also.  DIET: Your first meal following the procedure should be a light meal and then it is ok to progress to your normal diet. A half-sandwich or bowl of soup is an example of a good first meal. Heavy or fried foods are harder to digest and may make you feel nauseous or bloated. Drink plenty of fluids but you should avoid alcoholic beverages for 24 hours. If you had a esophageal dilation, please see attached instructions for diet.    ACTIVITY: Your care partner should take you home directly after the procedure. You should plan to take it easy, moving slowly for the rest of the day. You can resume normal activity the day after the procedure however YOU SHOULD NOT DRIVE, use power tools, machinery or perform tasks that involve climbing or major physical exertion for 24 hours (because of the sedation medicines used during the test).   SYMPTOMS TO REPORT IMMEDIATELY: A gastroenterologist can be reached at any hour. Please call [phone]  for any of the following symptoms:  Following lower endoscopy (colonoscopy, flexible sigmoidoscopy) Excessive amounts of blood in the stool  Significant tenderness, worsening of abdominal pains  Swelling of the abdomen that is new, acute  Fever of 100 or  higher   Black, tarry-looking or red, bloody stools  FOLLOW UP:  If any biopsies were taken you will be contacted by phone or by letter within the next 1-3 weeks. Call [phone]  if you have not heard about the biopsies in 3 weeks.  Please also call with any specific questions about appointments or follow up tests.

## 2020-10-27 NOTE — H&P (Signed)
GASTROENTEROLOGY PROCEDURE H&P NOTE   Primary Care Physician: Maryland Pink, MD  HPI: Jonathan Andrews. is a 65 y.o. male who presents for Colonoscopy with EMR attempt of large TC Tubular Adenoma.  Had issues with gaseous distention post colonoscopy in 7/22 that required repeat Colonoscopy with air desufflation.  Past Medical History:  Diagnosis Date   Allergy    Bladder stones    GERD (gastroesophageal reflux disease)    Hemorrhoids    History of kidney stones    Hypertension    Past Surgical History:  Procedure Laterality Date   COLONOSCOPY N/A 08/15/2020   Procedure: COLONOSCOPY;  Surgeon: Doran Stabler, MD;  Location: [facility];  Service: Gastroenterology;  Laterality: N/A;   CYSTOSCOPY WITH LITHOLAPAXY N/A 03/27/2019   Procedure: CYSTOSCOPY WITH LITHOLAPAXY;  Surgeon: Abbie Sons, MD;  Location: [facility];  Service: Urology;  Laterality: N/A;   EVALUATION UNDER ANESTHESIA WITH HEMORRHOIDECTOMY N/A 04/17/2020   Procedure: HEMORRHOIDECTOMY;  Surgeon: Dwan Bolt, MD;  Location: [place];  Service: General;  Laterality: N/A;   EXTRACORPOREAL SHOCK WAVE LITHOTRIPSY Left 08/02/2019   Procedure: EXTRACORPOREAL SHOCK WAVE LITHOTRIPSY (ESWL);  Surgeon: Abbie Sons, MD;  Location: [facility];  Service: Urology;  Laterality: Left;   HAND SURGERY     HAND SURGERY Bilateral    RECTAL EXAM UNDER ANESTHESIA  04/17/2020   Procedure: RECTAL EXAM UNDER ANESTHESIA;  Surgeon: Dwan Bolt, MD;  Location: [place];  Service: General;;   TENNIS ELBOW RELEASE/NIRSCHEL PROCEDURE Bilateral 04/14/2017   Procedure: TENNIS ELBOW RELEASE/NIRSCHEL PROCEDURE;  Surgeon: Hessie Knows, MD;  Location: [facility];  Service: Orthopedics;  Laterality: Bilateral;   TENNIS ELBOW RELEASE/NIRSCHEL PROCEDURE Right 01/03/2018   Procedure: TENNIS ELBOW RELEASE/NIRSCHEL PROCEDURE;  Surgeon: Hessie Knows, MD;  Location: [facility];  Service: Orthopedics;  Laterality: Right;   TONSILLECTOMY     UMBILICAL  HERNIA REPAIR  08/22/2020   Procedure: HERNIA REPAIR UMBILICAL ADULT WITH MESH;  Surgeon: Benjamine Sprague, DO;  Location: [facility];  Service: General;;   Current Facility-Administered Medications  Medication Dose Route Frequency Provider Last Rate Last Admin   0.9 %  sodium chloride infusion   Intravenous Continuous Mansouraty, Telford Nab., MD       lactated ringers infusion   Intravenous Continuous Mansouraty, Telford Nab., MD 10 mL/hr at 10/27/20 1047 1,000 mL at 10/27/20 1047    Current Facility-Administered Medications:    0.9 %  sodium chloride infusion, , Intravenous, Continuous, Mansouraty, Telford Nab., MD   lactated ringers infusion, , Intravenous, Continuous, Mansouraty, Telford Nab., MD, Last Rate: 10 mL/hr at 10/27/20 1047, 1,000 mL at 10/27/20 1047 Allergies  Allergen Reactions   Penicillins Hives    Has patient had a PCN reaction causing immediate rash, facial/tongue/throat swelling, SOB or lightheadedness with hypotension: No Has patient had a PCN reaction causing severe rash involving mucus membranes or skin necrosis: No Has patient had a PCN reaction that required hospitalization: No Has patient had a PCN reaction occurring within the last 10 years: Unknown If all of the above answers are "NO", then may proceed with Cephalosporin use.    Contrast Media [Iodinated Diagnostic Agents] Other (See Comments)    migraine    Hydrocodone-Acetaminophen Itching    No rash   Family History  Problem Relation Age of Onset   Prostate cancer Father    Bladder Cancer Neg Hx    Kidney cancer Neg Hx    Colon cancer Neg Hx    Esophageal  cancer Neg Hx    Inflammatory bowel disease Neg Hx    Liver disease Neg Hx    Rectal cancer Neg Hx    Stomach cancer Neg Hx    Pancreatic cancer Neg Hx    Social History   Socioeconomic History   Marital status: Married    Spouse name: Not on file   Number of children: Not on file   Years of education: Not on file   Highest education level: Not on  file  Occupational History   Not on file  Tobacco Use   Smoking status: Former    Packs/day: 2.00    Years: 12.00    Pack years: 24.00    Types: Cigarettes    Quit date: 03/24/1986    Years since quitting: 34.6   Smokeless tobacco: Never  Vaping Use   Vaping Use: Never used  Substance and Sexual Activity   Alcohol use: Yes    Alcohol/week: 0.0 standard drinks    Comment: rarely   Drug use: No   Sexual activity: Not on file  Other Topics Concern   Not on file  Social History Narrative   Not on file   Social Determinants of Health   Financial Resource Strain: Not on file  Food Insecurity: Not on file  Transportation Needs: Not on file  Physical Activity: Not on file  Stress: Not on file  Social Connections: Not on file  Intimate Partner Violence: Not on file    Physical Exam: Vital signs in last 24 hours: Temp:  [97.8 F (36.6 C)] 97.8 F (36.6 C) (09/19 1045) Pulse Rate:  [61] 61 (09/19 1045) Resp:  [12] 12 (09/19 1045) BP: (155)/(102) 155/102 (09/19 1045) SpO2:  [100 %] 100 % (09/19 1045) Weight:  [106.1 kg] 106.1 kg (09/19 1045)   GEN: NAD EYE: Sclerae anicteric ENT: MMM CV: Non-tachycardic GI: Soft, NT/ND NEURO:  Alert & Oriented x 3  Lab Results: No results for input(s): WBC, HGB, HCT, PLT in the last 72 hours. BMET No results for input(s): NA, K, CL, CO2, GLUCOSE, BUN, CREATININE, CALCIUM in the last 72 hours. LFT No results for input(s): PROT, ALBUMIN, AST, ALT, ALKPHOS, BILITOT, BILIDIR, IBILI in the last 72 hours. PT/INR No results for input(s): LABPROT, INR in the last 72 hours.   Impression / Plan: This is a 65 y.o.male who presents for Colonoscopy with EMR attempt of large TC Tubular Adenoma.  Had issues with gaseous distention post colonoscopy in 7/22 that required repeat Colonoscopy with air desufflation.  Based upon the description and endoscopic pictures I do feel that it is reasonable to pursue an Advanced Polypectomy attempt of the  polyp/lesion.  We discussed some of the techniques of advanced polypectomy which include Endoscopic Mucosal Resection, OVESCO Full-Thickness Resection, Endorotor Morcellation, and Tissue Ablation via Fulguration.  We also reviewed images of typical techniques as noted above.  The risks and benefits of endoscopic evaluation were discussed with the patient; these include but are not limited to the risk of perforation, infection, bleeding, missed lesions, lack of diagnosis, severe illness requiring hospitalization, as well as anesthesia and sedation related illnesses.  During attempts at advanced resection, the risks of bleeding and perforation/leak are increased as opposed to diagnostic and screening procedures, and that was discussed with the patient as well.   In addition, I explained that with the possible need for piecemeal resection, subsequent short-interval endoscopic evaluation for follow up and potential retreatment of the lesion/area may be necessary.  If, after  attempt at removal of the polyp/lesion, it is found that the patient has a complication or that an invasive lesion or malignant lesion is found, or that the polyp/lesion continues to recur, the patient is aware and understands that surgery may still be indicated/required.  All patient questions were answered, to the best of my ability, and the patient agrees to the aforementioned plan of action with follow-up as indicated.   The risks and benefits of endoscopic evaluation/treatment were discussed with the patient and/or family; these include but are not limited to the risk of perforation, infection, bleeding, missed lesions, lack of diagnosis, severe illness requiring hospitalization, as well as anesthesia and sedation related illnesses.  The patient's history has been reviewed, patient examined, no change in status, and deemed stable for procedure.  The patient and/or family is agreeable to proceed.    Justice Britain, MD [facility] Advanced Endoscopy Office # [phone]

## 2020-10-27 NOTE — Op Note (Signed)
[facility] Patient Name: Jonathan Andrews Procedure Date: 10/27/2020 MRN: [id number] Attending MD: Justice Britain , MD Date of Birth: 19-Oct-1955 CSN: [id number] Age: 65 Admit Type: Outpatient Procedure:                Colonoscopy Indications:              Excision of colonic polyp Providers:                Justice Britain, MD, Carmie End, RN, Tyna Jaksch Technician Referring MD:             Robert Bellow, MD, Kathe Becton Lykins Medicines:                Monitored Anesthesia Care Complications:            No immediate complications. Estimated Blood Loss:     Estimated blood loss was minimal. Procedure:                Pre-Anesthesia Assessment:                           - Prior to the procedure, a History and Physical                            was performed, and patient medications and                            allergies were reviewed. The patient's tolerance of                            previous anesthesia was also reviewed. The risks                            and benefits of the procedure and the sedation                            options and risks were discussed with the patient.                            All questions were answered, and informed consent                            was obtained. Prior Anticoagulants: The patient has                            taken no previous anticoagulant or antiplatelet                            agents. ASA Grade Assessment: III - A patient with                            severe systemic disease. After reviewing the risks  and benefits, the patient was deemed in                            satisfactory condition to undergo the procedure.                           After obtaining informed consent, the colonoscope                            was passed under direct vision. Throughout the                            procedure, the patient's blood pressure,  pulse, and                            oxygen saturations were monitored continuously. The                            CF-HQ190L ([id number]) Olympus colonoscope was                            introduced through the anus and advanced to the the                            ileocecal valve. The colonoscopy was performed                            without difficulty. The patient tolerated the                            procedure. The quality of the bowel preparation was                            adequate. The terminal ileum, ileocecal valve,                            appendiceal orifice, and rectum were photographed. Scope In: 11:52:38 AM Scope Out: 12:26:50 PM Scope Withdrawal Time: 0 hours 30 minutes 1 second  Total Procedure Duration: 0 hours 34 minutes 12 seconds  Findings:      The digital rectal exam findings include hemorrhoids. Pertinent       negatives include no palpable rectal lesions.      The colon (entire examined portion) was significantly redundant.      The terminal ileum and ileocecal valve appeared normal.      A 35 mm polyp was found in the mid transverse colon. The polyp was       semi-sessile. Preparations were made for mucosal resection. NBI imaging       and White-light endoscopy was done to demarcate the borders of the       lesion. Orise gel was injected to raise the lesion. Initial attempt at       resection enbloc was felt to be possible, however, a second small piece       of tissue was still present so a piecemeal mucosal resection using a  snare was performed. Resection and retrieval were complete. Fulguration       to ablate the edge of the lesion by snare tip soft cautery was       successful. To prevent bleeding after mucosal resection, six hemostatic       clips were successfully placed (MR conditional). There was no bleeding       during, or at the end, of the procedure.      Normal mucosa was found in the entire colon otherwise.      Non-bleeding  non-thrombosed external and internal hemorrhoids were found       during retroflexion, during perianal exam and during digital exam. The       hemorrhoids were Grade III (internal hemorrhoids that prolapse but       require manual reduction). Impression:               - Hemorrhoids found on digital rectal exam.                           - Redundant colon.                           - The examined portion of the ileum was normal.                           - One 35 mm polyp in the mid transverse colon,                            removed with piecemeal mucosal resection. Resected                            and retrieved. Treated with STSC to margin. Clips                            (MR conditional) were placed.                           - Normal mucosa in the entire examined colon                            otherwise.                           - Non-bleeding non-thrombosed external and internal                            hemorrhoids. Moderate Sedation:      Not Applicable - Patient had care per Anesthesia. Recommendation:           - The patient will be observed post-procedure,                            until all discharge criteria are met.                           - Discharge patient to home.                           -  Patient has a contact number available for                            emergencies. The signs and symptoms of potential                            delayed complications were discussed with the                            patient. Return to normal activities tomorrow.                            Written discharge instructions were provided to the                            patient.                           - High fiber diet.                           - Use FiberCon 1-2 tablets PO daily.                           - Continue present medications.                           - Await pathology results.                           - Repeat colonoscopy in 9-12 months for                             surveillance.                           - The findings and recommendations were discussed                            with the patient.                           - The findings and recommendations were discussed                            with the patient's family. Procedure Code(s):        --- Professional ---                           [phone], Colonoscopy, flexible; with endoscopic                            mucosal resection Diagnosis Code(s):        --- Professional ---                           T70.0, Third degree hemorrhoids  K63.5, Polyp of colon                           Q43.8, Other specified congenital malformations of                            intestine CPT copyright 2019 [employer]. All rights reserved. The codes documented in this report are preliminary and upon coder review may  be revised to meet current compliance requirements. Justice Britain, MD 10/27/2020 12:56:36 PM Number of Addenda: 0

## 2020-10-27 NOTE — Transfer of Care (Signed)
Immediate Anesthesia Transfer of Care Note  Patient: Jonathan Andrews.  Procedure(s) Performed: COLONOSCOPY WITH PROPOFOL ENDOSCOPIC MUCOSAL RESECTION SUBMUCOSAL LIFTING INJECTION  Patient Location: PACU  Anesthesia Type:MAC  Level of Consciousness: awake, alert  and oriented  Airway & Oxygen Therapy: Patient Spontanous Breathing and Patient connected to face mask oxygen  Post-op Assessment: Report given to RN and Post -op Vital signs reviewed and stable  Post vital signs: Reviewed and stable  Last Vitals:  Vitals Value Taken Time  BP 144/84 10/27/20 1242  Temp    Pulse 57 10/27/20 1244  Resp 17 10/27/20 1244  SpO2 100 % 10/27/20 1244  Vitals shown include unvalidated device data.  Last Pain:  Vitals:   10/27/20 1242  TempSrc:   PainSc: 0-No pain         Complications: No notable events documented.

## 2020-10-27 NOTE — Anesthesia Preprocedure Evaluation (Signed)
Anesthesia Evaluation  Patient identified by MRN, date of birth, ID band Patient awake    Reviewed: Allergy & Precautions, NPO status , Patient's Chart, lab work & pertinent test results  Airway Mallampati: II  TM Distance: >3 FB Neck ROM: Full    Dental  (+) Teeth Intact, Dental Advisory Given   Pulmonary former smoker,    Pulmonary exam normal breath sounds clear to auscultation       Cardiovascular hypertension, Pt. on medications Normal cardiovascular exam Rhythm:Regular Rate:Normal     Neuro/Psych negative neurological ROS     GI/Hepatic Neg liver ROS, GERD  ,colon polyp    Endo/Other  negative endocrine ROS  Renal/GU negative Renal ROS     Musculoskeletal negative musculoskeletal ROS (+)   Abdominal   Peds  Hematology negative hematology ROS (+)   Anesthesia Other Findings Day of surgery medications reviewed with the patient.  Reproductive/Obstetrics                             Anesthesia Physical Anesthesia Plan  ASA: 2  Anesthesia Plan: MAC   Post-op Pain Management:    Induction: Intravenous  PONV Risk Score and Plan: 1 and Propofol infusion and Treatment may vary due to age or medical condition  Airway Management Planned: Nasal Cannula and Natural Airway  Additional Equipment:   Intra-op Plan:   Post-operative Plan:   Informed Consent: I have reviewed the patients History and Physical, chart, labs and discussed the procedure including the risks, benefits and alternatives for the proposed anesthesia with the patient or authorized representative who has indicated his/her understanding and acceptance.     Dental advisory given  Plan Discussed with: CRNA and Anesthesiologist  Anesthesia Plan Comments:         Anesthesia Quick Evaluation

## 2020-10-27 NOTE — Anesthesia Postprocedure Evaluation (Signed)
Anesthesia Post Note  Patient: Jonathan Andrews.  Procedure(s) Performed: COLONOSCOPY WITH PROPOFOL ENDOSCOPIC MUCOSAL RESECTION SUBMUCOSAL LIFTING INJECTION     Patient location during evaluation: Endoscopy Anesthesia Type: MAC Level of consciousness: awake and alert Pain management: pain level controlled Vital Signs Assessment: post-procedure vital signs reviewed and stable Respiratory status: spontaneous breathing, nonlabored ventilation, respiratory function stable and patient connected to nasal cannula oxygen Cardiovascular status: stable and blood pressure returned to baseline Postop Assessment: no apparent nausea or vomiting Anesthetic complications: no   No notable events documented.  Last Vitals:  Vitals:   10/27/20 1300 10/27/20 1311  BP: (!) 153/92 (!) 160/84  Pulse: (!) 52 61  Resp: 17 12  Temp:    SpO2: 95% 100%    Last Pain:  Vitals:   10/27/20 1311  TempSrc:   PainSc: 0-No pain                 Catalina Gravel

## 2020-10-28 ENCOUNTER — Encounter: Payer: Self-pay | Admitting: Gastroenterology

## 2020-10-28 ENCOUNTER — Encounter (HOSPITAL_COMMUNITY): Payer: Self-pay | Admitting: Gastroenterology

## 2020-10-28 LAB — SURGICAL PATHOLOGY

## 2020-10-28 NOTE — Progress Notes (Signed)
9 month recall placed per Dr. Donneta Romberg orders

## 2021-03-25 IMAGING — CR DG ABDOMEN 1V
1 series · 2 of 2 positions shown · non-contrast
Comparison: 08/01/2019

CLINICAL DATA: Left ureteral stone post recent lithotripsy

EXAM:
ABDOMEN - 1 VIEW

[Series 1: dg abd 1 view · 0.14mm/px · 2 of 2 slices shown]
[im 1/2]
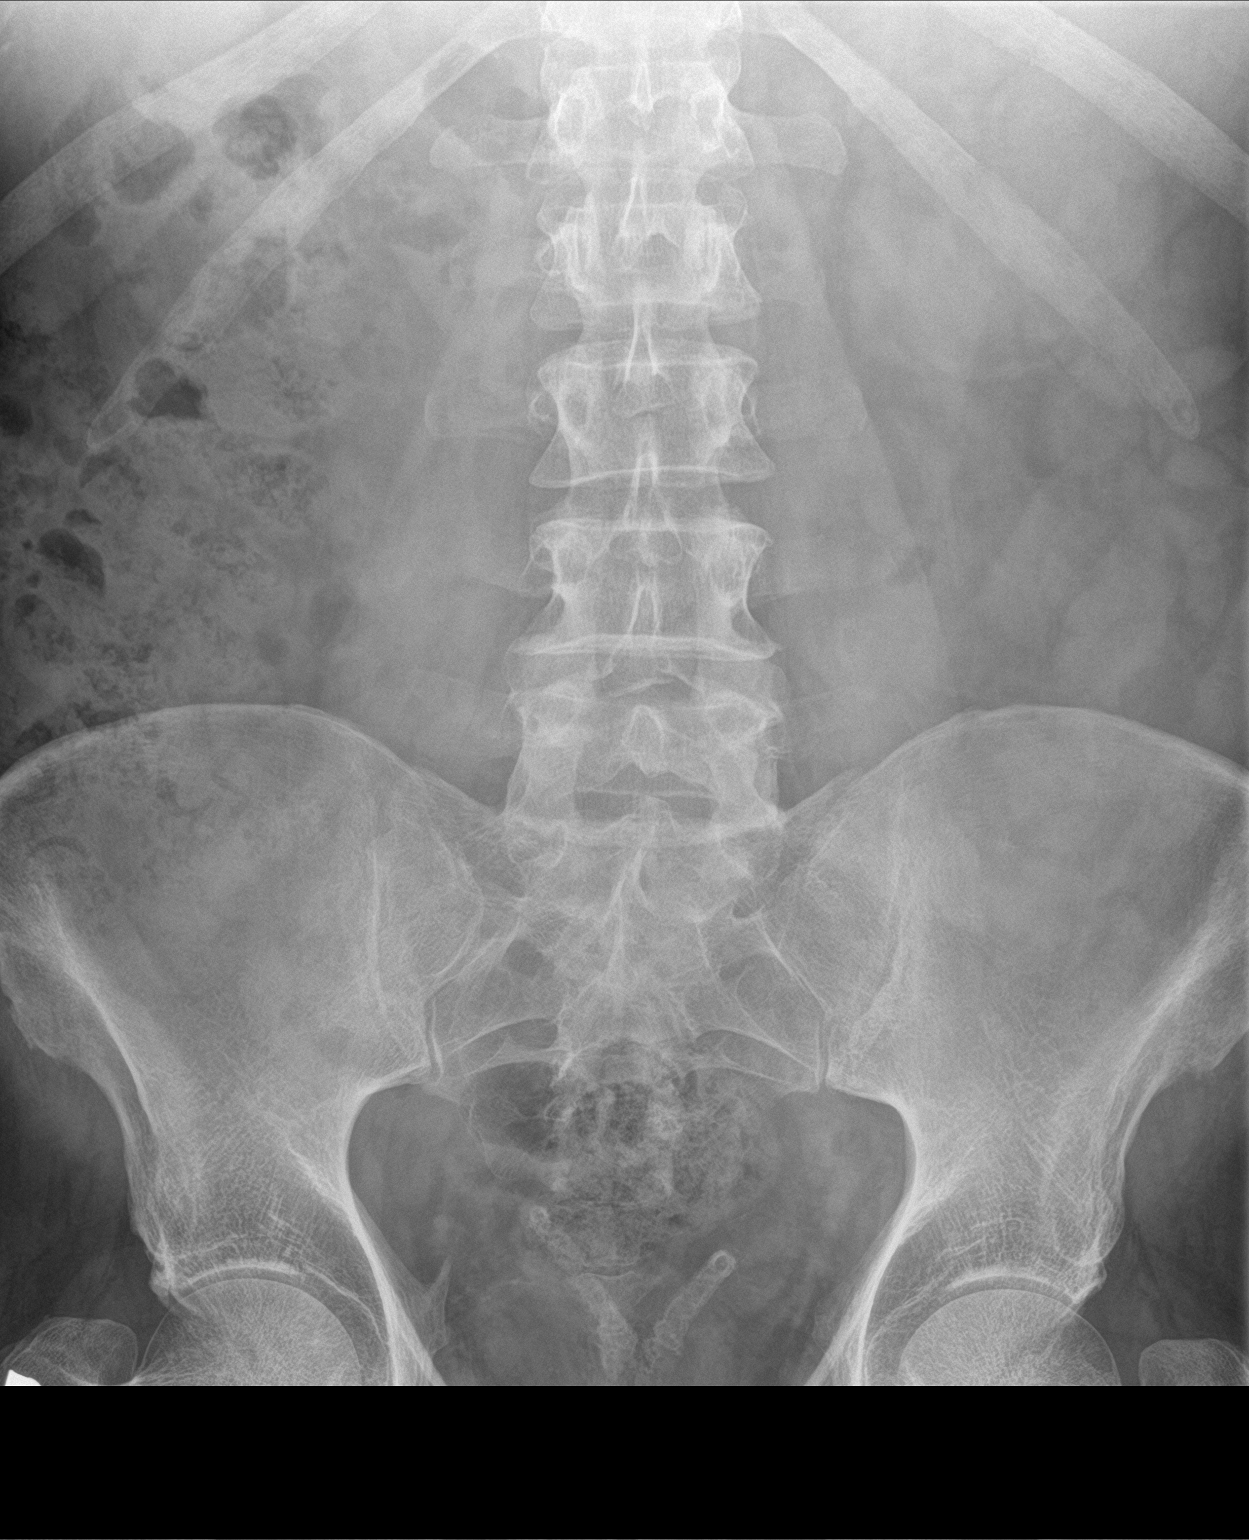
[im 2/2]
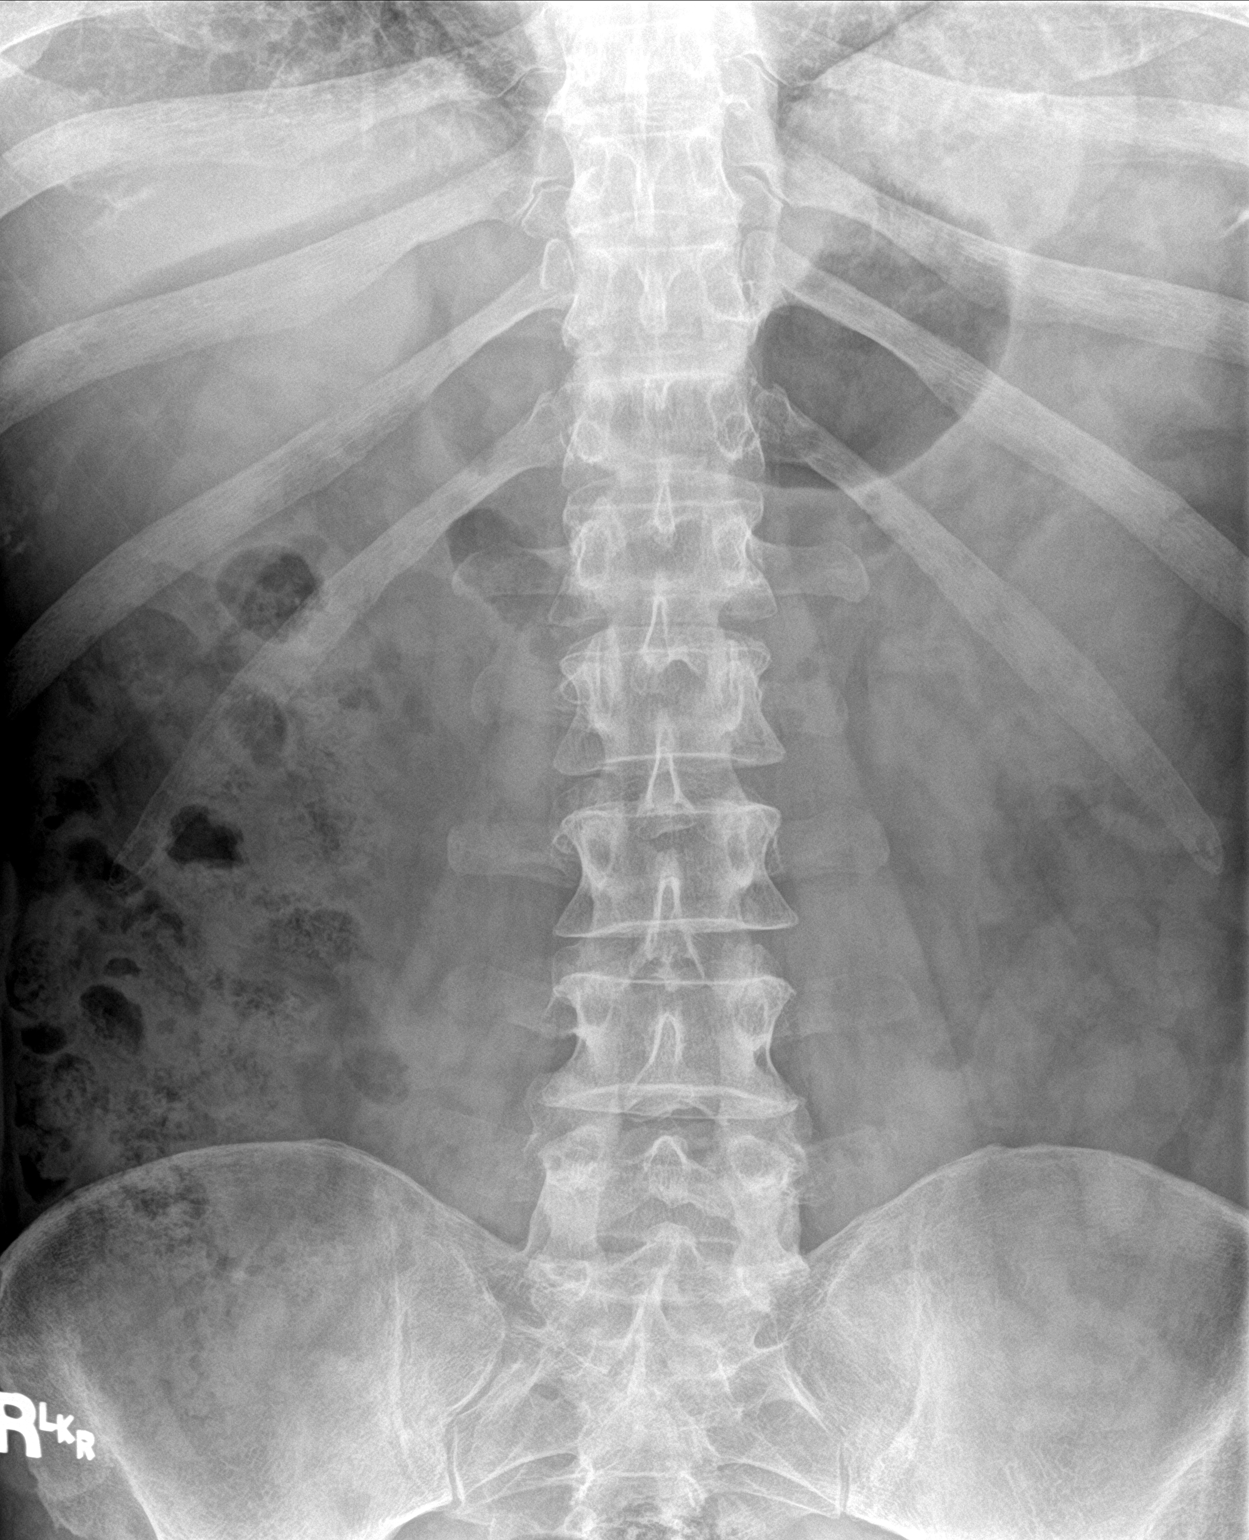

[2 of 2 positions shown; findings below may reference images not displayed]

FINDINGS: The previously identified calculus along the course of the left
ureter is no longer seen. No new calculus identified within
limitation of overlying bowel gas. Unremarkable bowel gas pattern.
No acute osseous abnormality.
IMPRESSION: Previously identified left ureteral calculus is no longer seen.

## 2022-03-18 IMAGING — CT CT ABD-PELV W/O CM
2 of 4 series · 16 of 46 positions shown, 18 images · non-contrast
Comparison: CT abdomen pelvis 02/22/2019

CLINICAL DATA: Abdominal pain, acute (Ped 0-18y) after colonoscopy,
evaluate for perf

EXAM:
CT ABDOMEN AND PELVIS WITHOUT CONTRAST
TECHNIQUE: Multidetector CT imaging of the abdomen and pelvis was performed
following the standard protocol without IV contrast.

[Series 2: axial st · axial · 0.83mm/px · z∈[+1120,+1585]mm · 13 of 105 slices shown, 15 images]
[im 6/105  soft-tissue]
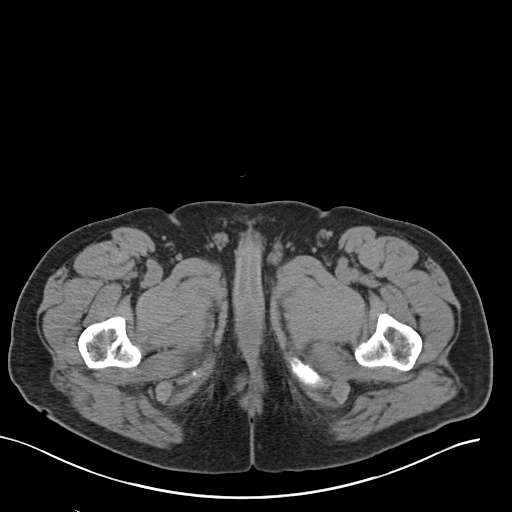
[im 6/105  bone]
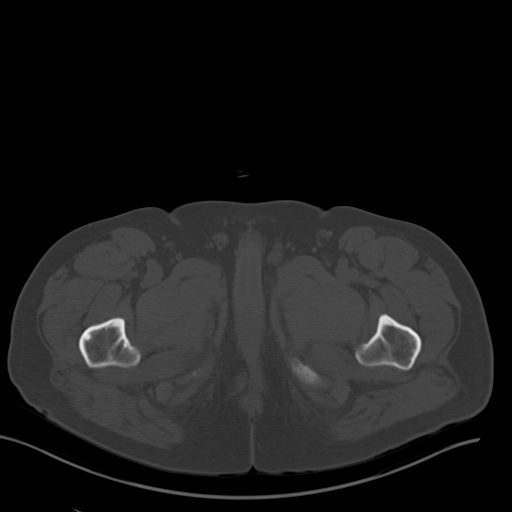
[im 17/105  soft-tissue]
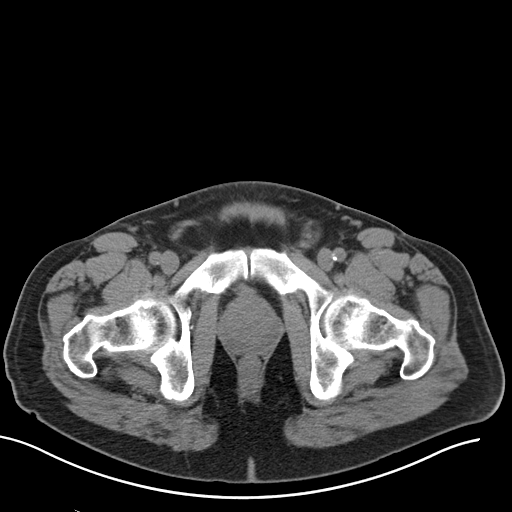
[im 22/105  soft-tissue]
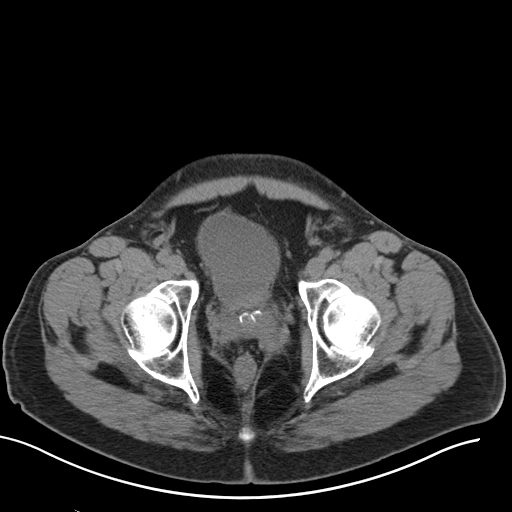
[im 28/105  soft-tissue]
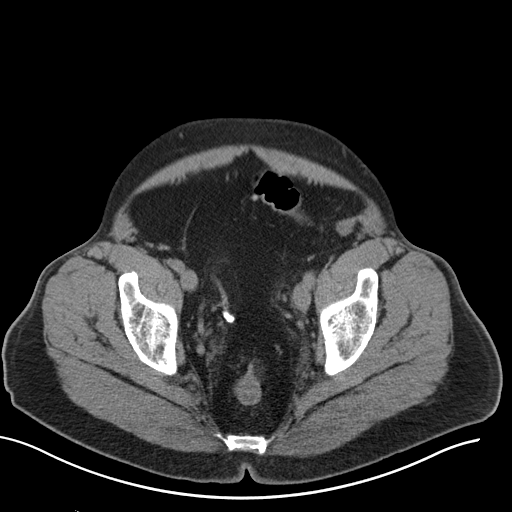
[im 39/105  soft-tissue]
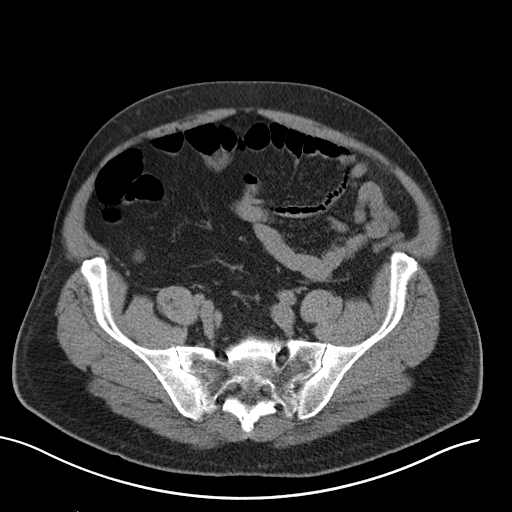
[im 44/105  soft-tissue]
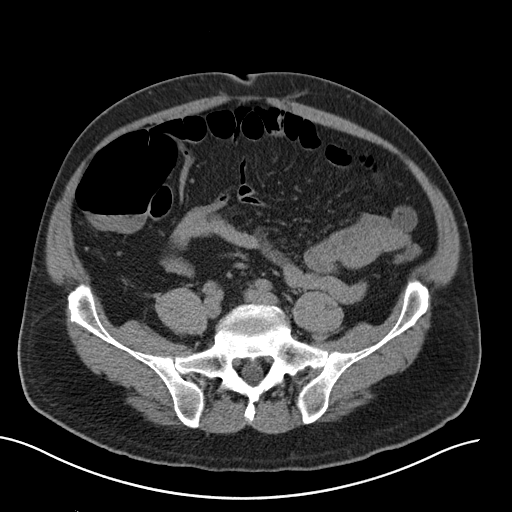
[im 55/105  soft-tissue]
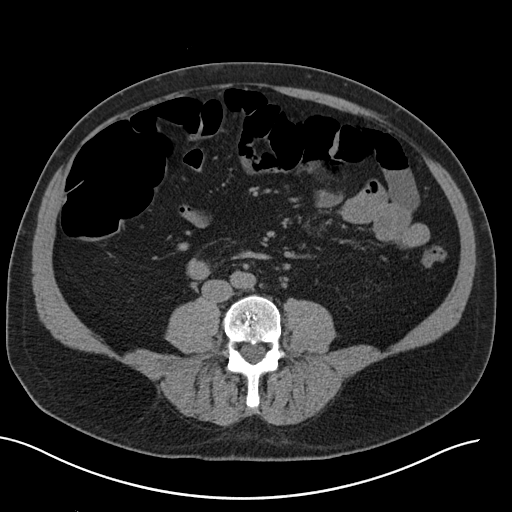
[im 61/105  soft-tissue]
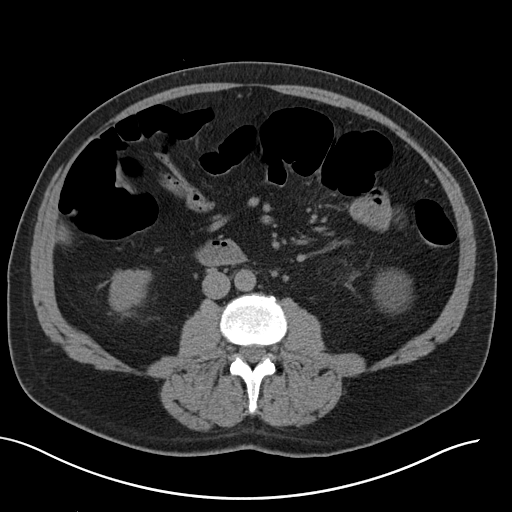
[im 66/105  soft-tissue]
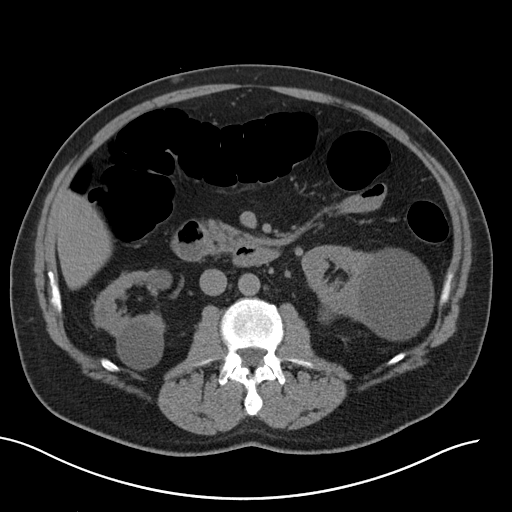
[im 66/105  bone]
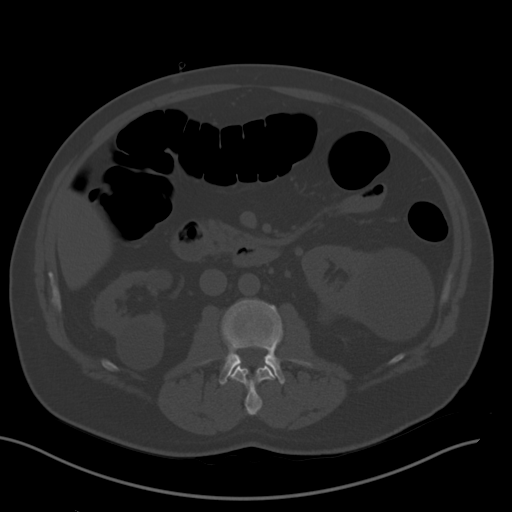
[im 77/105  soft-tissue]
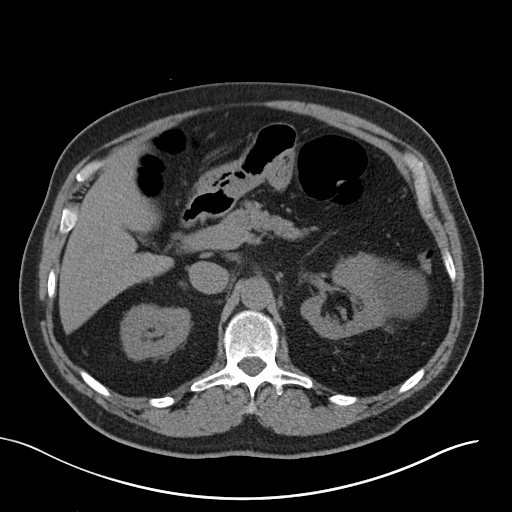
[im 83/105  soft-tissue]
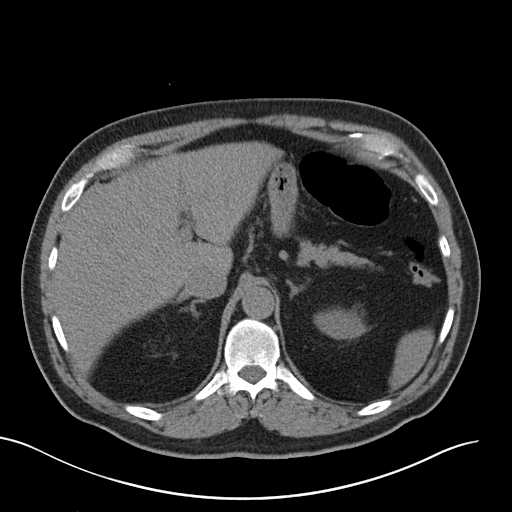
[im 88/105  soft-tissue]
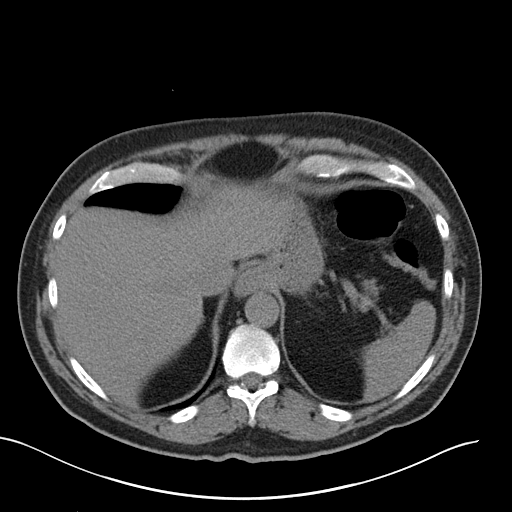
[im 99/105  soft-tissue]
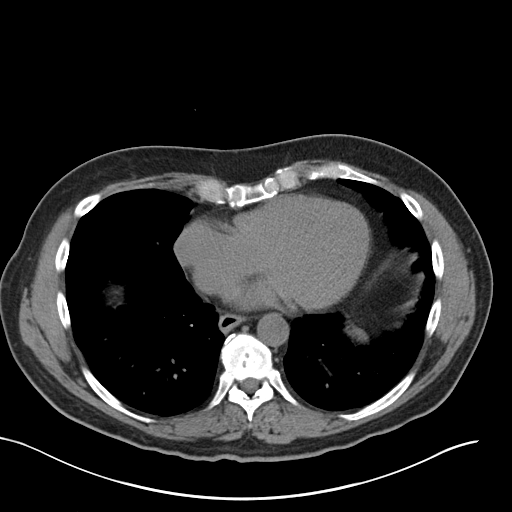

[Series 5: coronal st · coronal · 0.82mm/px · 3 of 177 slices shown]
[im 59/177  soft-tissue]
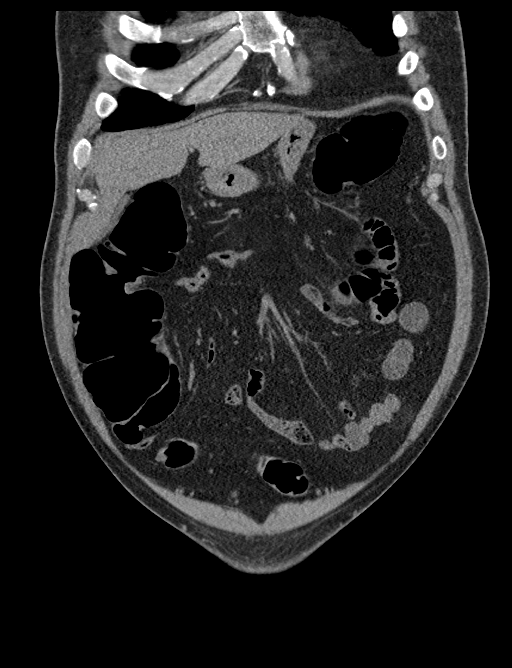
[im 79/177  soft-tissue]
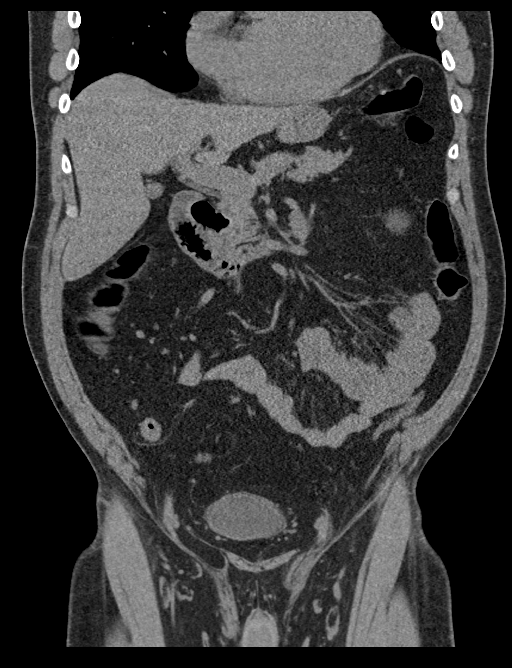
[im 98/177  soft-tissue]
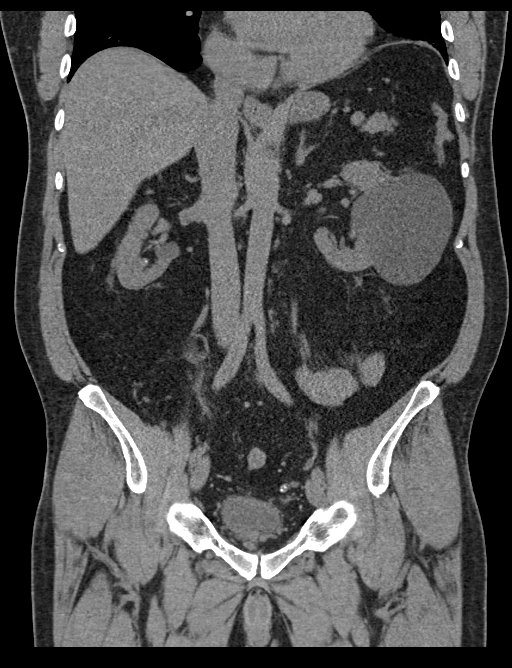

[16 of 46 positions shown; findings below may reference images not displayed]

FINDINGS: Lower chest: Bibasilar hypoventilatory changes. No acute
abnormality.

Hepatobiliary: No focal liver abnormality is seen. No gallstones,
gallbladder wall thickening, or biliary dilatation.

Pancreas: Unremarkable. No pancreatic ductal dilatation or
surrounding inflammatory changes.

Spleen: Normal in size without focal abnormality.

Adrenals/Urinary Tract: Adrenal glands are unremarkable. No
hydronephrosis. Unchanged bilateral renal cysts. No nephrolithiasis.
There is unchanged right renal vascular calcifications or small
renal calculi. Unchanged appearance of the bladder wall.

Stomach/Bowel: The stomach is within normal limits. There is no
evidence of bowel obstruction. The appendix is normal. There is
gaseous distension of the ascending and transverse colon. The
descending and sigmoid colon are decompressed. Scattered colonic
diverticuli without evidence of diverticulitis.

Vascular/Lymphatic: Minimal aortoiliac atherosclerotic
calcifications. No AAA. No lymphadenopathy.

Reproductive: Calcified vas deferens.  The prostate is unremarkable.

Other: Small fat containing inguinal hernias are unchanged. No bowel
containing hernia. There is no abdominopelvic ascites. There is no
free intraperitoneal gas.

Musculoskeletal: No acute osseous abnormality. No suspicious lytic
or blastic lesions.
IMPRESSION: No acute abdominopelvic abnormality.

Gaseous distension of the ascending and transverse colon. No
evidence of free intraperitoneal gas.

Aortic Atherosclerosis ([id number].B).

## 2022-03-24 DIAGNOSIS — I10 Essential (primary) hypertension: Secondary | ICD-10-CM | POA: Diagnosis present

## 2023-01-26 ENCOUNTER — Encounter: Payer: Self-pay | Admitting: Family Medicine

## 2023-03-03 ENCOUNTER — Telehealth: Payer: Self-pay | Admitting: Gastroenterology

## 2023-03-03 NOTE — Telephone Encounter (Signed)
The pt has been scheduled to come in and discuss colon at the hospital. He was last seen 2 years ago.  04/15/23 at 930 am. Pt advised

## 2023-03-03 NOTE — Telephone Encounter (Signed)
Patient called stating he is ready to schedule his colonoscopy done at the hospital, would like a call back to schedule. Thank you

## 2023-03-21 ENCOUNTER — Other Ambulatory Visit: Payer: Self-pay | Admitting: Specialist

## 2023-03-21 DIAGNOSIS — R053 Chronic cough: Secondary | ICD-10-CM

## 2023-04-01 ENCOUNTER — Ambulatory Visit
Admission: RE | Admit: 2023-04-01 | Discharge: 2023-04-01 | Disposition: A | Discharge status: Home / Self Care | Payer: Medicare Other | Source: Ambulatory Visit | Attending: Specialist | Admitting: Specialist

## 2023-04-01 DIAGNOSIS — R053 Chronic cough: Secondary | ICD-10-CM | POA: Diagnosis present

## 2023-04-15 ENCOUNTER — Ambulatory Visit: Payer: BC Managed Care – PPO | Admitting: Gastroenterology

## 2023-05-02 ENCOUNTER — Other Ambulatory Visit (INDEPENDENT_AMBULATORY_CARE_PROVIDER_SITE_OTHER)

## 2023-05-02 ENCOUNTER — Ambulatory Visit (INDEPENDENT_AMBULATORY_CARE_PROVIDER_SITE_OTHER): Admitting: Physician Assistant

## 2023-05-02 ENCOUNTER — Encounter: Payer: Self-pay | Admitting: Physician Assistant

## 2023-05-02 VITALS — BP 150/92 | HR 76 | Ht 76.0 in | Wt 250.0 lb

## 2023-05-02 DIAGNOSIS — Z860101 Personal history of adenomatous and serrated colon polyps: Secondary | ICD-10-CM

## 2023-05-02 DIAGNOSIS — R9389 Abnormal findings on diagnostic imaging of other specified body structures: Secondary | ICD-10-CM

## 2023-05-02 DIAGNOSIS — R053 Chronic cough: Secondary | ICD-10-CM

## 2023-05-02 LAB — CBC WITH DIFFERENTIAL/PLATELET
Basophils Absolute: 0 10*3/uL (ref 0.0–0.1)
Basophils Relative: 0.5 % (ref 0.0–3.0)
Eosinophils Absolute: 0.2 10*3/uL (ref 0.0–0.7)
Eosinophils Relative: 3 % (ref 0.0–5.0)
HCT: 49.1 % (ref 39.0–52.0)
Hemoglobin: 16.6 g/dL (ref 13.0–17.0)
Lymphocytes Relative: 30.5 % (ref 12.0–46.0)
Lymphs Abs: 1.6 10*3/uL (ref 0.7–4.0)
MCHC: 33.8 g/dL (ref 30.0–36.0)
MCV: 100.4 fl — ABNORMAL HIGH (ref 78.0–100.0)
Monocytes Absolute: 0.5 10*3/uL (ref 0.1–1.0)
Monocytes Relative: 9.7 % (ref 3.0–12.0)
Neutro Abs: 2.9 10*3/uL (ref 1.4–7.7)
Neutrophils Relative %: 56.3 % (ref 43.0–77.0)
Platelets: 177 10*3/uL (ref 150.0–400.0)
RBC: 4.89 Mil/uL (ref 4.22–5.81)
RDW: 12.7 % (ref 11.5–15.5)
WBC: 5.2 10*3/uL (ref 4.0–10.5)

## 2023-05-02 LAB — COMPREHENSIVE METABOLIC PANEL
ALT: 33 U/L (ref 0–53)
AST: 24 U/L (ref 0–37)
Albumin: 4.4 g/dL (ref 3.5–5.2)
Alkaline Phosphatase: 54 U/L (ref 39–117)
BUN: 17 mg/dL (ref 6–23)
CO2: 29 meq/L (ref 19–32)
Calcium: 9.9 mg/dL (ref 8.4–10.5)
Chloride: 104 meq/L (ref 96–112)
Creatinine, Ser: 1.16 mg/dL (ref 0.40–1.50)
GFR: 66.01 mL/min (ref >60.00)
Glucose, Bld: 84 mg/dL (ref 70–99)
Potassium: 4.2 meq/L (ref 3.5–5.1)
Sodium: 140 meq/L (ref 135–145)
Total Bilirubin: 0.6 mg/dL (ref 0.2–1.2)
Total Protein: 7.5 g/dL (ref 6.0–8.3)

## 2023-05-02 MED ORDER — DIPHENHYDRAMINE HCL 50 MG PO TABS: 50.0000 mg | ORAL_TABLET | Freq: Once | ORAL | 0 refills | Status: DC

## 2023-05-02 MED ORDER — PREDNISONE 50 MG PO TABS: ORAL_TABLET | ORAL | 0 refills | Status: DC

## 2023-05-02 NOTE — Progress Notes (Signed)
 Chief Complaint: Abnormal CT of the chest  HPI:    [name] is a 68 year old male, known to Dr. Meridee Score, with a past medical history as listed below including reflux, who was referred to me by Jerl Mina, MD for a complaint of abnormal CT of the chest.    08/15/2020 CTAP without contrast done for abdominal pain after colonoscopy with no acute abdominal/pelvic abnormality and gaseous distention of the ascending and transverse colon, no evidence of free intraperitoneal gas.    10/27/2020 colonoscopy for excision of polyp with hemorrhoids on digital rectal exam, redundant colon, 1 35 mm polyp in the mid transverse colon removed piecemeal, nonbleeding nonthrombosed external and internal hemorrhoids.  Repeat colonoscopy recommended in 9 to 12 months.    09/20/2022 CBC normal.  CMP normal.    04/01/2023 CT of the chest without contrast done for a cough showed very unusual partially calcified U-shaped masslike lesion within or along the left side of the upper thoracic esophagus measuring 3.4 x 3.0 x 1.7 cm.  Recommended initial CT chest with contrast to ensure this is not a partially thrombosed anomalous vessel/vascular structure.  Following chest CT with contrast recommended an EGD.  Mildly prominent main pulmonary artery 3.9 cm diameter suggesting pulmonary arterial hypertension.    Today, the patient tells me he has had a chronic cough which they have been working up and in doing so they did a CT of his chest which showed a possible mass.  Tells me he has continued with a cough and a lot of phlegm.  Other than that no other symptoms, no weight loss, change in bowel habits, abdominal pain, heartburn or reflux.  Notes that he was a smoker years ago from [phone].  Has not smoked since then.     Does tell me that he heard he was post have a contrasted CT but he had a reaction he thinks to contrast in the past.  He had a contrasted CT in the morning and later that evening developed a migraine that  even made his teeth hurt.  Apparently went to the hospital and actually had a kidney stone as well.  Uncertain what actually caused what but he is worried about the contrast.    Does discussed that [place] of [place] mid due to June to [place].    Denies fever, chills, weight loss or blood in his stool.  Past Medical History:  Diagnosis Date   Allergy    Bladder stones    GERD (gastroesophageal reflux disease)    Hemorrhoids    History of kidney stones    Hypertension     Past Surgical History:  Procedure Laterality Date   COLONOSCOPY N/A 08/15/2020   Procedure: COLONOSCOPY;  Surgeon: Sherrilyn Rist, MD;  Location: [facility];  Service: Gastroenterology;  Laterality: N/A;   COLONOSCOPY WITH PROPOFOL N/A 10/27/2020   Procedure: COLONOSCOPY WITH PROPOFOL;  Surgeon: Meridee Score Netty Starring., MD;  Location: [facility];  Service: Gastroenterology;  Laterality: N/A;   CYSTOSCOPY WITH LITHOLAPAXY N/A 03/27/2019   Procedure: CYSTOSCOPY WITH LITHOLAPAXY;  Surgeon: Riki Altes, MD;  Location: [facility];  Service: Urology;  Laterality: N/A;   ENDOSCOPIC MUCOSAL RESECTION N/A 10/27/2020   Procedure: ENDOSCOPIC MUCOSAL RESECTION;  Surgeon: Meridee Score Netty Starring., MD;  Location: [facility];  Service: Gastroenterology;  Laterality: N/A;   EVALUATION UNDER ANESTHESIA WITH HEMORRHOIDECTOMY N/A 04/17/2020   Procedure: HEMORRHOIDECTOMY;  Surgeon: Fritzi Mandes, MD;  Location: [facility];  Service:  General;  Laterality: N/A;   EXTRACORPOREAL SHOCK WAVE LITHOTRIPSY Left 08/02/2019   Procedure: EXTRACORPOREAL SHOCK WAVE LITHOTRIPSY (ESWL);  Surgeon: Riki Altes, MD;  Location: [facility];  Service: Urology;  Laterality: Left;   HAND SURGERY     HAND SURGERY Bilateral    HEMOSTASIS CLIP PLACEMENT  10/27/2020   Procedure: HEMOSTASIS CLIP PLACEMENT;  Surgeon: Lemar Lofty., MD;  Location: [facility];  Service: Gastroenterology;;   RECTAL EXAM UNDER ANESTHESIA  04/17/2020   Procedure:  RECTAL EXAM UNDER ANESTHESIA;  Surgeon: Fritzi Mandes, MD;  Location: [facility] OR;  Service: General;;   SUBMUCOSAL LIFTING INJECTION  10/27/2020   Procedure: SUBMUCOSAL LIFTING INJECTION;  Surgeon: Lemar Lofty., MD;  Location: [facility];  Service: Gastroenterology;;   TENNIS ELBOW RELEASE/NIRSCHEL PROCEDURE Bilateral 04/14/2017   Procedure: TENNIS ELBOW RELEASE/NIRSCHEL PROCEDURE;  Surgeon: Kennedy Bucker, MD;  Location: [facility];  Service: Orthopedics;  Laterality: Bilateral;   TENNIS ELBOW RELEASE/NIRSCHEL PROCEDURE Right 01/03/2018   Procedure: TENNIS ELBOW RELEASE/NIRSCHEL PROCEDURE;  Surgeon: Kennedy Bucker, MD;  Location: [facility];  Service: Orthopedics;  Laterality: Right;   TONSILLECTOMY     UMBILICAL HERNIA REPAIR  08/22/2020   Procedure: HERNIA REPAIR UMBILICAL ADULT WITH MESH;  Surgeon: Sung Amabile, DO;  Location: [facility];  Service: General;;    Current Outpatient Medications  Medication Sig Dispense Refill   azelastine (ASTELIN) 0.1 % nasal spray Place 2 sprays into both nostrils daily as needed (vertigo). Use in each nostril as directed     bisacodyl (DULCOLAX) 5 MG EC tablet Take 5 mg by mouth daily.     cetirizine (ZYRTEC) 10 MG tablet Take 10 mg by mouth daily as needed for allergies.     fluticasone (FLONASE) 50 MCG/ACT nasal spray Place 1 spray into both nostrils daily as needed for allergies.     ibuprofen (ADVIL) 800 MG tablet Take 1 tablet (800 mg total) by mouth every 8 (eight) hours as needed for mild pain or moderate pain. (Patient not taking: No sig reported) 30 tablet 0   losartan (COZAAR) 25 MG tablet Take 25 mg by mouth daily.     meclizine (ANTIVERT) 25 MG tablet Take 25 mg by mouth 3 (three) times daily as needed (vertigo).     Multiple Vitamin (MULTIVITAMIN WITH MINERALS) TABS tablet Take 1 tablet by mouth daily. Vita Pak 50 +     ondansetron (ZOFRAN ODT) 4 MG disintegrating tablet Allow 1-2 tablets to dissolve in your mouth every 8 hours as needed for  nausea/vomiting 30 tablet 0   polycarbophil (FIBERCON) 625 MG tablet Take 1 tablet (625 mg total) by mouth daily. 30 tablet 2   polyethylene glycol (MIRALAX / GLYCOLAX) 17 g packet Take 17 g by mouth daily as needed (constipation). 14 each 0   Saw Palmetto, Serenoa repens, (SAW PALMETTO PO) Take 2 capsules by mouth daily.     No current facility-administered medications for this visit.    Allergies as of 05/02/2023 - Review Complete 10/27/2020  Allergen Reaction Noted   Penicillins Hives 06/30/2015   Contrast media [iodinated contrast media] Other (See Comments) 02/12/2019   Hydrocodone-acetaminophen Itching 08/12/2020    Family History  Problem Relation Age of Onset   Prostate cancer Father    Bladder Cancer Neg Hx    Kidney cancer Neg Hx    Colon cancer Neg Hx    Esophageal cancer Neg Hx    Inflammatory bowel disease Neg Hx    Liver disease Neg Hx  Rectal cancer Neg Hx    Stomach cancer Neg Hx    Pancreatic cancer Neg Hx     Social History   Socioeconomic History   Marital status: Married    Spouse name: Not on file   Number of children: Not on file   Years of education: Not on file   Highest education level: Not on file  Occupational History   Not on file  Tobacco Use   Smoking status: Former    Current packs/day: 0.00    Average packs/day: 2.0 packs/day for 12.0 years (24.0 ttl pk-yrs)    Types: Cigarettes    Start date: 03/24/1974    Quit date: 03/24/1986    Years since quitting: 37.1   Smokeless tobacco: Never  Vaping Use   Vaping status: Never Used  Substance and Sexual Activity   Alcohol use: Yes    Alcohol/week: 0.0 standard drinks of alcohol    Comment: rarely   Drug use: No   Sexual activity: Not on file  Other Topics Concern   Not on file  Social History Narrative   Not on file   Social Drivers of Health   Financial Resource Strain: Low Risk  (04/04/2023)   Received from [facility]   Overall Financial Resource Strain  (CARDIA)    Difficulty of Paying Living Expenses: Not hard at all  Food Insecurity: No Food Insecurity (04/04/2023)   Received from [facility]   Hunger Vital Sign    Worried About Running Out of Food in the Last Year: Never true    Ran Out of Food in the Last Year: Never true  Transportation Needs: No Transportation Needs (04/04/2023)   Received from [facility] - Transportation    In the past 12 months, has lack of transportation kept you from medical appointments or from getting medications?: No    Lack of Transportation (Non-Medical): No  Physical Activity: Not on file  Stress: Not on file  Social Connections: Not on file  Intimate Partner Violence: Not on file    Review of Systems:    Constitutional: No weight loss, fever or chills Skin: No rash  Cardiovascular: No chest pain, chest pressure or palpitations   Respiratory: No SOB  Gastrointestinal: See HPI and otherwise negative Genitourinary: No dysuria Neurological: No headache, dizziness or syncope Musculoskeletal: No new muscle or joint pain Hematologic: No bleeding  Psychiatric: No history of depression or anxiety   Physical Exam:  Vital signs: BP (!) 150/92   Pulse 76   Ht 6\' 4"  (1.93 m)   Wt 250 lb (113.4 kg)   BMI 30.43 kg/m    Constitutional:   Pleasant Caucasian male appears to be in NAD, Well developed, Well nourished, alert and cooperative Head:  Normocephalic and atraumatic. Eyes:   PEERL, EOMI. No icterus. Conjunctiva pink. Ears:  Normal auditory acuity. Neck:  Supple Throat: Oral cavity and pharynx without inflammation, swelling or lesion.  Respiratory: Respirations even and unlabored. Lungs clear to auscultation bilaterally.   No wheezes, crackles, or rhonchi.  Cardiovascular: Normal S1, S2. No MRG. Regular rate and rhythm. No peripheral edema, cyanosis or pallor.  Gastrointestinal:  Soft, nondistended, nontender. No rebound or guarding. Normal bowel  sounds. No appreciable masses or hepatomegaly. Rectal:  Not performed.  Msk:  Symmetrical without gross deformities. Without edema, no deformity or joint abnormality.  Neurologic:  Alert and  oriented x4;  grossly normal neurologically.  Skin:   Dry and  intact without significant lesions or rashes. Psychiatric: Demonstrates good judgement and reason without abnormal affect or behaviors.  RELEVANT LABS AND IMAGING: CBC    Component Value Date/Time   WBC 4.9 08/15/2020 1313   RBC 4.72 08/15/2020 1313   HGB 15.4 08/15/2020 1313   HGB 15.2 06/10/2012 2038   HCT 47.1 08/15/2020 1313   HCT 44.0 06/10/2012 2038   PLT 162 08/15/2020 1313   PLT 202 06/10/2012 2038   MCV 99.8 08/15/2020 1313   MCV 96 06/10/2012 2038   MCH 32.6 08/15/2020 1313   MCHC 32.7 08/15/2020 1313   RDW 12.5 08/15/2020 1313   RDW 12.5 06/10/2012 2038   LYMPHSABS 0.9 08/15/2020 1313   MONOABS 0.4 08/15/2020 1313   EOSABS 0.0 08/15/2020 1313   BASOSABS 0.0 08/15/2020 1313    CMP     Component Value Date/Time   NA 138 08/15/2020 1313   NA 139 06/10/2012 2038   K 3.9 08/15/2020 1313   K 4.0 06/10/2012 2038   CL 109 08/15/2020 1313   CL 106 06/10/2012 2038   CO2 22 08/15/2020 1313   CO2 27 06/10/2012 2038   GLUCOSE 105 (H) 08/15/2020 1313   GLUCOSE 112 (H) 06/10/2012 2038   BUN 11 08/15/2020 1313   BUN 20 (H) 06/10/2012 2038   CREATININE 1.14 08/15/2020 1313   CREATININE 1.55 (H) 06/10/2012 2038   CALCIUM 8.7 (L) 08/15/2020 1313   CALCIUM 9.8 06/10/2012 2038   PROT 7.2 08/15/2020 1313   ALBUMIN 4.2 08/15/2020 1313   AST 28 08/15/2020 1313   ALT 34 08/15/2020 1313   ALKPHOS 55 08/15/2020 1313   BILITOT 0.7 08/15/2020 1313   GFRNONAA >60 08/15/2020 1313   GFRNONAA 50 (L) 06/10/2012 2038   GFRAA >60 07/31/2019 2324   GFRAA 58 (L) 06/10/2012 2038    Assessment: 1.  Abnormal CT of the chest: With a U-shaped masslike area, they recommended CT with contrast of the chest to make sure this was not  vascular, will order this first, does have history of smoking; consider mass versus other abnormality 2.  History of adenomatous polyps: 35 mm polyp removed in 2022, repeat colonoscopy recommended in 9 to 12 months, patient never followed up, he is overdue now  Plan: 1.  Scheduled patient for CT of the chest with contrast.  Patient will have premed with Benadryl per our protocol.  This was ordered stat. 2.  Pending results from above patient will need to be scheduled for emergent/urgent EGD +/- colonoscopy with Dr. Meridee Score.  Will go ahead and reach out to him to let him know. 3.  Discussed with patient that he is due a colonoscopy/overdue a colonoscopy given large adenomatous polyp a couple of years ago, his EGD is certainly more pressing at this moment, we will try to put them together but if we cannot they may need to be scheduled separately. 4.  Patient to follow in clinic per recommendations after CT above.  Hyacinth Meeker, PA-C [facility] 05/02/2023, 10:16 AM  Cc: Jerl Mina, MD

## 2023-05-02 NOTE — Addendum Note (Signed)
 Addended by: Marlowe Kays on: 05/02/2023 11:14 AM   Modules accepted: Orders

## 2023-05-02 NOTE — H&P (View-Only) (Signed)
 Chief Complaint: Abnormal CT of the chest  HPI:    [name] is a 68 year old male, known to Dr. Meridee Score, with a past medical history as listed below including reflux, who was referred to me by Jerl Mina, MD for a complaint of abnormal CT of the chest.    08/15/2020 CTAP without contrast done for abdominal pain after colonoscopy with no acute abdominal/pelvic abnormality and gaseous distention of the ascending and transverse colon, no evidence of free intraperitoneal gas.    10/27/2020 colonoscopy for excision of polyp with hemorrhoids on digital rectal exam, redundant colon, 1 35 mm polyp in the mid transverse colon removed piecemeal, nonbleeding nonthrombosed external and internal hemorrhoids.  Repeat colonoscopy recommended in 9 to 12 months.    09/20/2022 CBC normal.  CMP normal.    04/01/2023 CT of the chest without contrast done for a cough showed very unusual partially calcified U-shaped masslike lesion within or along the left side of the upper thoracic esophagus measuring 3.4 x 3.0 x 1.7 cm.  Recommended initial CT chest with contrast to ensure this is not a partially thrombosed anomalous vessel/vascular structure.  Following chest CT with contrast recommended an EGD.  Mildly prominent main pulmonary artery 3.9 cm diameter suggesting pulmonary arterial hypertension.    Today, the patient tells me he has had a chronic cough which they have been working up and in doing so they did a CT of his chest which showed a possible mass.  Tells me he has continued with a cough and a lot of phlegm.  Other than that no other symptoms, no weight loss, change in bowel habits, abdominal pain, heartburn or reflux.  Notes that he was a smoker years ago from [phone].  Has not smoked since then.     Does tell me that he heard he was post have a contrasted CT but he had a reaction he thinks to contrast in the past.  He had a contrasted CT in the morning and later that evening developed a migraine that  even made his teeth hurt.  Apparently went to the hospital and actually had a kidney stone as well.  Uncertain what actually caused what but he is worried about the contrast.    Does discussed that [place] mid due to June to [place].    Denies fever, chills, weight loss or blood in his stool.  Past Medical History:  Diagnosis Date   Allergy    Bladder stones    GERD (gastroesophageal reflux disease)    Hemorrhoids    History of kidney stones    Hypertension     Past Surgical History:  Procedure Laterality Date   COLONOSCOPY N/A 08/15/2020   Procedure: COLONOSCOPY;  Surgeon: Sherrilyn Rist, MD;  Location: [facility];  Service: Gastroenterology;  Laterality: N/A;   COLONOSCOPY WITH PROPOFOL N/A 10/27/2020   Procedure: COLONOSCOPY WITH PROPOFOL;  Surgeon: Meridee Score Netty Starring., MD;  Location: [facility];  Service: Gastroenterology;  Laterality: N/A;   CYSTOSCOPY WITH LITHOLAPAXY N/A 03/27/2019   Procedure: CYSTOSCOPY WITH LITHOLAPAXY;  Surgeon: Riki Altes, MD;  Location: [facility];  Service: Urology;  Laterality: N/A;   ENDOSCOPIC MUCOSAL RESECTION N/A 10/27/2020   Procedure: ENDOSCOPIC MUCOSAL RESECTION;  Surgeon: Meridee Score Netty Starring., MD;  Location: [facility];  Service: Gastroenterology;  Laterality: N/A;   EVALUATION UNDER ANESTHESIA WITH HEMORRHOIDECTOMY N/A 04/17/2020   Procedure: HEMORRHOIDECTOMY;  Surgeon: Fritzi Mandes, MD;  Location: [facility] OR;  Service:  General;  Laterality: N/A;   EXTRACORPOREAL SHOCK WAVE LITHOTRIPSY Left 08/02/2019   Procedure: EXTRACORPOREAL SHOCK WAVE LITHOTRIPSY (ESWL);  Surgeon: Riki Altes, MD;  Location: [facility];  Service: Urology;  Laterality: Left;   HAND SURGERY     HAND SURGERY Bilateral    HEMOSTASIS CLIP PLACEMENT  10/27/2020   Procedure: HEMOSTASIS CLIP PLACEMENT;  Surgeon: Lemar Lofty., MD;  Location: [facility];  Service: Gastroenterology;;   RECTAL EXAM UNDER ANESTHESIA  04/17/2020   Procedure:  RECTAL EXAM UNDER ANESTHESIA;  Surgeon: Fritzi Mandes, MD;  Location: [facility] OR;  Service: General;;   SUBMUCOSAL LIFTING INJECTION  10/27/2020   Procedure: SUBMUCOSAL LIFTING INJECTION;  Surgeon: Lemar Lofty., MD;  Location: [facility];  Service: Gastroenterology;;   TENNIS ELBOW RELEASE/NIRSCHEL PROCEDURE Bilateral 04/14/2017   Procedure: TENNIS ELBOW RELEASE/NIRSCHEL PROCEDURE;  Surgeon: Kennedy Bucker, MD;  Location: [facility];  Service: Orthopedics;  Laterality: Bilateral;   TENNIS ELBOW RELEASE/NIRSCHEL PROCEDURE Right 01/03/2018   Procedure: TENNIS ELBOW RELEASE/NIRSCHEL PROCEDURE;  Surgeon: Kennedy Bucker, MD;  Location: [facility];  Service: Orthopedics;  Laterality: Right;   TONSILLECTOMY     UMBILICAL HERNIA REPAIR  08/22/2020   Procedure: HERNIA REPAIR UMBILICAL ADULT WITH MESH;  Surgeon: Sung Amabile, DO;  Location: [facility];  Service: General;;    Current Outpatient Medications  Medication Sig Dispense Refill   azelastine (ASTELIN) 0.1 % nasal spray Place 2 sprays into both nostrils daily as needed (vertigo). Use in each nostril as directed     bisacodyl (DULCOLAX) 5 MG EC tablet Take 5 mg by mouth daily.     cetirizine (ZYRTEC) 10 MG tablet Take 10 mg by mouth daily as needed for allergies.     fluticasone (FLONASE) 50 MCG/ACT nasal spray Place 1 spray into both nostrils daily as needed for allergies.     ibuprofen (ADVIL) 800 MG tablet Take 1 tablet (800 mg total) by mouth every 8 (eight) hours as needed for mild pain or moderate pain. (Patient not taking: No sig reported) 30 tablet 0   losartan (COZAAR) 25 MG tablet Take 25 mg by mouth daily.     meclizine (ANTIVERT) 25 MG tablet Take 25 mg by mouth 3 (three) times daily as needed (vertigo).     Multiple Vitamin (MULTIVITAMIN WITH MINERALS) TABS tablet Take 1 tablet by mouth daily. Vita Pak 50 +     ondansetron (ZOFRAN ODT) 4 MG disintegrating tablet Allow 1-2 tablets to dissolve in your mouth every 8 hours as needed for  nausea/vomiting 30 tablet 0   polycarbophil (FIBERCON) 625 MG tablet Take 1 tablet (625 mg total) by mouth daily. 30 tablet 2   polyethylene glycol (MIRALAX / GLYCOLAX) 17 g packet Take 17 g by mouth daily as needed (constipation). 14 each 0   Saw Palmetto, Serenoa repens, (SAW PALMETTO PO) Take 2 capsules by mouth daily.     No current facility-administered medications for this visit.    Allergies as of 05/02/2023 - Review Complete 10/27/2020  Allergen Reaction Noted   Penicillins Hives 06/30/2015   Contrast media [iodinated contrast media] Other (See Comments) 02/12/2019   Hydrocodone-acetaminophen Itching 08/12/2020    Family History  Problem Relation Age of Onset   Prostate cancer Father    Bladder Cancer Neg Hx    Kidney cancer Neg Hx    Colon cancer Neg Hx    Esophageal cancer Neg Hx    Inflammatory bowel disease Neg Hx    Liver disease Neg Hx  Rectal cancer Neg Hx    Stomach cancer Neg Hx    Pancreatic cancer Neg Hx     Social History   Socioeconomic History   Marital status: Married    Spouse name: Not on file   Number of children: Not on file   Years of education: Not on file   Highest education level: Not on file  Occupational History   Not on file  Tobacco Use   Smoking status: Former    Current packs/day: 0.00    Average packs/day: 2.0 packs/day for 12.0 years (24.0 ttl pk-yrs)    Types: Cigarettes    Start date: 03/24/1974    Quit date: 03/24/1986    Years since quitting: 37.1   Smokeless tobacco: Never  Vaping Use   Vaping status: Never Used  Substance and Sexual Activity   Alcohol use: Yes    Alcohol/week: 0.0 standard drinks of alcohol    Comment: rarely   Drug use: No   Sexual activity: Not on file  Other Topics Concern   Not on file  Social History Narrative   Not on file   Social Drivers of Health   Financial Resource Strain: Low Risk  (04/04/2023)   Received from [facility]   Overall Financial Resource Strain  (CARDIA)    Difficulty of Paying Living Expenses: Not hard at all  Food Insecurity: No Food Insecurity (04/04/2023)   Received from [facility]   Hunger Vital Sign    Worried About Running Out of Food in the Last Year: Never true    Ran Out of Food in the Last Year: Never true  Transportation Needs: No Transportation Needs (04/04/2023)   Received from [facility] - Transportation    In the past 12 months, has lack of transportation kept you from medical appointments or from getting medications?: No    Lack of Transportation (Non-Medical): No  Physical Activity: Not on file  Stress: Not on file  Social Connections: Not on file  Intimate Partner Violence: Not on file    Review of Systems:    Constitutional: No weight loss, fever or chills Skin: No rash  Cardiovascular: No chest pain, chest pressure or palpitations   Respiratory: No SOB  Gastrointestinal: See HPI and otherwise negative Genitourinary: No dysuria Neurological: No headache, dizziness or syncope Musculoskeletal: No new muscle or joint pain Hematologic: No bleeding  Psychiatric: No history of depression or anxiety   Physical Exam:  Vital signs: BP (!) 150/92   Pulse 76   Ht 6\' 4"  (1.93 m)   Wt 250 lb (113.4 kg)   BMI 30.43 kg/m    Constitutional:   Pleasant Caucasian male appears to be in NAD, Well developed, Well nourished, alert and cooperative Head:  Normocephalic and atraumatic. Eyes:   PEERL, EOMI. No icterus. Conjunctiva pink. Ears:  Normal auditory acuity. Neck:  Supple Throat: Oral cavity and pharynx without inflammation, swelling or lesion.  Respiratory: Respirations even and unlabored. Lungs clear to auscultation bilaterally.   No wheezes, crackles, or rhonchi.  Cardiovascular: Normal S1, S2. No MRG. Regular rate and rhythm. No peripheral edema, cyanosis or pallor.  Gastrointestinal:  Soft, nondistended, nontender. No rebound or guarding. Normal bowel  sounds. No appreciable masses or hepatomegaly. Rectal:  Not performed.  Msk:  Symmetrical without gross deformities. Without edema, no deformity or joint abnormality.  Neurologic:  Alert and  oriented x4;  grossly normal neurologically.  Skin:   Dry and  intact without significant lesions or rashes. Psychiatric: Demonstrates good judgement and reason without abnormal affect or behaviors.  RELEVANT LABS AND IMAGING: CBC    Component Value Date/Time   WBC 4.9 08/15/2020 1313   RBC 4.72 08/15/2020 1313   HGB 15.4 08/15/2020 1313   HGB 15.2 06/10/2012 2038   HCT 47.1 08/15/2020 1313   HCT 44.0 06/10/2012 2038   PLT 162 08/15/2020 1313   PLT 202 06/10/2012 2038   MCV 99.8 08/15/2020 1313   MCV 96 06/10/2012 2038   MCH 32.6 08/15/2020 1313   MCHC 32.7 08/15/2020 1313   RDW 12.5 08/15/2020 1313   RDW 12.5 06/10/2012 2038   LYMPHSABS 0.9 08/15/2020 1313   MONOABS 0.4 08/15/2020 1313   EOSABS 0.0 08/15/2020 1313   BASOSABS 0.0 08/15/2020 1313    CMP     Component Value Date/Time   NA 138 08/15/2020 1313   NA 139 06/10/2012 2038   K 3.9 08/15/2020 1313   K 4.0 06/10/2012 2038   CL 109 08/15/2020 1313   CL 106 06/10/2012 2038   CO2 22 08/15/2020 1313   CO2 27 06/10/2012 2038   GLUCOSE 105 (H) 08/15/2020 1313   GLUCOSE 112 (H) 06/10/2012 2038   BUN 11 08/15/2020 1313   BUN 20 (H) 06/10/2012 2038   CREATININE 1.14 08/15/2020 1313   CREATININE 1.55 (H) 06/10/2012 2038   CALCIUM 8.7 (L) 08/15/2020 1313   CALCIUM 9.8 06/10/2012 2038   PROT 7.2 08/15/2020 1313   ALBUMIN 4.2 08/15/2020 1313   AST 28 08/15/2020 1313   ALT 34 08/15/2020 1313   ALKPHOS 55 08/15/2020 1313   BILITOT 0.7 08/15/2020 1313   GFRNONAA >60 08/15/2020 1313   GFRNONAA 50 (L) 06/10/2012 2038   GFRAA >60 07/31/2019 2324   GFRAA 58 (L) 06/10/2012 2038    Assessment: 1.  Abnormal CT of the chest: With a U-shaped masslike area, they recommended CT with contrast of the chest to make sure this was not  vascular, will order this first, does have history of smoking; consider mass versus other abnormality 2.  History of adenomatous polyps: 35 mm polyp removed in 2022, repeat colonoscopy recommended in 9 to 12 months, patient never followed up, he is overdue now  Plan: 1.  Scheduled patient for CT of the chest with contrast.  Patient will have premed with Benadryl per our protocol.  This was ordered stat. 2.  Pending results from above patient will need to be scheduled for emergent/urgent EGD +/- colonoscopy with Dr. Meridee Score.  Will go ahead and reach out to him to let him know. 3.  Discussed with patient that he is due a colonoscopy/overdue a colonoscopy given large adenomatous polyp a couple of years ago, his EGD is certainly more pressing at this moment, we will try to put them together but if we cannot they may need to be scheduled separately. 4.  Patient to follow in clinic per recommendations after CT above.  Hyacinth Meeker, PA-C [facility] 05/02/2023, 10:16 AM  Cc: Jerl Mina, MD

## 2023-05-02 NOTE — Progress Notes (Signed)
 Attending Physician's Attestation   I have reviewed the chart.   I agree with the Advanced Practitioner's note, impression, and recommendations with any updates as below. Agree with expedited CT chest with contrast to hopefully better define the esophagus versus extra esophageal region. Expedited EGD certainly makes sense as well if there is a significant abnormality there. If no significant abnormality, then we can try to do EGD and colonoscopy together but I would do them together in the hospital-based outpatient setting due to the type of polyp that we removed years ago in case he were to have any evidence of recurrence that we would then need to be able to work on it more aggressively in the hospital-based outpatient setting.  I will await the findings of the CT chest and then we can determine timing of the EGD +/- colonoscopy.   Corliss Parish, MD  Gastroenterology Advanced Endoscopy Office # [phone]

## 2023-05-02 NOTE — Patient Instructions (Addendum)
 Your provider has requested that you go to the basement level for lab work before leaving today. Press "B" on the elevator. The lab is located at the first door on the left as you exit the elevator.  Your records indicate that you have an allergy/sensitivity to one or more components within IV contrast dye. We have sent a prescription of Prednisone 50 mg (3 tablets) and Benadryl 50 mg (1 tablet) to your pharmacy as a pre-mediation for your contrasted procedure which should prevent any reaction from occurring.  Take (1) 50 mg tablet of prednisone 13 hours prior to your procedure at 4 am.   Take (1) 50 mg tablet of prednisone 7 hours prior to your procedure at 8 am.    Take (1) 50 mg tablet of prednisone and (1) 50 mg tablet of Benadryl 1 hour prior to your procedure at 2:00 pm.    STAT CT Chest [phone]   CT CHEST is 05/03/2023 ay 3:00 pm  [facility]   [place]   [address]  [address]   Due to recent changes in healthcare laws, you may see the results of your imaging and laboratory studies on MyChart before your provider has had a chance to review them.  We understand that in some cases there may be results that are confusing or concerning to you. Not all laboratory results come back in the same time frame and the provider may be waiting for multiple results in order to interpret others.  Please give [place] 48 hours in order for your provider to thoroughly review all the results before contacting the office for clarification of your results.    I appreciate the  opportunity to care for you  Thank You   Jacelyn Grip

## 2023-05-03 ENCOUNTER — Ambulatory Visit
Admission: RE | Admit: 2023-05-03 | Discharge: 2023-05-03 | Disposition: A | Discharge status: Home / Self Care | Source: Ambulatory Visit | Attending: Physician Assistant | Admitting: Physician Assistant

## 2023-05-03 ENCOUNTER — Ambulatory Visit: Admission: RE | Admit: 2023-05-03 | Source: Ambulatory Visit

## 2023-05-03 DIAGNOSIS — R053 Chronic cough: Secondary | ICD-10-CM | POA: Insufficient documentation

## 2023-05-03 DIAGNOSIS — R9389 Abnormal findings on diagnostic imaging of other specified body structures: Secondary | ICD-10-CM | POA: Diagnosis present

## 2023-05-03 MED ORDER — IOHEXOL 300 MG/ML  SOLN
75.0000 mL | Freq: Once | INTRAMUSCULAR | Status: AC | PRN
  Administered 2023-05-03: 75 mL via INTRAVENOUS

## 2023-05-04 ENCOUNTER — Ambulatory Visit: Admission: RE | Admit: 2023-05-04 | Source: Ambulatory Visit

## 2023-05-05 ENCOUNTER — Other Ambulatory Visit: Payer: Self-pay

## 2023-05-05 ENCOUNTER — Telehealth: Payer: Self-pay

## 2023-05-05 DIAGNOSIS — Z860101 Personal history of adenomatous and serrated colon polyps: Secondary | ICD-10-CM

## 2023-05-05 DIAGNOSIS — R9389 Abnormal findings on diagnostic imaging of other specified body structures: Secondary | ICD-10-CM

## 2023-05-05 MED ORDER — PLENVU 140 G PO SOLR: 1.0000 | ORAL | 0 refills | Status: DC

## 2023-05-05 NOTE — Telephone Encounter (Signed)
 Patient has been scheduled for Colon/EGD @ [facility] on 05/19/23. All instructions have been sent to patient via my chart and will be mailed to patient. Patient will let me know if he has any questions once he receives his instructions.

## 2023-05-05 NOTE — Telephone Encounter (Signed)
-----   Message from [facility] sent at 05/04/2023 10:42 AM EDT ----- Regarding: RE: Please look at CT JLL, This is very weird imaging. Agree needs Endoscopy. We can schedule him for 4/7 or 4/10 for EGD/EUS. Colonoscopy could be performed on 4/10 if he wants to do both (we should have enough time) but would need to be a 90 minute procedure block. Thanks. GM ----- Message ----- From: Unk Lightning, PA Sent: 05/04/2023  10:37 AM EDT To: Lemar Lofty., MD Subject: Please look at CT                              Please look at CT, looks like patient needs emergent EGD.  Please help me out with scheduling.  I guess colonoscopy can wait.  Thanks, JL L

## 2023-05-12 ENCOUNTER — Telehealth: Payer: Self-pay | Admitting: Gastroenterology

## 2023-05-12 NOTE — Telephone Encounter (Signed)
 Procedure:Colonoscopy/EUS Procedure date: 05/19/23 Procedure location: [facility] Arrival Time: 1:00 pm Spoke with the patient Y/N: Yes Any prep concerns? No  Has the patient obtained the prep from the pharmacy ? Yes Do you have a care partner and transportation: Yes Any additional concerns? No

## 2023-05-13 ENCOUNTER — Encounter (HOSPITAL_COMMUNITY): Payer: Self-pay | Admitting: Gastroenterology

## 2023-05-19 ENCOUNTER — Ambulatory Visit (HOSPITAL_COMMUNITY)

## 2023-05-19 ENCOUNTER — Ambulatory Visit (HOSPITAL_COMMUNITY)
Admission: RE | Admit: 2023-05-19 | Discharge: 2023-05-19 | Disposition: A | Discharge status: Home / Self Care | Attending: Gastroenterology | Admitting: Gastroenterology

## 2023-05-19 ENCOUNTER — Encounter (HOSPITAL_COMMUNITY)
Admission: RE | Disposition: A | Discharge status: Home / Self Care | Payer: Self-pay | Source: Home / Self Care | Attending: Gastroenterology

## 2023-05-19 ENCOUNTER — Other Ambulatory Visit: Payer: Self-pay

## 2023-05-19 DIAGNOSIS — Z1211 Encounter for screening for malignant neoplasm of colon: Secondary | ICD-10-CM | POA: Insufficient documentation

## 2023-05-19 DIAGNOSIS — Z87891 Personal history of nicotine dependence: Secondary | ICD-10-CM | POA: Insufficient documentation

## 2023-05-19 DIAGNOSIS — R933 Abnormal findings on diagnostic imaging of other parts of digestive tract: Secondary | ICD-10-CM | POA: Diagnosis not present

## 2023-05-19 DIAGNOSIS — K621 Rectal polyp: Secondary | ICD-10-CM | POA: Diagnosis not present

## 2023-05-19 DIAGNOSIS — D123 Benign neoplasm of transverse colon: Secondary | ICD-10-CM

## 2023-05-19 DIAGNOSIS — K2970 Gastritis, unspecified, without bleeding: Secondary | ICD-10-CM

## 2023-05-19 DIAGNOSIS — D128 Benign neoplasm of rectum: Secondary | ICD-10-CM | POA: Insufficient documentation

## 2023-05-19 DIAGNOSIS — R0789 Other chest pain: Secondary | ICD-10-CM | POA: Diagnosis not present

## 2023-05-19 DIAGNOSIS — Z79899 Other long term (current) drug therapy: Secondary | ICD-10-CM | POA: Insufficient documentation

## 2023-05-19 DIAGNOSIS — K3189 Other diseases of stomach and duodenum: Secondary | ICD-10-CM | POA: Diagnosis not present

## 2023-05-19 DIAGNOSIS — K644 Residual hemorrhoidal skin tags: Secondary | ICD-10-CM | POA: Insufficient documentation

## 2023-05-19 DIAGNOSIS — D490 Neoplasm of unspecified behavior of digestive system: Secondary | ICD-10-CM

## 2023-05-19 DIAGNOSIS — K5730 Diverticulosis of large intestine without perforation or abscess without bleeding: Secondary | ICD-10-CM | POA: Diagnosis not present

## 2023-05-19 DIAGNOSIS — K219 Gastro-esophageal reflux disease without esophagitis: Secondary | ICD-10-CM | POA: Diagnosis not present

## 2023-05-19 DIAGNOSIS — I10 Essential (primary) hypertension: Secondary | ICD-10-CM | POA: Insufficient documentation

## 2023-05-19 DIAGNOSIS — Q458 Other specified congenital malformations of digestive system: Secondary | ICD-10-CM

## 2023-05-19 DIAGNOSIS — K641 Second degree hemorrhoids: Secondary | ICD-10-CM | POA: Insufficient documentation

## 2023-05-19 DIAGNOSIS — Z860101 Personal history of adenomatous and serrated colon polyps: Secondary | ICD-10-CM

## 2023-05-19 DIAGNOSIS — K2289 Other specified disease of esophagus: Secondary | ICD-10-CM | POA: Diagnosis not present

## 2023-05-19 DIAGNOSIS — Z9889 Other specified postprocedural states: Secondary | ICD-10-CM

## 2023-05-19 DIAGNOSIS — R9389 Abnormal findings on diagnostic imaging of other specified body structures: Secondary | ICD-10-CM

## 2023-05-19 DIAGNOSIS — I899 Noninfective disorder of lymphatic vessels and lymph nodes, unspecified: Secondary | ICD-10-CM

## 2023-05-19 HISTORY — PX: BIOPSY OF SKIN SUBCUTANEOUS TISSUE AND/OR MUCOUS MEMBRANE: SHX6741

## 2023-05-19 HISTORY — PX: EUS: SHX5427

## 2023-05-19 HISTORY — PX: POLYPECTOMY: SHX149

## 2023-05-19 HISTORY — PX: COLONOSCOPY: SHX5424

## 2023-05-19 SURGERY — COLONOSCOPY
Anesthesia: Monitor Anesthesia Care

## 2023-05-19 MED ORDER — CIPROFLOXACIN IN D5W 400 MG/200ML IV SOLN
INTRAVENOUS | Status: AC
  Filled 2023-05-19: qty 200

## 2023-05-19 MED ORDER — PROPOFOL 500 MG/50ML IV EMUL
INTRAVENOUS | Status: DC | PRN
  Administered 2023-05-19: 150 ug/kg/min via INTRAVENOUS

## 2023-05-19 MED ORDER — PROPOFOL 10 MG/ML IV BOLUS
INTRAVENOUS | Status: DC | PRN
  Administered 2023-05-19: 50 mg via INTRAVENOUS
  Administered 2023-05-19 (×2): 30 mg via INTRAVENOUS
  Administered 2023-05-19: 20 mg via INTRAVENOUS

## 2023-05-19 MED ORDER — PROPOFOL 1000 MG/100ML IV EMUL
INTRAVENOUS | Status: AC
  Filled 2023-05-19: qty 100

## 2023-05-19 MED ORDER — LIDOCAINE 2% (20 MG/ML) 5 ML SYRINGE
INTRAMUSCULAR | Status: DC | PRN
  Administered 2023-05-19: 100 mg via INTRAVENOUS

## 2023-05-19 MED ORDER — SODIUM CHLORIDE 0.9 % IV SOLN: INTRAVENOUS | Status: DC

## 2023-05-19 MED ORDER — SODIUM CHLORIDE 0.9 % IV SOLN
INTRAVENOUS | Status: AC | PRN
  Administered 2023-05-19: 500 mL via INTRAMUSCULAR

## 2023-05-19 MED ORDER — PROPOFOL 1000 MG/100ML IV EMUL
INTRAVENOUS | Status: AC
Start: 2023-05-19 — End: ?
  Filled 2023-05-19: qty 100

## 2023-05-19 MED ORDER — CIPROFLOXACIN IN D5W 400 MG/200ML IV SOLN
INTRAVENOUS | Status: DC | PRN
  Administered 2023-05-19: 400 mg via INTRAVENOUS

## 2023-05-19 NOTE — Anesthesia Postprocedure Evaluation (Signed)
 Anesthesia Post Note  Patient: Taro Hidrogo.  Procedure(s) Performed: COLONOSCOPY ULTRASOUND, UPPER GI TRACT, ENDOSCOPIC BIOPSY, SKIN, SUBCUTANEOUS TISSUE, OR MUCOUS MEMBRANE POLYPECTOMY, INTESTINE     Patient location during evaluation: Endoscopy Anesthesia Type: MAC Level of consciousness: awake and alert Pain management: pain level controlled Vital Signs Assessment: post-procedure vital signs reviewed and stable Respiratory status: spontaneous breathing, nonlabored ventilation and respiratory function stable Cardiovascular status: stable and blood pressure returned to baseline Postop Assessment: no apparent nausea or vomiting Anesthetic complications: no  No notable events documented.  Last Vitals:  Vitals:   05/19/23 1500 05/19/23 1510  BP: 137/86 132/85  Pulse: 75 66  Resp: 19 16  Temp:    SpO2: 96% 99%    Last Pain:  Vitals:   05/19/23 1510  TempSrc:   PainSc: 0-No pain                 Zanylah Hardie,W. EDMOND

## 2023-05-19 NOTE — Transfer of Care (Signed)
 Immediate Anesthesia Transfer of Care Note  Patient: Dequavious Harshberger.  Procedure(s) Performed: COLONOSCOPY ULTRASOUND, UPPER GI TRACT, ENDOSCOPIC BIOPSY, SKIN, SUBCUTANEOUS TISSUE, OR MUCOUS MEMBRANE POLYPECTOMY, INTESTINE  Patient Location: PACU and Endoscopy Unit  Anesthesia Type:MAC  Level of Consciousness: awake and patient cooperative  Airway & Oxygen Therapy: Patient Spontanous Breathing and Patient connected to face mask oxygen  Post-op Assessment: Report given to RN and Post -op Vital signs reviewed and stable  Post vital signs: Reviewed and stable  Last Vitals:  Vitals Value Taken Time  BP 175/85 05/19/23 1448  Temp 36.3 C 05/19/23 1448  Pulse 66 05/19/23 1449  Resp 22 05/19/23 1449  SpO2 98 % 05/19/23 1449  Vitals shown include unfiled device data.  Last Pain:  Vitals:   05/19/23 1448  TempSrc: Temporal  PainSc: Asleep         Complications: No notable events documented.

## 2023-05-19 NOTE — Discharge Instructions (Signed)

## 2023-05-19 NOTE — Interval H&P Note (Signed)
 History and Physical Interval Note:  05/19/2023 1:14 PM  Jonathan Andrews.  has presented today for surgery, with the diagnosis of Abnormal CT of the chest, Hx of Colon Polyps.  The various methods of treatment have been discussed with the patient and family. After consideration of risks, benefits and other options for treatment, the patient has consented to  Procedure(s): COLONOSCOPY (N/A) ULTRASOUND, UPPER GI TRACT, ENDOSCOPIC (N/A) as a surgical intervention.  The patient's history has been reviewed, patient examined, no change in status, stable for surgery.  I have reviewed the patient's chart and labs.  Questions were answered to the patient's satisfaction.     [employer]

## 2023-05-19 NOTE — Op Note (Signed)
 [facility] Patient Name: Jonathan Andrews Procedure Date: 05/19/2023 MRN: [id number] Attending MD: Corliss Parish , MD, [id number] Date of Birth: 10-31-1955 CSN: [id number] Age: 68 Admit Type: Outpatient Procedure:                Colonoscopy Indications:              Surveillance: Personal history of adenomatous                            polyps on last colonoscopy 3 years ago, High risk                            colon cancer surveillance: Personal history of                            adenoma (10 mm or greater in size) was to repeat                            turn in 2023 but did not Providers:                Corliss Parish, MD, Margaree Mackintosh, RN,                            Kandice Robinsons, Technician Referring MD:              Medicines:                Monitored Anesthesia Care Complications:            No immediate complications. Estimated Blood Loss:     Estimated blood loss was minimal. Procedure:                Pre-Anesthesia Assessment:                           - Prior to the procedure, a History and Physical                            was performed, and patient medications and                            allergies were reviewed. The patient's tolerance of                            previous anesthesia was also reviewed. The risks                            and benefits of the procedure and the sedation                            options and risks were discussed with the patient.                            All questions were answered, and informed consent  was obtained. Prior Anticoagulants: The patient has                            taken no anticoagulant or antiplatelet agents. ASA                            Grade Assessment: II - A patient with mild systemic                            disease. After reviewing the risks and benefits,                            the patient was deemed in satisfactory condition to                             undergo the procedure.                           After obtaining informed consent, the colonoscope                            was passed under direct vision. Throughout the                            procedure, the patient's blood pressure, pulse, and                            oxygen saturations were monitored continuously. The                            CF-HQ190L ([id number]) Olympus colonoscope was                            introduced through the anus and advanced to the the                            cecum, identified by appendiceal orifice and                            ileocecal valve. The colonoscopy was performed                            without difficulty. The patient tolerated the                            procedure. The quality of the bowel preparation was                            adequate. The ileocecal valve, appendiceal orifice,                            and rectum were photographed. Scope In: 2:21:05 PM Scope Out: 2:42:23 PM Scope Withdrawal Time: 0 hours 18 minutes 31 seconds  Total Procedure Duration: 0 hours 21  minutes 18 seconds  Findings:      Skin tags were found on perianal exam.      The digital rectal exam findings include hemorrhoids. Pertinent       negatives include no palpable rectal lesions.      The colon (entire examined portion) was significantly redundant leading       to significant looping.      A medium post mucosectomy scar was found in the transverse colon with       tattoo on contralateral wall. The scar tissue was healthy in appearance.       There was no evidence of the previous polyp.      A 4 mm polyp was found in the transverse colon. The polyp was sessile.       The polyp was removed with a cold snare. Resection and retrieval were       complete.      A 6 mm polyp was found in the rectum. The polyp was sessile. The polyp       was removed with a cold snare. Resection and retrieval were complete.      Multiple  small-mouthed diverticula were found in the recto-sigmoid colon       and sigmoid colon.      Normal mucosa was found in the entire colon otherwise.      Non-bleeding non-thrombosed external and internal hemorrhoids were found       during retroflexion, during perianal exam and during digital exam. The       hemorrhoids were Grade II (internal hemorrhoids that prolapse but reduce       spontaneously). Impression:               - Perianal skin tags found on perianal exam.                            Hemorrhoids found on digital rectal exam.                           - Redundant colon leading to looping.                           - Post mucosectomy scar in the transverse colon                            with tattoo present.                           - One 4 mm polyp in the transverse colon, removed                            with a cold snare. Resected and retrieved.                           - One 6 mm polyp in the rectum, removed with a cold                            snare. Resected and retrieved.                           -  Diverticulosis in the recto-sigmoid colon and in                            the sigmoid colon.                           - Normal mucosa in the entire examined colon                            otherwise.                           - Non-bleeding non-thrombosed external and internal                            hemorrhoids. Moderate Sedation:      Not Applicable - Patient had care per Anesthesia. Recommendation:           - The patient will be observed post-procedure,                            until all discharge criteria are met.                           - Discharge patient to home.                           - Patient has a contact number available for                            emergencies. The signs and symptoms of potential                            delayed complications were discussed with the                            patient. Return to normal activities  tomorrow.                            Written discharge instructions were provided to the                            patient.                           - High fiber diet.                           - Use FiberCon 1-2 tablets PO daily.                           - Continue present medications.                           - Await pathology results.                           -  Repeat colonoscopy in 5 years for surveillance,                            no matter pathology due to history of advanced                            adenoma.. Patient should never go further than 5                            years between procedures due to history of advanced                            adenoma.                           - The findings and recommendations were discussed                            with the patient.                           - The findings and recommendations were discussed                            with the patient's family. Procedure Code(s):        --- Professional ---                           [phone], Colonoscopy, flexible; with removal of                            tumor(s), polyp(s), or other lesion(s) by snare                            technique Diagnosis Code(s):        --- Professional ---                           W11.914, Other specified postprocedural states                           D12.3, Benign neoplasm of transverse colon (hepatic                            flexure or splenic flexure)                           D12.8, Benign neoplasm of rectum                           K64.1, Second degree hemorrhoids                           Z86.010, Personal history of colonic polyps                           K57.30,  Diverticulosis of large intestine without                            perforation or abscess without bleeding                           Q43.8, Other specified congenital malformations of                            intestine CPT copyright 2022 American Medical Association. All  rights reserved. The codes documented in this report are preliminary and upon coder review may  be revised to meet current compliance requirements. Corliss Parish, MD 05/19/2023 3:08:32 PM Number of Addenda: 0

## 2023-05-19 NOTE — Op Note (Signed)
 [facility] Patient Name: Jonathan Andrews Procedure Date: 05/19/2023 MRN: [id number] Attending MD: Corliss Parish , MD, [id number] Date of Birth: [birth year] CSN: [id number] Age: 68 Admit Type: Outpatient Procedure:                Upper EUS Indications:              Suspected mass in esophagus on chest CT, Chest pain                            (non cardiac) Providers:                Corliss Parish, MD, Margaree Mackintosh, RN,                            Kandice Robinsons, Technician Referring MD:              Medicines:                Monitored Anesthesia Care, Cipro 400 mg IV Complications:            No immediate complications. Estimated Blood Loss:     Estimated blood loss was minimal. Procedure:                Pre-Anesthesia Assessment:                           - Prior to the procedure, a History and Physical                            was performed, and patient medications and                            allergies were reviewed. The patient's tolerance of                            previous anesthesia was also reviewed. The risks                            and benefits of the procedure and the sedation                            options and risks were discussed with the patient.                            All questions were answered, and informed consent                            was obtained. Prior Anticoagulants: The patient has                            taken no anticoagulant or antiplatelet agents. ASA                            Grade Assessment: II - A patient with mild systemic  disease. After reviewing the risks and benefits,                            the patient was deemed in satisfactory condition to                            undergo the procedure.                           After obtaining informed consent, the endoscope was                            passed under direct vision. Throughout the                             procedure, the patient's blood pressure, pulse, and                            oxygen saturations were monitored continuously. The                            [id number]-[id number] ([id number]) Olympus endoscope was introduced                            through the mouth, and advanced to the second part                            of duodenum. The GF-[id number]-[id number] ([id number]) Olympus                            radial ultrasound scope was introduced through the                            mouth, and advanced to the stomach for ultrasound                            examination from the esophagus and stomach. The                            upper EUS was accomplished without difficulty. The                            patient tolerated the procedure. Scope In: Scope Out: Findings:      ENDOSCOPIC FINDING: :      A medium-sized, submucosal lesion with no bleeding and no stigmata of       recent bleeding was found in the proximal esophagus, 21-25 cm from the       incisors. The lesion was non-obstructing and partially circumferential       (involving one-third of the lumen circumference).      No other gross lesions were noted in the entire esophagus.      The Z-line was irregular and was found 41 cm from the incisors.      Patchy mildly erythematous mucosa without bleeding was found in the  entire examined stomach. Biopsies were taken with a cold forceps for       histology and Helicobacter pylori testing.      No gross lesions were noted in the duodenal bulb, in the first portion       of the duodenum and in the second portion of the duodenum.      ENDOSONOGRAPHIC FINDING: :      A lobulated intramural (subepithelial) lesion was found in the upper       third of the esophagus. It was encountered at 21 cm from the incisors       and extended to 25 cm. The lesion was calcified, lobulated and had a       cystic appearance with what appeared to be fluid within the larger       cavity. Sonographically, the origin  appeared to be within the muscularis       propria (Layer 4). The lesion measured up to 30 mm in thickness and 15       mm in thickness. The endosonographic borders were smooth.      Endosonographic imaging in the rest of the upper third of the esophagus       and in the middle third of the esophagus showed no extrinsic       compression, intramural (subepithelial) lesion, mass, stricture, varices       or wall thickening.      No malignant-appearing lymph nodes were visualized in the anterior       mediastinum (level 6), subcarinal mediastinum (level 7), middle       paraesophageal mediastinum (level 7M) and lower paraesophageal       mediastinum (level 8L).      Endosonographic imaging in the visualized portion of the liver showed no       mass.      The celiac region was visualized. Impression:               EGD impression:                           - Esophageal subepithelial lesion was found in the                            proximal esophagus (21 to 25 cm).                           - No other gross lesions in the entire esophagus.                           - Z-line irregular, 41 cm from the incisors.                           - Erythematous mucosa in the stomach. Biopsied.                           - No gross lesions in the duodenal bulb, in the                            first portion of the duodenum and in the second  portion of the duodenum.                           EUS impression:                           - An intramural (subepithelial) lesion that was                            calcified, lobulated, and cystic with fluid within                            the cavity, was found in the upper third of the                            esophagus. It appeared to originate from within the                            muscularis propria (Layer 4). Tissue has not been                            obtained. 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 This is the                            more significant finding rather than being a                            leiomyoma or just which typically do not have                            cystic components and fluid within.                           - Endosonographically no other findings noted in                            the esophagus.                           - No malignant-appearing lymph nodes were                            visualized in the anterior mediastinum (level 6),                            subcarinal mediastinum (level 7), middle                            paraesophageal mediastinum (level 54M) and lower                            paraesophageal mediastinum (level 8L).  Moderate Sedation:      Not Applicable - Patient had care per Anesthesia. Recommendation:           - Proceed to scheduled colonoscopy.                           - Observe patient's clinical course.                           - Await path results.                           - Will work on presenting patient's case at                            multidisciplinary conference and potentially                            discussed with a Chief Strategy Officer. If this really                            is a duplication cyst, and he is nearly                            asymptomatic from having it, I do not sure that                            anything needs to be done for this. With this being                            said, high risk for mediastinitis occurring in                            aspiration/biopsy attempts of duplication cysts, so                            would only want to pursue if this would change                            management.                           - If we are going to pursue surveillance, would                            plan potential repeat EUS and repeat                            cross-sectional imaging to ensure overall stability                             of size. Timing TBD.                           - The findings and recommendations were discussed  with the patient.                           - The findings and recommendations were discussed                            with the patient's family. Procedure Code(s):        --- Professional ---                           [phone], Esophagogastroduodenoscopy, flexible,                            transoral; with endoscopic ultrasound examination                            limited to the esophagus, stomach or duodenum, and                            adjacent structures                           43239, Esophagogastroduodenoscopy, flexible,                            transoral; with biopsy, single or multiple Diagnosis Code(s):        --- Professional ---                           D49.0, Neoplasm of unspecified behavior of                            digestive system                           K22.89, Other specified disease of esophagus                           K31.89, Other diseases of stomach and duodenum                           R07.89, Other chest pain                           R93.3, Abnormal findings on diagnostic imaging of                            other parts of digestive tract CPT copyright 2022 American Medical Association. All rights reserved. The codes documented in this report are preliminary and upon coder review may  be revised to meet current compliance requirements. Corliss Parish, MD 05/19/2023 3:00:59 PM Number of Addenda: 0

## 2023-05-19 NOTE — Anesthesia Preprocedure Evaluation (Addendum)
 Anesthesia Evaluation  Patient identified by MRN, date of birth, ID band Patient awake    Reviewed: Allergy & Precautions, H&P , NPO status , Patient's Chart, lab work & pertinent test results  Airway Mallampati: III  TM Distance: >3 FB Neck ROM: Full    Dental no notable dental hx. (+) Teeth Intact, Dental Advisory Given   Pulmonary former smoker   Pulmonary exam normal breath sounds clear to auscultation       Cardiovascular hypertension, Pt. on medications  Rhythm:Regular Rate:Normal     Neuro/Psych negative neurological ROS  negative psych ROS   GI/Hepatic Neg liver ROS,GERD  ,,  Endo/Other  negative endocrine ROS    Renal/GU negative Renal ROS  negative genitourinary   Musculoskeletal   Abdominal   Peds  Hematology negative hematology ROS (+)   Anesthesia Other Findings   Reproductive/Obstetrics negative OB ROS                             Anesthesia Physical Anesthesia Plan  ASA: 2  Anesthesia Plan: MAC   Post-op Pain Management: Minimal or no pain anticipated   Induction: Intravenous  PONV Risk Score and Plan: 1 and Propofol infusion  Airway Management Planned: Natural Airway and Simple Face Mask  Additional Equipment:   Intra-op Plan:   Post-operative Plan:   Informed Consent: I have reviewed the patients History and Physical, chart, labs and discussed the procedure including the risks, benefits and alternatives for the proposed anesthesia with the patient or authorized representative who has indicated his/her understanding and acceptance.     Dental advisory given  Plan Discussed with: CRNA  Anesthesia Plan Comments:        Anesthesia Quick Evaluation

## 2023-05-20 ENCOUNTER — Encounter: Payer: Self-pay | Admitting: Gastroenterology

## 2023-05-20 LAB — SURGICAL PATHOLOGY

## 2023-05-22 ENCOUNTER — Encounter (HOSPITAL_COMMUNITY): Payer: Self-pay | Admitting: Gastroenterology

## 2023-05-31 ENCOUNTER — Other Ambulatory Visit: Payer: Self-pay

## 2023-05-31 ENCOUNTER — Telehealth: Payer: Self-pay | Admitting: Gastroenterology

## 2023-05-31 MED ORDER — PANTOPRAZOLE SODIUM 20 MG PO TBEC: 20.0000 mg | DELAYED_RELEASE_TABLET | Freq: Two times a day (BID) | ORAL | 6 refills | Status: DC

## 2023-05-31 NOTE — Telephone Encounter (Signed)
 Staff message to call pt in 6 months for repeat CT  Prescription has been sent as ordered

## 2023-05-31 NOTE — Telephone Encounter (Signed)
 I have discussed this patient's case with thoracic surgery and overall we do feel that this is likely to be a benign lesion based on his overall clinical status and symptoms (no dysphagia very minimal GERD like symptoms). Surgery would require a cervical esophageal anastomosis which would be high risk for leak or other complications and would really only be performed if patient had true malignancy. The thought of whether a PET/CT could be of assistance to help guide whether there is hyperactivity that then causes us  to consider high risk biopsy/aspiration and surgical options was felt to be something that we could consider.  I called and spoke with the patient. He describes some mild GERD symptoms of pyrosis for which he does take pantoprazole  once daily for the last 6 weeks with minimal breakthrough. No dysphagia symptoms.  Here is the plan of action: 1) we will plan repeat cross-sectional imaging PET/CT chest in 6 months 2) if unable to get PET/CT covered by insurance for this esophageal nodule/lesion, then will plan CT chest with contrast as was just performed recently 3) upper endoscopic ultrasound will be considered after the results of the cross-sectional imaging are repeated 4) pantoprazole  20 mg twice daily (60/6) to be sent into patient's pharmacy in [place].  Patient appreciative for the care he is received through [facility].   Yong Henle, MD [facility]oscopy Office # [phone]

## 2023-06-01 ENCOUNTER — Ambulatory Visit: Admitting: Gastroenterology

## 2023-09-16 ENCOUNTER — Telehealth: Payer: Self-pay | Admitting: Gastroenterology

## 2023-09-16 NOTE — Telephone Encounter (Signed)
 Patient is calling to find out the status of his stat CT order. Patient is requesting a call back from the nurse. Please advise.

## 2023-09-16 NOTE — Telephone Encounter (Signed)
 The pt is not due until October- he is aware we will call at that time. See note below from April 2025   Staff message to call pt in 6 months for repeat CT

## 2023-11-30 ENCOUNTER — Telehealth: Payer: Self-pay

## 2023-11-30 ENCOUNTER — Other Ambulatory Visit: Payer: Self-pay | Admitting: Gastroenterology

## 2023-11-30 DIAGNOSIS — R9389 Abnormal findings on diagnostic imaging of other specified body structures: Secondary | ICD-10-CM

## 2023-11-30 DIAGNOSIS — K2289 Other specified disease of esophagus: Secondary | ICD-10-CM

## 2023-11-30 MED ORDER — PREDNISONE 50 MG PO TABS
ORAL_TABLET | ORAL | 0 refills | Status: DC
Start: 2023-11-30 — End: 2023-12-01

## 2023-11-30 NOTE — Telephone Encounter (Signed)
-----   Message from [place] sent at 11/30/2023  3:27 PM EDT ----- Pt needs 13 hr prep, scheduled    Cy ----- Message ----- From: Teodoro Izetta DEL Sent: 11/30/2023  10:25 AM EDT To: Madeline JINNY Pinal   ----- Message ----- From: Anitra Odetta CROME, RN Sent: 11/30/2023  10:06 AM EDT To: April H Pait; Andree CHRISTELLA Ang  CT chest needed please see order

## 2023-11-30 NOTE — Telephone Encounter (Signed)
-----   Message from Nurse Haevyn Ury P sent at 05/31/2023  1:54 PM EDT ----- repeat cross-sectional imaging PET/CT chest in 6 months

## 2023-11-30 NOTE — Telephone Encounter (Signed)
 Your records indicate that you have an allergy/sensitivity to one or more components within IV contrast dye. We have sent a prescription of Prednisone  50 mg (3 tablets) and Benadryl  50 mg (1 tablet) over the counter to your pharmacy as a pre-mediation for your contrasted procedure which should prevent any reaction from occurring.  Take (1) 50 mg tablet of prednisone  13 hours prior to your procedure.  Take (1) 50 mg tablet of prednisone  7 hours prior to your procedure. Take (1) 50 mg tablet of prednisone  and (1) 50 mg tablet of Benadryl  1 hour prior to your procedure.    The pt has been advised of the need for prep.  Prescriptions have been sent to the pharmacy

## 2023-11-30 NOTE — Telephone Encounter (Signed)
 CT scan of the chest has been entered and sent to the schedulers.  The pt has been advised

## 2023-12-02 ENCOUNTER — Ambulatory Visit
Admission: RE | Admit: 2023-12-02 | Discharge: 2023-12-02 | Disposition: A | Discharge status: Home / Self Care | Source: Ambulatory Visit | Attending: Gastroenterology | Admitting: Gastroenterology

## 2023-12-02 DIAGNOSIS — R9389 Abnormal findings on diagnostic imaging of other specified body structures: Secondary | ICD-10-CM | POA: Insufficient documentation

## 2023-12-02 DIAGNOSIS — K2289 Other specified disease of esophagus: Secondary | ICD-10-CM | POA: Diagnosis present

## 2023-12-02 MED ORDER — IOHEXOL 300 MG/ML  SOLN
100.0000 mL | Freq: Once | INTRAMUSCULAR | Status: AC | PRN
  Administered 2023-12-02: 100 mL via INTRAVENOUS

## 2023-12-06 ENCOUNTER — Ambulatory Visit: Payer: Self-pay | Admitting: Gastroenterology

## 2023-12-06 ENCOUNTER — Other Ambulatory Visit: Payer: Self-pay

## 2023-12-06 DIAGNOSIS — K2289 Other specified disease of esophagus: Secondary | ICD-10-CM

## 2023-12-09 ENCOUNTER — Encounter: Admitting: Thoracic Surgery (Cardiothoracic Vascular Surgery)

## 2023-12-09 ENCOUNTER — Ambulatory Visit
Attending: Thoracic Surgery (Cardiothoracic Vascular Surgery) | Admitting: Thoracic Surgery (Cardiothoracic Vascular Surgery)

## 2023-12-09 VITALS — BP 153/87 | HR 68 | Resp 20 | Ht 76.0 in | Wt 254.0 lb

## 2023-12-09 DIAGNOSIS — K2289 Other specified disease of esophagus: Secondary | ICD-10-CM | POA: Diagnosis not present

## 2023-12-09 NOTE — Progress Notes (Signed)
 [address]       [place] [address]             [phone]                    Jonathan Andrews. [facility] #[id number] Date of Birth: Aug 24, 1955  Referring: [name], MD Primary Care: [name], MD Primary Cardiologist: None  Chief Complaint:    Chief Complaint  Patient presents with   Esophageal Lesion,mass    Review workup    History of Present Illness:    Jonathan Andrews. 68 y.o. male presents for surgical evaluation of a calcified esophageal mass.  This was originally found incidentally when he was being worked up for chronic cough.  He states that the cough has been present for several years.  He denies any dysphagia or odynophagia.    Past Medical History:  Diagnosis Date   Allergy    Bladder stones    GERD (gastroesophageal reflux disease)    Hemorrhoids    History of kidney stones    Hypertension     Past Surgical History:  Procedure Laterality Date   BIOPSY OF SKIN SUBCUTANEOUS TISSUE AND/OR MUCOUS MEMBRANE  05/19/2023   Procedure: BIOPSY, SKIN, SUBCUTANEOUS TISSUE, OR MUCOUS MEMBRANE;  Surgeon: Wilhelmenia Aloha Andrews., MD;  Location: [facility];  Service: Gastroenterology;;   COLONOSCOPY N/A 08/15/2020   Procedure: COLONOSCOPY;  Surgeon: Legrand Victory LITTIE DOUGLAS, MD;  Location: [facility];  Service: Gastroenterology;  Laterality: N/A;   COLONOSCOPY N/A 05/19/2023   Procedure: COLONOSCOPY;  Surgeon: Wilhelmenia Aloha Andrews., MD;  Location: [facility];  Service: Gastroenterology;  Laterality: N/A;   COLONOSCOPY WITH PROPOFOL  N/A 10/27/2020   Procedure: COLONOSCOPY WITH PROPOFOL ;  Surgeon: Wilhelmenia Aloha Andrews., MD;  Location: [facility];  Service: Gastroenterology;  Laterality: N/A;   CYSTOSCOPY WITH LITHOLAPAXY N/A 03/27/2019   Procedure: CYSTOSCOPY WITH LITHOLAPAXY;  Surgeon: Twylla Glendia BROCKS, MD;  Location: [facility];  Service: Urology;  Laterality: N/A;   ENDOSCOPIC MUCOSAL RESECTION N/A 10/27/2020    Procedure: ENDOSCOPIC MUCOSAL RESECTION;  Surgeon: Wilhelmenia Aloha Andrews., MD;  Location: [facility];  Service: Gastroenterology;  Laterality: N/A;   EUS N/A 05/19/2023   Procedure: ULTRASOUND, UPPER GI TRACT, ENDOSCOPIC;  Surgeon: Wilhelmenia Aloha Andrews., MD;  Location: [facility];  Service: Gastroenterology;  Laterality: N/A;   EVALUATION UNDER ANESTHESIA WITH HEMORRHOIDECTOMY N/A 04/17/2020   Procedure: HEMORRHOIDECTOMY;  Surgeon: Dasie Leonor LITTIE, MD;  Location: [facility];  Service: General;  Laterality: N/A;   EXTRACORPOREAL SHOCK WAVE LITHOTRIPSY Left 08/02/2019   Procedure: EXTRACORPOREAL SHOCK WAVE LITHOTRIPSY (ESWL);  Surgeon: Twylla Glendia BROCKS, MD;  Location: [facility];  Service: Urology;  Laterality: Left;   HAND SURGERY     HAND SURGERY Bilateral    HEMOSTASIS CLIP PLACEMENT  10/27/2020   Procedure: HEMOSTASIS CLIP PLACEMENT;  Surgeon: Wilhelmenia Aloha Andrews., MD;  Location: [facility];  Service: Gastroenterology;;   POLYPECTOMY  05/19/2023   Procedure: POLYPECTOMY, INTESTINE;  Surgeon: Wilhelmenia Aloha Andrews., MD;  Location: [facility];  Service: Gastroenterology;;   RECTAL EXAM UNDER ANESTHESIA  04/17/2020   Procedure: RECTAL EXAM UNDER ANESTHESIA;  Surgeon: Dasie Leonor LITTIE, MD;  Location: [facility];  Service: General;;   SUBMUCOSAL LIFTING INJECTION  10/27/2020   Procedure: SUBMUCOSAL LIFTING INJECTION;  Surgeon: Wilhelmenia Aloha Andrews., MD;  Location: [facility];  Service: Gastroenterology;;   TENNIS ELBOW RELEASE/NIRSCHEL PROCEDURE Bilateral 04/14/2017   Procedure: TENNIS ELBOW  RELEASE/NIRSCHEL PROCEDURE;  Surgeon: Kathlynn Sharper, MD;  Location: [facility];  Service: Orthopedics;  Laterality: Bilateral;   TENNIS ELBOW RELEASE/NIRSCHEL PROCEDURE Right 01/03/2018   Procedure: TENNIS ELBOW RELEASE/NIRSCHEL PROCEDURE;  Surgeon: Kathlynn Sharper, MD;  Location: [facility];  Service: Orthopedics;  Laterality: Right;   TONSILLECTOMY     UMBILICAL HERNIA REPAIR  08/22/2020   Procedure: HERNIA REPAIR  UMBILICAL ADULT WITH MESH;  Surgeon: Tye Millet, DO;  Location: [facility];  Service: General;;    Family History  Problem Relation Age of Onset   Prostate cancer Father    Bladder Cancer Neg Hx    Kidney cancer Neg Hx    Colon cancer Neg Hx    Esophageal cancer Neg Hx    Inflammatory bowel disease Neg Hx    Liver disease Neg Hx    Rectal cancer Neg Hx    Stomach cancer Neg Hx    Pancreatic cancer Neg Hx      Social History   Tobacco Use  Smoking Status Former   Current packs/day: 0.00   Average packs/day: 2.0 packs/day for 12.0 years (24.0 ttl pk-yrs)   Types: Cigarettes   Start date: 03/24/1974   Quit date: 03/24/1986   Years since quitting: 37.7  Smokeless Tobacco Never    Social History   Substance and Sexual Activity  Alcohol Use Yes   Alcohol/week: 0.0 standard drinks of alcohol   Comment: rarely     Allergies  Allergen Reactions   Penicillins Hives    Has patient had a PCN reaction causing immediate rash, facial/tongue/throat swelling, SOB or lightheadedness with hypotension: No Has patient had a PCN reaction causing severe rash involving mucus membranes or skin necrosis: No Has patient had a PCN reaction that required hospitalization: No Has patient had a PCN reaction occurring within the last 10 years: Unknown If all of the above answers are NO, then may proceed with Cephalosporin use.    Contrast Media [Iodinated Contrast Media] Other (See Comments)    migraine    Hydrocodone -Acetaminophen  Itching    No rash    Current Outpatient Medications  Medication Sig Dispense Refill   azelastine (ASTELIN) 0.1 % nasal spray Place 2 sprays into both nostrils daily as needed (vertigo). Use in each nostril as directed     bisacodyl (DULCOLAX) 5 MG EC tablet Take 5 mg by mouth daily.     meclizine (ANTIVERT) 25 MG tablet Take 25 mg by mouth 3 (three) times daily as needed (vertigo).     Multiple Vitamin (MULTIVITAMIN WITH MINERALS) TABS tablet Take 1 tablet by  mouth daily. Vita Pak 50 +     olmesartan (BENICAR) 20 MG tablet Take 20 mg by mouth daily.     pantoprazole  (PROTONIX ) 20 MG tablet Take 1 tablet (20 mg total) by mouth 2 (two) times daily. 60 tablet 6   polycarbophil (FIBERCON) 625 MG tablet Take 1 tablet (625 mg total) by mouth daily. 30 tablet 2   No current facility-administered medications for this visit.    Review of Systems  Constitutional:  Negative for malaise/fatigue.  Respiratory:  Positive for cough. Negative for shortness of breath.   Cardiovascular:  Negative for chest pain.  Gastrointestinal:  Negative for heartburn and nausea.  Neurological:  Negative for dizziness.    PHYSICAL EXAMINATION: BP (!) 153/87 (BP Location: Right Arm, Patient Position: Sitting, Cuff Size: Normal)   Pulse 68   Resp 20   Ht 6' 4 (1.93 m)   Wt 254 lb (115.2 kg)  SpO2 97%   BMI 30.92 kg/m   Physical Exam Constitutional:      General: He is not in acute distress.    Appearance: He is not ill-appearing.  HENT:     Head: Normocephalic and atraumatic.  Eyes:     Extraocular Movements: Extraocular movements intact.  Cardiovascular:     Rate and Rhythm: Normal rate.  Pulmonary:     Effort: Pulmonary effort is normal. No respiratory distress.  Abdominal:     General: Abdomen is flat. There is no distension.  Musculoskeletal:        General: Normal range of motion.     Cervical back: Normal range of motion.  Skin:    General: Skin is warm and dry.  Neurological:     General: No focal deficit present.     Mental Status: He is alert and oriented to person, place, and time.      Diagnostic Studies & Laboratory data:     Recent Radiology Findings:   CT CHEST W CONTRAST Result Date: 12/04/2023 CLINICAL DATA:  Esophageal mass * Tracking Code: BO * EXAM: CT CHEST WITH CONTRAST TECHNIQUE: Multidetector CT imaging of the chest was performed during intravenous contrast administration. RADIATION DOSE REDUCTION: This exam was performed  according to the departmental dose-optimization program which includes automated exposure control, adjustment of the mA and/or kV according to patient size and/or use of iterative reconstruction technique. CONTRAST:  OMNIPAQUE  IOHEXOL  300 MG/ML  SOLN COMPARISON:  05/03/2023 FINDINGS: Cardiovascular: No significant vascular findings. Normal heart size. Enlargement of the main pulmonary artery measuring up to 3.7 cm in caliber. No pericardial effusion. Mediastinum/Nodes: No definite enlarged mediastinal, hilar, or axillary lymph nodes. 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 Thyroid gland and trachea demonstrate no significant findings. Lungs/Pleura: Mild, bandlike scarring of the bilateral lung bases. No pleural effusion or pneumothorax. Upper Abdomen: No acute abnormality.  Hepatic steatosis. Musculoskeletal: No chest wall abnormality. No acute osseous findings. IMPRESSION: 1. Unchanged partially concentric, heterogeneously calcified mass about the left aspect of the upper esophagus, just above the level of the aortic arch, partially compressing the lumen and measuring 3.0 x 2.8 x 1.7 cm. This is of uncertain nature, differential considerations as previously discussed, generally including calcified esophageal leiomyoma, mucosal lymphovenous malformation, etc. Calcification and imaging stability generally suggest a benign etiology. 2. No evidence of lymphadenopathy or metastatic disease in the chest. 3. Enlargement of the main pulmonary artery, as can be seen in pulmonary hypertension. 4. Hepatic steatosis. Aortic Atherosclerosis (ICD10-I70.0). Electronically Signed   By: Marolyn JONETTA Jaksch M.D.   On: 12/04/2023 13:38       I have independently reviewed the above radiology studies  and reviewed the  findings with the patient.   Recent Lab Findings: Lab Results  Component Value Date   WBC 5.2 05/02/2023   HGB 16.6 05/02/2023   HCT 49.1 05/02/2023   PLT 177.0 05/02/2023   GLUCOSE 84 05/02/2023   ALT 33 05/02/2023   AST 24 05/02/2023   NA 140 05/02/2023   K 4.2 05/02/2023   CL 104 05/02/2023   CREATININE 1.16 05/02/2023   BUN 17 05/02/2023   CO2 29 05/02/2023   TSH 1.640 08/23/2019   EGD impression: - Esophageal subepithelial lesion was found in the proximal esophagus ( 21  to 25 cm) . - No other gross lesions in the entire esophagus. - Z- line irregular, 41 cm from the incisors. - Erythematous mucosa in the stomach. Biopsied. - No gross lesions in the duodenal bulb, in the first portion of the duodenum and in the second portion of the duodenum.  EUS impression: - An intramural ( subepithelial) lesion that was calcified, lobulated, and cystic with fluid within the cavity, was found in the upper third of the esophagus. It appeared to originate from within the muscularis propria ( Layer 4) . Tissue has not been obtained. 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 This is the more significant finding rather than being a leiomyoma or just which typically do not have cystic components and fluid within. - Endosonographically no other findings noted in the esophagus. - No malignant- appearing lymph nodes were visualized in the anterior mediastinum ( level 6) , subcarinal mediastinum ( level 7) , middle paraesophageal mediastinum ( level 62M)     Assessment / Plan:   68 year old male with a calcified esophageal lesion.  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  He is essentially asymptomatic from this.  However surgery to resect this would be quite challenging and complicated given its location.  Recommended continued surveillance.  This will be done by his primary care physician.      Linnie MALVA Rayas 12/09/2023 3:19 PM

## 2023-12-12 NOTE — Progress Notes (Signed)
 Subjective:    Chief Complaint  Patient presents with  . Nausea    X 1 week Started Tadalafil 5 mg daily, 3 weeks ago  States he stopped taking it about a week ago States having intermittent dizziness, HA, nausea, diarrhea, gas Denies visual disturbances, chest pain, SOB  Diarrhea x4 today yellowish color, feels bloated   . Diarrhea  . Dizziness    History was provided by the patient. Jonathan Andrews is a 68 y.o., male  who presents with 1 week history of mild remittent headache, dizziness, nausea, and diarrhea.  Reports feels well typically when he gets up in the morning, 2 hours later, begins with after mentioned symptoms.  Reports yesterday halfway through the day symptoms seem to resolve.  Now feels worse again today.  Denies any known usual exposures or sick contacts.  Denies any suspect foods.  No sick household members.  No sore throat or cough; no chest pain or shortness of breath; no vomiting; no fever or chills, does report some sweats when he feels nauseous.  Reports last colonoscopy was earlier this year, couple of small polyps, otherwise normal.  Colonoscopy report from April of this year, does reveal diverticulosis of the rectosigmoid colon. Symptoms include: above   Patient denies: above Treatment to date: above   Past Medical History:  Diagnosis Date  . GERD (gastroesophageal reflux disease)   . Hemorrhoids   . Kidney stones   . Vertigo    The following portions of the patient's history were reviewed and updated as appropriate: allergies, current medications, past social history, and problem list.  Review of Systems A complete review of systems was performed.  Positive and pertinent negative responses are documented in the HPI, and all other systems are negative.   Objective:     Vitals:   12/12/23 1826  BP: 125/78  Pulse: 95  Temp: 36.5 C (97.7 F)  TempSrc: Oral  SpO2: 96%  Weight: (!) 110.7 kg (244 lb)  Height: 193 cm (6' 4)  PainSc: 0-No pain     General Appearance:  Well-developed, well-nourished.  Pleasant and cooperative.  No acute distress.  Here with male companion. HEENT:  [place]/AT, PERRLA, EOMI, sclera clear, conjunctivae pink.   Mouth and throat: Posterior oropharynx clear; tongue midline. TM's: clear bilaterally.  Neck:  Supple, no JVD or adenopathy. Cardiovascular:  Regular rate and rhythm.  Lungs:  Clear to auscultation.  Abdomen:  Soft, nontender, mildly distended.  Hyperactive bowel sounds.  No rebound or guarding. Back: No CVA tenderness. Extremities:   No cyanosis, clubbing, or edema. Neuro:  Alert and orient x4; nonfocal.    Lab/X-ray/Treatments:   Orthostatics-unremarkable  CBC-9.0 K, mild left shift UA-concentrated, 1+ bili, 11 WBCs per hpf.       Assessment:      1. Acute diverticulitis  2. Diarrhea, unspecified type -     CBC w/auto Differential (5 Part) -     Urinalysis w/Microscopic -     ciprofloxacin  HCl (CIPRO ) 500 MG tablet; Take 1 tablet (500 mg total) by mouth 2 (two) times daily for 10 days  Dispense: 20 tablet; Refill: 0 -     metroNIDAZOLE (FLAGYL) 500 MG tablet; Take 1 tablet (500 mg total) by mouth 3 (three) times daily for 10 days Do not drink alcohol while taking metronidazole.  Dispense: 30 tablet; Refill: 0  3. Nausea -     CBC w/auto Differential (5 Part) -     Urinalysis w/Microscopic -     ondansetron  (  ZOFRAN -ODT) 4 MG disintegrating tablet; Take 1 tablet (4 mg total) by mouth every 8 (eight) hours as needed for Nausea for up to 12 doses  Dispense: 12 tablet; Refill: 0   Will treat for diverticulitis.  Cipro , Flagyl, fluids.  Zofran  as needed.  Caution regarding sunburn or alcohol.  Call, return, ED if problems.    Plan:   Requested Prescriptions   Signed Prescriptions Disp Refills  . ciprofloxacin  HCl (CIPRO ) 500 MG tablet 20 tablet 0    Sig: Take 1 tablet (500 mg total) by mouth 2 (two) times daily for 10 days  . metroNIDAZOLE (FLAGYL) 500 MG tablet 30 tablet 0     Sig: Take 1 tablet (500 mg total) by mouth 3 (three) times daily for 10 days Do not drink alcohol while taking metronidazole.  . ondansetron  (ZOFRAN -ODT) 4 MG disintegrating tablet 12 tablet 0    Sig: Take 1 tablet (4 mg total) by mouth every 8 (eight) hours as needed for Nausea for up to 12 doses

## 2023-12-15 ENCOUNTER — Other Ambulatory Visit: Payer: Self-pay

## 2023-12-15 ENCOUNTER — Emergency Department

## 2023-12-15 ENCOUNTER — Observation Stay
Admission: EM | Admit: 2023-12-15 | Discharge: 2023-12-19 | Disposition: A | Discharge status: Home / Self Care | Attending: Obstetrics and Gynecology | Admitting: Obstetrics and Gynecology

## 2023-12-15 DIAGNOSIS — Z79899 Other long term (current) drug therapy: Secondary | ICD-10-CM | POA: Insufficient documentation

## 2023-12-15 DIAGNOSIS — E860 Dehydration: Secondary | ICD-10-CM | POA: Insufficient documentation

## 2023-12-15 DIAGNOSIS — K529 Noninfective gastroenteritis and colitis, unspecified: Secondary | ICD-10-CM | POA: Insufficient documentation

## 2023-12-15 DIAGNOSIS — F1090 Alcohol use, unspecified, uncomplicated: Secondary | ICD-10-CM | POA: Insufficient documentation

## 2023-12-15 DIAGNOSIS — Z87891 Personal history of nicotine dependence: Secondary | ICD-10-CM | POA: Insufficient documentation

## 2023-12-15 DIAGNOSIS — R7401 Elevation of levels of liver transaminase levels: Secondary | ICD-10-CM | POA: Insufficient documentation

## 2023-12-15 DIAGNOSIS — N179 Acute kidney failure, unspecified: Principal | ICD-10-CM | POA: Diagnosis present

## 2023-12-15 DIAGNOSIS — N200 Calculus of kidney: Secondary | ICD-10-CM | POA: Insufficient documentation

## 2023-12-15 DIAGNOSIS — K760 Fatty (change of) liver, not elsewhere classified: Secondary | ICD-10-CM | POA: Insufficient documentation

## 2023-12-15 DIAGNOSIS — M545 Low back pain, unspecified: Secondary | ICD-10-CM | POA: Insufficient documentation

## 2023-12-15 DIAGNOSIS — R1011 Right upper quadrant pain: Secondary | ICD-10-CM

## 2023-12-15 DIAGNOSIS — R7989 Other specified abnormal findings of blood chemistry: Secondary | ICD-10-CM

## 2023-12-15 DIAGNOSIS — I10 Essential (primary) hypertension: Secondary | ICD-10-CM | POA: Diagnosis present

## 2023-12-15 DIAGNOSIS — R197 Diarrhea, unspecified: Secondary | ICD-10-CM

## 2023-12-15 DIAGNOSIS — K219 Gastro-esophageal reflux disease without esophagitis: Secondary | ICD-10-CM | POA: Insufficient documentation

## 2023-12-15 DIAGNOSIS — E872 Acidosis, unspecified: Secondary | ICD-10-CM | POA: Insufficient documentation

## 2023-12-15 LAB — COMPREHENSIVE METABOLIC PANEL WITH GFR
ALT: 83 U/L — ABNORMAL HIGH (ref 0–44)
AST: 49 U/L — ABNORMAL HIGH (ref 15–41)
Albumin: 4.1 g/dL (ref 3.5–5.0)
Alkaline Phosphatase: 67 U/L (ref 38–126)
Anion gap: 12 (ref 5–15)
BUN: 31 mg/dL — ABNORMAL HIGH (ref 8–23)
CO2: 16 mmol/L — ABNORMAL LOW (ref 22–32)
Calcium: 9 mg/dL (ref 8.9–10.3)
Chloride: 108 mmol/L (ref 98–111)
Creatinine, Ser: 2.24 mg/dL — ABNORMAL HIGH (ref 0.61–1.24)
GFR, Estimated: 32 mL/min — ABNORMAL LOW (ref >60)
Glucose, Bld: 102 mg/dL — ABNORMAL HIGH (ref 70–99)
Potassium: 4 mmol/L (ref 3.5–5.1)
Sodium: 136 mmol/L (ref 135–145)
Total Bilirubin: 1.7 mg/dL — ABNORMAL HIGH (ref 0.0–1.2)
Total Protein: 7.4 g/dL (ref 6.5–8.1)

## 2023-12-15 LAB — CBC
HCT: 53.1 % — ABNORMAL HIGH (ref 39.0–52.0)
Hemoglobin: 18.6 g/dL — ABNORMAL HIGH (ref 13.0–17.0)
MCH: 33.6 pg (ref 26.0–34.0)
MCHC: 35 g/dL (ref 30.0–36.0)
MCV: 95.8 fL (ref 80.0–100.0)
Platelets: 286 K/uL (ref 150–400)
RBC: 5.54 MIL/uL (ref 4.22–5.81)
RDW: 12.6 % (ref 11.5–15.5)
WBC: 14.5 K/uL — ABNORMAL HIGH (ref 4.0–10.5)
nRBC: 0 % (ref 0.0–0.2)

## 2023-12-15 LAB — LIPASE, BLOOD: Lipase: 48 U/L (ref 11–51)

## 2023-12-15 MED ORDER — ONDANSETRON HCL 4 MG/2ML IJ SOLN
4.0000 mg | Freq: Once | INTRAMUSCULAR | Status: AC
  Administered 2023-12-15: 4 mg via INTRAVENOUS
  Filled 2023-12-15: qty 2

## 2023-12-15 MED ORDER — LACTATED RINGERS IV BOLUS
1000.0000 mL | Freq: Once | INTRAVENOUS | Status: AC
  Administered 2023-12-15: 1000 mL via INTRAVENOUS

## 2023-12-15 MED ORDER — MORPHINE SULFATE (PF) 2 MG/ML IV SOLN
2.0000 mg | Freq: Once | INTRAVENOUS | Status: AC
  Administered 2023-12-15: 2 mg via INTRAVENOUS
  Filled 2023-12-15: qty 1

## 2023-12-15 NOTE — ED Triage Notes (Signed)
 Pt dx with diverticulitis on Monday. Pt has been taking abx. Pt reports worsening sx to include RUQ pain, dizziness, diarrhea, and decreased urine output. Pt reports kidney pain x3 nights. Pt reports the RUQ pain is a burning sensation.

## 2023-12-15 NOTE — ED Provider Notes (Signed)
 [facility] Provider Note    Event Date/Time   First MD Initiated Contact with Patient 12/15/23 2259     (approximate)   History   Abdominal Pain   HPI  Jonathan Andrews. is a 68 y.o. male with history of GERD, lithiasis, hypertension, presenting with diarrhea and right upper quadrant abdominal pain.  States he started having having symptoms about a week ago, diarrhea is nonbloody, nonmelanotic.  Associated lightheadedness, nausea, dry heaving, mild headache.  No fever or chest pain, no shortness of breath or vision changes, no focal weakness or numbness.  States that he had a colonoscopy done in March that showed diverticulosis, went to see his primary care doctor 3 days ago and was started on antibiotics for possible diverticulitis.  No imaging was done.  States that he has been trying to keep himself hydrated, has had decreased urine output.  Also noted right lower back pain that he thought was similar to prior history of kidney stones.  He denies any dysuria or hematuria.  Denies prior history of cholecystectomy.  On independent chart review, he was seen by primary care on 3 November for his symptoms, was started on ciprofloxacin  and Flagyl.  Had a UA done at that time that showed trace ketones, negative for blood.     Physical Exam   Triage Vital Signs: ED Triage Vitals  Encounter Vitals Group     BP 12/15/23 1828 106/70     Girls Systolic BP Percentile --      Girls Diastolic BP Percentile --      Boys Systolic BP Percentile --      Boys Diastolic BP Percentile --      Pulse Rate 12/15/23 1828 (!) 52     Resp 12/15/23 1828 20     Temp 12/15/23 1828 97.9 F (36.6 C)     Temp Source 12/15/23 1828 Oral     SpO2 12/15/23 1828 93 %     Weight 12/15/23 1826 253 lb 15.5 oz (115.2 kg)     Height 12/15/23 1826 6' 4 (1.93 m)     Head Circumference --      Peak Flow --      Pain Score 12/15/23 1826 9     Pain Loc --      Pain Education --       Exclude from Growth Chart --     Most recent vital signs: Vitals:   12/15/23 2240 12/15/23 2245  BP: (!) 124/95   Pulse: 88 91  Resp: 18   Temp: 98.6 F (37 C)   SpO2: 98% 98%     General: Awake, no distress.  CV:  Good peripheral perfusion.  Resp:  Normal effort.  Abd:  No distention.  Soft, tender to the right upper quadrant, no guarding Other:  Mildly dry oral mucosa, no flank or CVA tenderness, he is mild tenderness to the right lower paralumbar region.   ED Results / Procedures / Treatments   Labs (all labs ordered are listed, but only abnormal results are displayed) Labs Reviewed  COMPREHENSIVE METABOLIC PANEL WITH GFR - Abnormal; Notable for the following components:      Result Value   CO2 16 (*)    Glucose, Bld 102 (*)    BUN 31 (*)    Creatinine, Ser 2.24 (*)    AST 49 (*)    ALT 83 (*)    Total Bilirubin 1.7 (*)    GFR, Estimated  32 (*)    All other components within normal limits  CBC - Abnormal; Notable for the following components:   WBC 14.5 (*)    Hemoglobin 18.6 (*)    HCT 53.1 (*)    All other components within normal limits  LIPASE, BLOOD  URINALYSIS, ROUTINE W REFLEX MICROSCOPIC     EKG  EKG shows, ***   RADIOLOGY On my independent interpretation, ***   PROCEDURES:  Critical Care performed: {CriticalCareYesNo:19197::Yes, see critical care procedure note(s),No}  Procedures   MEDICATIONS ORDERED IN ED: Medications - No data to display   IMPRESSION / MDM / ASSESSMENT AND PLAN / ED COURSE  I reviewed the triage vital signs and the nursing notes.                              Differential diagnosis includes, but is not limited to, ***  Patient's presentation is most consistent with acute presentation with potential threat to life or bodily function.  Independent interpretation of labs and imaging below. ***  The patient is on the cardiac monitor to evaluate for evidence of arrhythmia and/or significant heart rate  changes.       FINAL CLINICAL IMPRESSION(S) / ED DIAGNOSES   Final diagnoses:  None     Rx / DC Orders   ED Discharge Orders     None        Note:  This document was prepared using Dragon voice recognition software and may include unintentional dictation errors.

## 2023-12-16 ENCOUNTER — Encounter: Payer: Self-pay | Admitting: Internal Medicine

## 2023-12-16 ENCOUNTER — Emergency Department

## 2023-12-16 DIAGNOSIS — N179 Acute kidney failure, unspecified: Secondary | ICD-10-CM | POA: Diagnosis not present

## 2023-12-16 LAB — URINALYSIS, ROUTINE W REFLEX MICROSCOPIC
Bilirubin Urine: NEGATIVE
Glucose, UA: NEGATIVE mg/dL
Hgb urine dipstick: NEGATIVE
Ketones, ur: 5 mg/dL — AB
Nitrite: NEGATIVE
Protein, ur: 30 mg/dL — AB
Specific Gravity, Urine: 1.023 (ref 1.005–1.030)
pH: 5 (ref 5.0–8.0)

## 2023-12-16 LAB — GASTROINTESTINAL PANEL BY PCR, STOOL (REPLACES STOOL CULTURE)

## 2023-12-16 LAB — C DIFFICILE QUICK SCREEN W PCR REFLEX
C Diff antigen: NEGATIVE
C Diff interpretation: NOT DETECTED
C Diff toxin: NEGATIVE

## 2023-12-16 LAB — HIV ANTIBODY (ROUTINE TESTING W REFLEX): HIV Screen 4th Generation wRfx: NONREACTIVE

## 2023-12-16 MED ORDER — PANTOPRAZOLE SODIUM 20 MG PO TBEC
20.0000 mg | DELAYED_RELEASE_TABLET | Freq: Two times a day (BID) | ORAL | Status: DC
  Administered 2023-12-16 – 2023-12-19 (×6): 20 mg via ORAL
  Filled 2023-12-16 (×6): qty 1

## 2023-12-16 MED ORDER — KETOROLAC TROMETHAMINE 15 MG/ML IJ SOLN: 15.0000 mg | Freq: Four times a day (QID) | INTRAMUSCULAR | Status: DC | PRN

## 2023-12-16 MED ORDER — SODIUM CHLORIDE 0.9 % IV SOLN: 12.5000 mg | Freq: Four times a day (QID) | INTRAVENOUS | Status: DC | PRN

## 2023-12-16 MED ORDER — ONDANSETRON HCL 4 MG/2ML IJ SOLN: 4.0000 mg | Freq: Four times a day (QID) | INTRAMUSCULAR | Status: DC | PRN

## 2023-12-16 MED ORDER — MORPHINE SULFATE (PF) 2 MG/ML IV SOLN
2.0000 mg | INTRAVENOUS | Status: DC | PRN
Start: 2023-12-16 — End: 2023-12-19
  Administered 2023-12-19: 2 mg via INTRAVENOUS
  Filled 2023-12-16: qty 1

## 2023-12-16 MED ORDER — ENOXAPARIN SODIUM 60 MG/0.6ML IJ SOSY
0.5000 mg/kg | PREFILLED_SYRINGE | INTRAMUSCULAR | Status: DC
  Administered 2023-12-16 – 2023-12-19 (×4): 57.5 mg via SUBCUTANEOUS
  Filled 2023-12-16 (×4): qty 0.6

## 2023-12-16 MED ORDER — ACETAMINOPHEN 650 MG RE SUPP: 650.0000 mg | Freq: Four times a day (QID) | RECTAL | Status: DC | PRN

## 2023-12-16 MED ORDER — ACETAMINOPHEN 325 MG PO TABS: 650.0000 mg | ORAL_TABLET | Freq: Four times a day (QID) | ORAL | Status: DC | PRN

## 2023-12-16 MED ORDER — ONDANSETRON HCL 4 MG PO TABS: 4.0000 mg | ORAL_TABLET | Freq: Four times a day (QID) | ORAL | Status: DC | PRN

## 2023-12-16 MED ORDER — SODIUM CHLORIDE 0.9 % IV SOLN: INTRAVENOUS | Status: AC

## 2023-12-16 MED ORDER — OXYCODONE HCL 5 MG PO TABS: 5.0000 mg | ORAL_TABLET | ORAL | Status: DC | PRN

## 2023-12-16 NOTE — Progress Notes (Signed)
 Admitted to 2C-229. Enteric isolation precautions in place and education provided. A&O x 4. Oriented to the unit including fall safety and call-light use to which he stated understanding.

## 2023-12-16 NOTE — Progress Notes (Signed)
 Anticoagulation monitoring(Lovenox):  68 yo male ordered Lovenox 40 mg Q24h    Filed Weights   12/15/23 1826  Weight: 115.2 kg (253 lb 15.5 oz)   BMI 30.9   Lab Results  Component Value Date   CREATININE 2.24 (H) 12/15/2023   CREATININE 1.16 05/02/2023   CREATININE 1.14 08/15/2020   Estimated Creatinine Clearance: 45.1 mL/min (A) (by C-G formula based on SCr of 2.24 mg/dL (H)). Hemoglobin & Hematocrit     Component Value Date/Time   HGB 18.6 (H) 12/15/2023 1830   HGB 15.2 06/10/2012 2038   HCT 53.1 (H) 12/15/2023 1830   HCT 44.0 06/10/2012 2038     Per Protocol for Patient with estCrcl > 30 ml/min and BMI > 30, will transition to Lovenox 57.5 mg Q24h.

## 2023-12-16 NOTE — TOC CM/SW Note (Signed)
 Transition of Care (TOC) CM/SW Note    Transition of Care [facility]) - Inpatient Brief Assessment   Patient Details  Name: Jonathan Andrews. MRN: [id number] Date of Birth: Sep 03, 1955  Transition of Care [facility] CM/SW Contact:    Alfonso Rummer, LCSW Phone Number: 12/16/2023, 4:15 PM   Clinical Narrative:  KEN DELENA Rummer completed toc chart review. No toc needs identified. Please contact should needs arise.   Transition of Care Asessment: Insurance and Status: Insurance coverage has been reviewed Patient has primary care physician: Yes (HEDRICK, JAMES F) Home environment has been reviewed: single family home   Prior/Current Home Services: No current home services Social Drivers of Health Review: SDOH reviewed no interventions necessary Readmission risk has been reviewed: No Transition of care needs: no transition of care needs at this time

## 2023-12-16 NOTE — H&P (Signed)
 History and Physical    Patient: Jonathan Andrews. FMW:[id number] DOB: Mar 19, 1955 DOA: 12/15/2023 DOS: the patient was seen and examined on 12/16/2023 PCP: Jonathan Lynwood FALCON, MD  Patient coming from: Home  Chief Complaint: Abdominal pain, diarrhea Chief Complaint  Patient presents with   Abdominal Pain   HPI: Jonathan Andrews. is a 68 y.o. male with medical history significant of GERD, hypertension, bladder and kidney stones, diverticulosis who was otherwise well until about 5 to 7 days ago when he started having some abdominal pain and diarrhea and so followed up with his PCP and was prescribed Cipro  and Flagyl but according to patient his diarrhea have not improved and he was having associated weakness and lightheadedness and therefore came to the emergency room for further management.  According to patient his abdominal pain is of intensity 10/10 located in the right upper quadrant.  He has had about 8 watery stools within the last 24 hours.  He denies any blood in the stool or mucus.  Denies chest pain, vomiting, or urinary complaint. Upon arrival to the emergency room patient was found to have worsening renal function as well as dehydration and therefore hospitalist service was contacted to admit patient for further management.  Review of Systems: As mentioned in the history of present illness. All other systems reviewed and are negative. Past Medical History:  Diagnosis Date   Allergy    Bladder stones    GERD (gastroesophageal reflux disease)    Hemorrhoids    History of kidney stones    Hypertension    Past Surgical History:  Procedure Laterality Date   BIOPSY OF SKIN SUBCUTANEOUS TISSUE AND/OR MUCOUS MEMBRANE  05/19/2023   Procedure: BIOPSY, SKIN, SUBCUTANEOUS TISSUE, OR MUCOUS MEMBRANE;  Surgeon: Jonathan Andrews., MD;  Location: [facility];  Service: Gastroenterology;;   COLONOSCOPY N/A 08/15/2020   Procedure: COLONOSCOPY;  Surgeon: Jonathan Victory Andrews DOUGLAS, MD;   Location: [facility];  Service: Gastroenterology;  Laterality: N/A;   COLONOSCOPY N/A 05/19/2023   Procedure: COLONOSCOPY;  Surgeon: Jonathan Andrews., MD;  Location: [facility];  Service: Gastroenterology;  Laterality: N/A;   COLONOSCOPY WITH PROPOFOL  N/A 10/27/2020   Procedure: COLONOSCOPY WITH PROPOFOL ;  Surgeon: Jonathan Andrews., MD;  Location: [facility];  Service: Gastroenterology;  Laterality: N/A;   CYSTOSCOPY WITH LITHOLAPAXY N/A 03/27/2019   Procedure: CYSTOSCOPY WITH LITHOLAPAXY;  Surgeon: Jonathan Glendia BROCKS, MD;  Location: [facility];  Service: Urology;  Laterality: N/A;   ENDOSCOPIC MUCOSAL RESECTION N/A 10/27/2020   Procedure: ENDOSCOPIC MUCOSAL RESECTION;  Surgeon: Jonathan Andrews., MD;  Location: [facility];  Service: Gastroenterology;  Laterality: N/A;   EUS N/A 05/19/2023   Procedure: ULTRASOUND, UPPER GI TRACT, ENDOSCOPIC;  Surgeon: Jonathan Andrews., MD;  Location: [facility];  Service: Gastroenterology;  Laterality: N/A;   EVALUATION UNDER ANESTHESIA WITH HEMORRHOIDECTOMY N/A 04/17/2020   Procedure: HEMORRHOIDECTOMY;  Surgeon: Jonathan Leonor LITTIE, MD;  Location: [facility];  Service: General;  Laterality: N/A;   EXTRACORPOREAL SHOCK WAVE LITHOTRIPSY Left 08/02/2019   Procedure: EXTRACORPOREAL SHOCK WAVE LITHOTRIPSY (ESWL);  Surgeon: Jonathan Glendia BROCKS, MD;  Location: [facility];  Service: Urology;  Laterality: Left;   HAND SURGERY     HAND SURGERY Bilateral    HEMOSTASIS CLIP PLACEMENT  10/27/2020   Procedure: HEMOSTASIS CLIP PLACEMENT;  Surgeon: Jonathan Andrews., MD;  Location: [facility];  Service: Gastroenterology;;   POLYPECTOMY  05/19/2023   Procedure: POLYPECTOMY, INTESTINE;  Surgeon: Jonathan Andrews., MD;  Location: [facility];  Service: Gastroenterology;;   RECTAL EXAM UNDER ANESTHESIA  04/17/2020   Procedure: RECTAL EXAM UNDER ANESTHESIA;  Surgeon: Jonathan Leonor CROME, MD;  Location: [facility] OR;  Service: General;;   SUBMUCOSAL LIFTING INJECTION   10/27/2020   Procedure: SUBMUCOSAL LIFTING INJECTION;  Surgeon: Jonathan Aloha Raddle., MD;  Location: [facility];  Service: Gastroenterology;;   TENNIS ELBOW RELEASE/NIRSCHEL PROCEDURE Bilateral 04/14/2017   Procedure: TENNIS ELBOW RELEASE/NIRSCHEL PROCEDURE;  Surgeon: Jonathan Sharper, MD;  Location: [facility];  Service: Orthopedics;  Laterality: Bilateral;   TENNIS ELBOW RELEASE/NIRSCHEL PROCEDURE Right 01/03/2018   Procedure: TENNIS ELBOW RELEASE/NIRSCHEL PROCEDURE;  Surgeon: Jonathan Sharper, MD;  Location: [facility];  Service: Orthopedics;  Laterality: Right;   TONSILLECTOMY     UMBILICAL HERNIA REPAIR  08/22/2020   Procedure: HERNIA REPAIR UMBILICAL ADULT WITH MESH;  Surgeon: Jonathan Millet, DO;  Location: [facility];  Service: General;;   Social History:  reports that he quit smoking about 37 years ago. His smoking use included cigarettes. He started smoking about 49 years ago. He has a 24 pack-year smoking history. He has never used smokeless tobacco. He reports current alcohol use. He reports that he does not use drugs.  Allergies  Allergen Reactions   Penicillins Hives    Has patient had a PCN reaction causing immediate rash, facial/tongue/throat swelling, SOB or lightheadedness with hypotension: No Has patient had a PCN reaction causing severe rash involving mucus membranes or skin necrosis: No Has patient had a PCN reaction that required hospitalization: No Has patient had a PCN reaction occurring within the last 10 years: Unknown If all of the above answers are NO, then may proceed with Cephalosporin use.    Contrast Media [Iodinated Contrast Media] Other (See Comments)    migraine    Hydrocodone -Acetaminophen  Itching    No rash    Family History  Problem Relation Age of Onset   Prostate cancer Father    Bladder Cancer Neg Hx    Kidney cancer Neg Hx    Colon cancer Neg Hx    Esophageal cancer Neg Hx    Inflammatory bowel disease Neg Hx    Liver disease Neg Hx    Rectal cancer  Neg Hx    Stomach cancer Neg Hx    Pancreatic cancer Neg Hx     Prior to Admission medications   Medication Sig Start Date End Date Taking? Authorizing Provider  bisacodyl (DULCOLAX) 5 MG EC tablet Take 5 mg by mouth daily.   Yes [provider]  meclizine (ANTIVERT) 25 MG tablet Take 25 mg by mouth 3 (three) times daily as needed (vertigo).   Yes [provider]  Multiple Vitamin (MULTIVITAMIN WITH MINERALS) TABS tablet Take 1 tablet by mouth daily. Vita Pak 50 +   Yes [provider]  olmesartan (BENICAR) 20 MG tablet Take 20 mg by mouth daily.   Yes [provider]  ondansetron  (ZOFRAN -ODT) 4 MG disintegrating tablet Take 4 mg by mouth every 8 (eight) hours as needed for refractory nausea / vomiting. 12/12/23  Yes [provider]  pantoprazole  (PROTONIX ) 20 MG tablet Take 1 tablet (20 mg total) by mouth 2 (two) times daily. 05/31/23  Yes Mansouraty, Gabriel Jr., MD  albuterol (VENTOLIN HFA) 108 (90 Base) MCG/ACT inhaler Inhale 2 puffs into the lungs every 6 (six) hours as needed for shortness of breath. Patient not taking: Reported on 12/16/2023 05/02/23 05/01/24  [provider]  azelastine (ASTELIN) 0.1 % nasal spray Place 2 sprays into both nostrils daily as  needed (vertigo). Use in each nostril as directed Patient not taking: Reported on 12/16/2023    [provider]  ciprofloxacin  (CIPRO ) 500 MG tablet Take 500 mg by mouth 2 (two) times daily. Patient not taking: Reported on 12/16/2023 12/12/23 12/22/23  [provider]  metroNIDAZOLE (FLAGYL) 500 MG tablet Take 500 mg by mouth 3 (three) times daily. Patient not taking: Reported on 12/16/2023 12/12/23 12/22/23  [provider]  polycarbophil (FIBERCON) 625 MG tablet Take 1 tablet (625 mg total) by mouth daily. Patient not taking: Reported on 12/16/2023 04/17/20   Jonathan Leonor CROME, MD  promethazine -dextromethorphan (PROMETHAZINE -DM) 6.25-15 MG/5ML syrup Take 5 mLs by  mouth every 6 (six) hours as needed. Patient not taking: Reported on 12/16/2023 06/30/23   [provider]    Physical Exam: Vitals:   12/16/23 0430 12/16/23 0500 12/16/23 0626 12/16/23 0746  BP: 112/63 104/63 112/68 106/67  Pulse: 62 69 84 72  Resp:   18 16  Temp:   98.2 F (36.8 C) 98 F (36.7 C)  TempSrc:    Oral  SpO2: 99% 97% 100% 100%  Weight:      Height:       General exam: Elderly male laying in bed in no acute distress Respiratory system: Clear to auscultation bilaterally Cardiovascular system: S1-S2, RRR, no murmurs, no pedal edema Gastrointestinal system: Tenderness noted in the epigastric and right upper quadrant region Central nervous system: Alert and oriented. No focal neurological deficits. Extremities: Symmetric 5 x 5 power. Skin: No rashes, lesions or ulcers Psychiatry: Judgement and insight appear normal. Mood & affect appropriate.   Data Reviewed:  Abdominal ultrasound showed hepatic steatosis but no other acute abnormality CT scan of the abdomen showed colonic gas pattern consistent with diarrhea however no obstruction or ileus.  Also 7 mm right renal calculus seen  Assessment and Plan:  Acute kidney injury secondary to dehydration Presented with creatinine 2.2 however last known creatinine 7 months ago was 1.1  continue IV fluid Monitor renal function Avoid nephrotoxic medications Renally dose all drugs   Acute gastroenteritis Follow-up on GI panel as well as C. difficile test Continue IV fluid Advance diet as tolerated  GERD Continue PPI therapy  Essential hypertension Holding antihypertensives at this time on account of relative hypotension  Nonobstructive renal calculus Continue IV fluid    Advance Care Planning:   Code Status: Full Code full code  Consults: None  Family Communication: None present at bedside  Severity of Illness: The appropriate patient status for this patient is OBSERVATION. Observation status is  judged to be reasonable and necessary in order to provide the required intensity of service to ensure the patient's safety. The patient's presenting symptoms, physical exam findings, and initial radiographic and laboratory data in the context of their medical condition is felt to place them at decreased risk for further clinical deterioration. Furthermore, it is anticipated that the patient will be medically stable for discharge from the hospital within 2 midnights of admission.   Author: Drue ONEIDA Potter, MD 12/16/2023 8:50 AM  For on call review [email or web address].

## 2023-12-16 NOTE — ED Notes (Signed)
 Pt ambulated to the toilet and provided urine and stool samples.  Provided broth and ginger ale for pt on a clear liquid diet.

## 2023-12-17 DIAGNOSIS — N179 Acute kidney failure, unspecified: Secondary | ICD-10-CM | POA: Diagnosis not present

## 2023-12-17 LAB — CBC WITH DIFFERENTIAL/PLATELET
Abs Immature Granulocytes: 0.04 K/uL (ref 0.00–0.07)
Basophils Absolute: 0 K/uL (ref 0.0–0.1)
Basophils Relative: 0 %
Eosinophils Absolute: 0 K/uL (ref 0.0–0.5)
Eosinophils Relative: 0 %
HCT: 45 % (ref 39.0–52.0)
Hemoglobin: 15.6 g/dL (ref 13.0–17.0)
Immature Granulocytes: 0 %
Lymphocytes Relative: 13 %
Lymphs Abs: 1.3 K/uL (ref 0.7–4.0)
MCH: 33.5 pg (ref 26.0–34.0)
MCHC: 34.7 g/dL (ref 30.0–36.0)
MCV: 96.8 fL (ref 80.0–100.0)
Monocytes Absolute: 0.8 K/uL (ref 0.1–1.0)
Monocytes Relative: 8 %
Neutro Abs: 8.1 K/uL — ABNORMAL HIGH (ref 1.7–7.7)
Neutrophils Relative %: 79 %
Platelets: 237 K/uL (ref 150–400)
RBC: 4.65 MIL/uL (ref 4.22–5.81)
RDW: 13.1 % (ref 11.5–15.5)
WBC: 10.3 K/uL (ref 4.0–10.5)
nRBC: 0 % (ref 0.0–0.2)

## 2023-12-17 LAB — BASIC METABOLIC PANEL WITH GFR
Anion gap: 7 (ref 5–15)
BUN: 30 mg/dL — ABNORMAL HIGH (ref 8–23)
CO2: 14 mmol/L — ABNORMAL LOW (ref 22–32)
Calcium: 8.1 mg/dL — ABNORMAL LOW (ref 8.9–10.3)
Chloride: 118 mmol/L — ABNORMAL HIGH (ref 98–111)
Creatinine, Ser: 1.64 mg/dL — ABNORMAL HIGH (ref 0.61–1.24)
GFR, Estimated: 46 mL/min — ABNORMAL LOW (ref >60)
Glucose, Bld: 95 mg/dL (ref 70–99)
Potassium: 4 mmol/L (ref 3.5–5.1)
Sodium: 139 mmol/L (ref 135–145)

## 2023-12-17 MED ORDER — LOPERAMIDE HCL 2 MG PO CAPS
4.0000 mg | ORAL_CAPSULE | ORAL | Status: AC | PRN
  Administered 2023-12-17 (×2): 4 mg via ORAL
  Filled 2023-12-17 (×2): qty 2

## 2023-12-17 MED ORDER — SODIUM CHLORIDE 0.9 % IV SOLN: INTRAVENOUS | Status: AC

## 2023-12-17 NOTE — Progress Notes (Signed)
 Progress Note   Patient: Jonathan Andrews. FMW:[id number] DOB: 10/11/1955 DOA: 12/15/2023     0 DOS: the patient was seen and examined on 12/17/2023   Brief hospital course:  Jonathan Andrews. is a 68 y.o. male with medical history significant of GERD, hypertension, bladder and kidney stones, diverticulosis who was otherwise well until about 5 to 7 days prior to presentation when he started having some abdominal pain and diarrhea and so followed up with his PCP and was prescribed Cipro  and Flagyl but according to patient his diarrhea have not improved and he was having associated weakness and lightheadedness and therefore came to the emergency room for further management.  According to patient his abdominal pain is of intensity 10/10 located in the right upper quadrant.  He has had about 8 watery stools within the last 24 hours.  He denies any blood in the stool or mucus.  Denies chest pain, vomiting, or urinary complaint. Upon arrival to the emergency room patient was found to have worsening renal function as well as dehydration and therefore hospitalist service was contacted to admit patient for further management.  Assessment and Plan:   Acute kidney injury secondary to dehydration-improving Presented with creatinine 2.2 however last known creatinine 7 months ago was 1.1  continue IV fluid Monitor renal function Avoid nephrotoxic medications Renally dose all drugs     Acute gastroenteritis likely viral in origin GI panel as well as C. difficile test all negative Continue IV fluid Advance diet as tolerated   GERD Continue PPI therapy   Essential hypertension Holding antihypertensives at this time on account of relative hypotension   Nonobstructive renal calculus Continue IV fluid      Advance Care Planning:   Code Status: Full Code full code   Consults: None   Family Communication: None present at bedside     Subjective:  Patient seen and examined in the presence of  family Denies nausea vomiting, chest pain cough Abdominal pain improving Still having diarrhea at least has passed about 8 watery stool from overnight now. Patient still requiring IV fluid Renal function improving  Physical Exam: General exam: Elderly male laying in bed in no acute distress Respiratory system: Clear to auscultation bilaterally Cardiovascular system: S1-S2, RRR, no murmurs, no pedal edema Gastrointestinal system: Tenderness noted in the epigastric and right upper quadrant region Central nervous system: Alert and oriented. No focal neurological deficits. Extremities: Symmetric 5 x 5 power. Skin: No rashes, lesions or ulcers Psychiatry: Judgement and insight appear normal. Mood & affect appropriate.  Vitals:   12/16/23 1925 12/17/23 0505 12/17/23 0743 12/17/23 1539  BP: 110/68 132/77 131/79 (!) 140/78  Pulse: 86 74 81 74  Resp: 16 16 16 14   Temp:  98 F (36.7 C) 97.7 F (36.5 C) 98.2 F (36.8 C)  TempSrc: Oral     SpO2: 99% 95% 96% 100%  Weight:      Height:          Latest Ref Rng & Units 12/17/2023    6:02 AM 12/15/2023    6:30 PM 05/02/2023   10:57 AM  CBC  WBC 4.0 - 10.5 K/uL 10.3  14.5  5.2   Hemoglobin 13.0 - 17.0 g/dL 84.3  81.3  83.3   Hematocrit 39.0 - 52.0 % 45.0  53.1  49.1   Platelets 150 - 400 K/uL 237  286  177.0        Latest Ref Rng & Units 12/17/2023    6:02  AM 12/15/2023    6:30 PM 05/02/2023   10:57 AM  BMP  Glucose 70 - 99 mg/dL 95  897  84   BUN 8 - 23 mg/dL 30  31  17    Creatinine 0.61 - 1.24 mg/dL 8.35  7.75  8.83   Sodium 135 - 145 mmol/L 139  136  140   Potassium 3.5 - 5.1 mmol/L 4.0  4.0  4.2   Chloride 98 - 111 mmol/L 118  108  104   CO2 22 - 32 mmol/L 14  16  29    Calcium 8.9 - 10.3 mg/dL 8.1  9.0  9.9       Author: Drue ONEIDA Potter, MD 12/17/2023 4:19 PM  For on call review [email or web address].

## 2023-12-18 DIAGNOSIS — N179 Acute kidney failure, unspecified: Secondary | ICD-10-CM | POA: Diagnosis not present

## 2023-12-18 LAB — CBC WITH DIFFERENTIAL/PLATELET
Abs Immature Granulocytes: 0.04 K/uL (ref 0.00–0.07)
Basophils Absolute: 0 K/uL (ref 0.0–0.1)
Basophils Relative: 0 %
Eosinophils Absolute: 0.1 K/uL (ref 0.0–0.5)
Eosinophils Relative: 1 %
HCT: 45.5 % (ref 39.0–52.0)
Hemoglobin: 15.1 g/dL (ref 13.0–17.0)
Immature Granulocytes: 0 %
Lymphocytes Relative: 17 %
Lymphs Abs: 1.5 K/uL (ref 0.7–4.0)
MCH: 33 pg (ref 26.0–34.0)
MCHC: 33.2 g/dL (ref 30.0–36.0)
MCV: 99.6 fL (ref 80.0–100.0)
Monocytes Absolute: 0.7 K/uL (ref 0.1–1.0)
Monocytes Relative: 8 %
Neutro Abs: 6.6 K/uL (ref 1.7–7.7)
Neutrophils Relative %: 74 %
Platelets: 237 K/uL (ref 150–400)
RBC: 4.57 MIL/uL (ref 4.22–5.81)
RDW: 13.2 % (ref 11.5–15.5)
WBC: 9 K/uL (ref 4.0–10.5)
nRBC: 0 % (ref 0.0–0.2)

## 2023-12-18 LAB — BASIC METABOLIC PANEL WITH GFR
Anion gap: 4 — ABNORMAL LOW (ref 5–15)
BUN: 20 mg/dL (ref 8–23)
CO2: 16 mmol/L — ABNORMAL LOW (ref 22–32)
Calcium: 8.1 mg/dL — ABNORMAL LOW (ref 8.9–10.3)
Chloride: 119 mmol/L — ABNORMAL HIGH (ref 98–111)
Creatinine, Ser: 1.31 mg/dL — ABNORMAL HIGH (ref 0.61–1.24)
GFR, Estimated: >60 mL/min (ref >60)
Glucose, Bld: 88 mg/dL (ref 70–99)
Potassium: 3.8 mmol/L (ref 3.5–5.1)
Sodium: 139 mmol/L (ref 135–145)

## 2023-12-18 LAB — GLUCOSE, CAPILLARY: Glucose-Capillary: 106 mg/dL — ABNORMAL HIGH (ref 70–99)

## 2023-12-18 MED ORDER — LOPERAMIDE HCL 2 MG PO CAPS
4.0000 mg | ORAL_CAPSULE | ORAL | Status: AC | PRN
  Administered 2023-12-18 (×2): 4 mg via ORAL
  Filled 2023-12-18 (×2): qty 2

## 2023-12-18 NOTE — Plan of Care (Signed)
   Problem: Elimination: Goal: Will not experience complications related to bowel motility Outcome: Progressing   Problem: Safety: Goal: Ability to remain free from injury will improve Outcome: Progressing

## 2023-12-18 NOTE — Progress Notes (Signed)
 Progress Note   Patient: Jonathan Andrews. FMW:[id number] DOB: February 17, 1955 DOA: 12/15/2023     0 DOS: the patient was seen and examined on 12/18/2023    Brief hospital course:  Jonathan Andrews. is a 68 y.o. male with medical history significant of GERD, hypertension, bladder and kidney stones, diverticulosis who was otherwise well until about 5 to 7 days prior to presentation when he started having some abdominal pain and diarrhea and so followed up with his PCP and was prescribed Cipro  and Flagyl but according to patient his diarrhea have not improved and he was having associated weakness and lightheadedness and therefore came to the emergency room for further management.  According to patient his abdominal pain is of intensity 10/10 located in the right upper quadrant.  He has had about 8 watery stools within the last 24 hours.  He denies any blood in the stool or mucus.  Denies chest pain, vomiting, or urinary complaint. Upon arrival to the emergency room patient was found to have worsening renal function as well as dehydration and therefore hospitalist service was contacted to admit patient for further management.   Assessment and Plan:    Acute kidney injury secondary to dehydration-improving Presented with creatinine 2.2 however last known creatinine 7 months ago was 1.1  continue IV fluid Monitor renal function Avoid nephrotoxic medications Renally dose all drugs     Acute gastroenteritis likely viral in origin GI panel as well as C. difficile test all negative Continue IV fluid Advance diet as tolerated   GERD Continue PPI therapy   Essential hypertension Holding antihypertensives at this time on account of relative hypotension   Nonobstructive renal calculus Continue IV fluid      Advance Care Planning:   Code Status: Full Code full code   Consults: None   Family Communication: None present at bedside       Subjective:  Patient seen and examined in the  presence of family Denies nausea vomiting, chest pain cough Abdominal pain improving Diarrhea slightly better but still watery Patient still requiring IV fluid Renal function improving   Physical Exam: General exam: Elderly male laying in bed in no acute distress Respiratory system: Clear to auscultation bilaterally Cardiovascular system: S1-S2, RRR, no murmurs, no pedal edema Gastrointestinal system: Tenderness noted in the epigastric and right upper quadrant region Central nervous system: Alert and oriented. No focal neurological deficits. Extremities: Symmetric 5 x 5 power. Skin: No rashes, lesions or ulcers Psychiatry: Judgement and insight appear normal. Mood & affect appropriate.   Vitals:   12/17/23 0743 12/17/23 1539 12/17/23 2106 12/18/23 0353  BP: 131/79 (!) 140/78 (!) 156/84 120/65  Pulse: 81 74 80 74  Resp: 16 14 16    Temp: 97.7 F (36.5 C) 98.2 F (36.8 C) 98.2 F (36.8 C)   TempSrc:   Oral   SpO2: 96% 100% 100% 99%  Weight:      Height:          Latest Ref Rng & Units 12/18/2023    5:21 AM 12/17/2023    6:02 AM 12/15/2023    6:30 PM  CBC  WBC 4.0 - 10.5 K/uL 9.0  10.3  14.5   Hemoglobin 13.0 - 17.0 g/dL 84.8  84.3  81.3   Hematocrit 39.0 - 52.0 % 45.5  45.0  53.1   Platelets 150 - 400 K/uL 237  237  286        Latest Ref Rng & Units 12/18/2023  5:21 AM 12/17/2023    6:02 AM 12/15/2023    6:30 PM  BMP  Glucose 70 - 99 mg/dL 88  95  897   BUN 8 - 23 mg/dL 20  30  31    Creatinine 0.61 - 1.24 mg/dL 8.68  8.35  7.75   Sodium 135 - 145 mmol/L 139  139  136   Potassium 3.5 - 5.1 mmol/L 3.8  4.0  4.0   Chloride 98 - 111 mmol/L 119  118  108   CO2 22 - 32 mmol/L 16  14  16    Calcium 8.9 - 10.3 mg/dL 8.1  8.1  9.0      Author: Drue ONEIDA Potter, MD 12/18/2023 4:21 PM  For on call review [email or web address].

## 2023-12-18 NOTE — Care Management Obs Status (Signed)
 MEDICARE OBSERVATION STATUS NOTIFICATION   Patient Details  Name: Jonathan Andrews. MRN: [id number] Date of Birth: 17-Oct-1955   Medicare Observation Status Notification Given:  Yes    [name] 12/18/2023, 5:03 PM

## 2023-12-19 DIAGNOSIS — N179 Acute kidney failure, unspecified: Secondary | ICD-10-CM | POA: Diagnosis not present

## 2023-12-19 DIAGNOSIS — E872 Acidosis, unspecified: Secondary | ICD-10-CM | POA: Insufficient documentation

## 2023-12-19 DIAGNOSIS — R197 Diarrhea, unspecified: Secondary | ICD-10-CM

## 2023-12-19 LAB — CBC WITH DIFFERENTIAL/PLATELET
Abs Immature Granulocytes: 0.03 K/uL (ref 0.00–0.07)
Basophils Absolute: 0 K/uL (ref 0.0–0.1)
Basophils Relative: 0 %
Eosinophils Absolute: 0.1 K/uL (ref 0.0–0.5)
Eosinophils Relative: 1 %
HCT: 42.4 % (ref 39.0–52.0)
Hemoglobin: 14.7 g/dL (ref 13.0–17.0)
Immature Granulocytes: 0 %
Lymphocytes Relative: 20 %
Lymphs Abs: 1.4 K/uL (ref 0.7–4.0)
MCH: 33.6 pg (ref 26.0–34.0)
MCHC: 34.7 g/dL (ref 30.0–36.0)
MCV: 97 fL (ref 80.0–100.0)
Monocytes Absolute: 0.7 K/uL (ref 0.1–1.0)
Monocytes Relative: 9 %
Neutro Abs: 5 K/uL (ref 1.7–7.7)
Neutrophils Relative %: 70 %
Platelets: 214 K/uL (ref 150–400)
RBC: 4.37 MIL/uL (ref 4.22–5.81)
RDW: 13 % (ref 11.5–15.5)
WBC: 7.1 K/uL (ref 4.0–10.5)
nRBC: 0 % (ref 0.0–0.2)

## 2023-12-19 LAB — HEPATIC FUNCTION PANEL
ALT: 104 U/L — ABNORMAL HIGH (ref 0–44)
AST: 78 U/L — ABNORMAL HIGH (ref 15–41)
Albumin: 2.9 g/dL — ABNORMAL LOW (ref 3.5–5.0)
Alkaline Phosphatase: 59 U/L (ref 38–126)
Bilirubin, Direct: 0.2 mg/dL (ref 0.0–0.2)
Indirect Bilirubin: 0.8 mg/dL (ref 0.3–0.9)
Total Bilirubin: 1 mg/dL (ref 0.0–1.2)
Total Protein: 5.3 g/dL — ABNORMAL LOW (ref 6.5–8.1)

## 2023-12-19 LAB — BASIC METABOLIC PANEL WITH GFR
Anion gap: 11 (ref 5–15)
BUN: 15 mg/dL (ref 8–23)
CO2: 14 mmol/L — ABNORMAL LOW (ref 22–32)
Calcium: 8.4 mg/dL — ABNORMAL LOW (ref 8.9–10.3)
Chloride: 115 mmol/L — ABNORMAL HIGH (ref 98–111)
Creatinine, Ser: 1.27 mg/dL — ABNORMAL HIGH (ref 0.61–1.24)
GFR, Estimated: >60 mL/min (ref >60)
Glucose, Bld: 90 mg/dL (ref 70–99)
Potassium: 3.7 mmol/L (ref 3.5–5.1)
Sodium: 140 mmol/L (ref 135–145)

## 2023-12-19 MED ORDER — TAMSULOSIN HCL 0.4 MG PO CAPS: ORAL_CAPSULE | ORAL | 0 refills | Status: AC

## 2023-12-19 MED ORDER — LOPERAMIDE HCL 2 MG PO CAPS: 2.0000 mg | ORAL_CAPSULE | ORAL | 1 refills | Status: DC | PRN

## 2023-12-19 MED ORDER — SODIUM BICARBONATE 650 MG PO TABS: 650.0000 mg | ORAL_TABLET | Freq: Two times a day (BID) | ORAL | 0 refills | Status: DC

## 2023-12-19 NOTE — Plan of Care (Signed)
   Problem: Elimination: Goal: Will not experience complications related to bowel motility Outcome: Progressing   Problem: Safety: Goal: Ability to remain free from injury will improve Outcome: Progressing

## 2023-12-19 NOTE — Discharge Summary (Signed)
 [employer]. FMW:[id number] DOB: 07/25/1955 DOA: 12/15/2023  PCP: Jonathan Lynwood FALCON, MD  Admit date: 12/15/2023 Discharge date: 12/19/2023  Time spent: 35 minutes  Recommendations for Outpatient Follow-up:  Close pcp f/u, check BMP then, determine whether to discontinue sodium bicarb, also whether to re-start BP med    Discharge Diagnoses:  Principal Problem:   AKI (acute kidney injury) Active Problems:   Essential hypertension   Diarrhea   Metabolic acidosis   Discharge Condition: improving  Diet recommendation: regular  Filed Weights   12/15/23 1826  Weight: 115.2 kg    History of present illness:  From admission h and p Jonathan Andrews. is a 68 y.o. male with medical history significant of GERD, hypertension, bladder and kidney stones, diverticulosis who was otherwise well until about 5 to 7 days ago when he started having some abdominal pain and diarrhea and so followed up with his PCP and was prescribed Cipro  and Flagyl but according to patient his diarrhea have not improved and he was having associated weakness and lightheadedness and therefore came to the emergency room for further management.  According to patient his abdominal pain is of intensity 10/10 located in the right upper quadrant.  He has had about 8 watery stools within the last 24 hours.  He denies any blood in the stool or mucus.  Denies chest pain, vomiting, or urinary complaint. Upon arrival to the emergency room patient was found to have worsening renal function as well as dehydration and therefore hospitalist service was contacted to admit patient for further management.    Hospital Course:   Patient presents with diarrhea. CT consistent with diarrhea, no other acute findings. Labs remarkable for mild transaminitis - CT and ruq u/s consistent with steatosis. GI pathogen and c diff testing both negative. Patient admitted for aki, also had metabolic acidosis presumably from the diarrhea.  Symptomatically he is much improved, only one watery BM thus far today, tolerating PO, no abdominal pain. CT did show non-obstructive right renal calculus and patient has a history of symptomatic nephrolithiasis, he requests a prn med to facilitate stone passage should he develop symptoamtic cholelithiasis, so will rx flomax . From an AKI standpoint kidney function is now close to baseline though acidosis continues (patient did not receive bicarb supplementation here) - he will be discharged with bicar supplementation and have advised close pcp f/u within a week to check kidney function and assess acid base status. Advised to push PO fluids in the interim; return precautions have been reviewed. D/c plan reviewed with wife present, both she and patient are agreeable to the plan.    Procedures: none   Consultations: none  Discharge Exam: Vitals:   12/19/23 0740 12/19/23 1444  BP: 116/63 (!) 143/95  Pulse: 60 66  Resp: 16 16  Temp: 98.5 F (36.9 C) 98 F (36.7 C)  SpO2: 98% 99%    General: NAD Cardiovascular: RRR Respiratory: CTAB Abdomen: obese, non-tender  Discharge Instructions   Discharge Instructions     Diet - low sodium heart healthy   Complete by: As directed    Increase activity slowly   Complete by: As directed       Allergies as of 12/19/2023       Reactions   Penicillins Hives   Has patient had a PCN reaction causing immediate rash, facial/tongue/throat swelling, SOB or lightheadedness with hypotension: No Has patient had a PCN reaction causing severe rash involving mucus membranes or skin necrosis: No Has patient had  a PCN reaction that required hospitalization: No Has patient had a PCN reaction occurring within the last 10 years: Unknown If all of the above answers are NO, then may proceed with Cephalosporin use.   Contrast Media [iodinated Contrast Media] Other (See Comments)   migraine    Hydrocodone -acetaminophen  Itching   No rash         Medication List     STOP taking these medications    albuterol 108 (90 Base) MCG/ACT inhaler Commonly known as: VENTOLIN HFA   azelastine 0.1 % nasal spray Commonly known as: ASTELIN   bisacodyl 5 MG EC tablet Commonly known as: DULCOLAX   ciprofloxacin  500 MG tablet Commonly known as: CIPRO    FiberCon 625 MG tablet Generic drug: polycarbophil   metroNIDAZOLE 500 MG tablet Commonly known as: FLAGYL   olmesartan 20 MG tablet Commonly known as: BENICAR   promethazine -dextromethorphan 6.25-15 MG/5ML syrup Commonly known as: PROMETHAZINE -DM       TAKE these medications    loperamide 2 MG capsule Commonly known as: IMODIUM Take 1 capsule (2 mg total) by mouth as needed for diarrhea or loose stools.   meclizine 25 MG tablet Commonly known as: ANTIVERT Take 25 mg by mouth 3 (three) times daily as needed (vertigo).   multivitamin with minerals Tabs tablet Take 1 tablet by mouth daily. Vita Pak 50 +   ondansetron  4 MG disintegrating tablet Commonly known as: ZOFRAN -ODT Take 4 mg by mouth every 8 (eight) hours as needed for refractory nausea / vomiting.   pantoprazole  20 MG tablet Commonly known as: Protonix  Take 1 tablet (20 mg total) by mouth 2 (two) times daily.   sodium bicarbonate 650 MG tablet Take 1 tablet (650 mg total) by mouth 2 (two) times daily.   tamsulosin  0.4 MG Caps capsule Commonly known as: FLOMAX  Daily as needed to facilitate kidney stone passage       Allergies  Allergen Reactions   Penicillins Hives    Has patient had a PCN reaction causing immediate rash, facial/tongue/throat swelling, SOB or lightheadedness with hypotension: No Has patient had a PCN reaction causing severe rash involving mucus membranes or skin necrosis: No Has patient had a PCN reaction that required hospitalization: No Has patient had a PCN reaction occurring within the last 10 years: Unknown If all of the above answers are NO, then may proceed with  Cephalosporin use.    Contrast Media [Iodinated Contrast Media] Other (See Comments)    migraine    Hydrocodone -Acetaminophen  Itching    No rash    Follow-up Information     Jonathan Lynwood FALCON, MD Follow up.   Specialty: Family Medicine Why: within one week Contact information: [address] [facility] [place] [address] [phone]                  The results of significant diagnostics from this hospitalization (including imaging, microbiology, ancillary and laboratory) are listed below for reference.    Significant Diagnostic Studies: US  ABDOMEN LIMITED RUQ (LIVER/GB) Result Date: 12/16/2023 EXAM: Right Upper Quadrant Abdominal Ultrasound 12/16/2023 12:50:00 AM TECHNIQUE: Real-time ultrasonography of the right upper quadrant of the abdomen was performed. COMPARISON: Comparison with CT 12/15/2023. CLINICAL HISTORY: RUQ pain. FINDINGS: LIVER: The liver demonstrates hepatic steatosis. Patent portal vein with antegrade flow. No intrahepatic biliary ductal dilatation. No evidence of mass. BILIARY SYSTEM: No pericholecystic fluid or wall thickening. No cholelithiasis. Negative sonographic Murphy's sign. Common bile duct measures 4 mm. OTHER: No right upper quadrant ascites. IMPRESSION:  1. Hepatic steatosis. 2. No acute abnormality. Electronically signed by: Norman Gatlin MD 12/16/2023 01:25 AM EST RP Workstation: [id number]   CT ABDOMEN PELVIS WO CONTRAST Result Date: 12/15/2023 CLINICAL DATA:  Diverticulitis, persistent abdominal pain, diarrhea EXAM: CT ABDOMEN AND PELVIS WITHOUT CONTRAST TECHNIQUE: Multidetector CT imaging of the abdomen and pelvis was performed following the standard protocol without IV contrast. RADIATION DOSE REDUCTION: This exam was performed according to the departmental dose-optimization program which includes automated exposure control, adjustment of the mA and/or kV according to patient size and/or use of iterative reconstruction technique.  COMPARISON:  08/15/2020 FINDINGS: Lower chest: No acute pleural or parenchymal lung disease. Hepatobiliary: Unremarkable unenhanced appearance of the liver and gallbladder. No biliary duct dilation. Pancreas: Unremarkable unenhanced appearance. Spleen: Unremarkable unenhanced appearance. Adrenals/Urinary Tract: Bilateral renal cortical cysts do not require specific imaging follow-up. Nonobstructing 7 mm calculus lower pole right kidney. No left-sided calculi. No obstructive uropathy within either kidney. The adrenals and bladder are unremarkable. Stomach/Bowel: No bowel obstruction or ileus. Scattered gas fluid levels throughout the colon consistent with diarrhea or recent laxative/enema use. Normal appendix right lower quadrant. No bowel wall thickening or inflammatory change. Vascular/Lymphatic: Aortic atherosclerosis. No enlarged abdominal or pelvic lymph nodes. Reproductive: Prostate is unremarkable. Other: No free fluid or free intraperitoneal gas. No abdominal wall hernia. Musculoskeletal: No acute or destructive bony abnormalities. Reconstructed images demonstrate no additional findings. IMPRESSION: 1. Colonic gas fluid levels consistent with diarrhea or recent laxative/enema use. No obstruction or ileus. 2. Nonobstructing 7 mm right renal calculus. 3.  Aortic Atherosclerosis (ICD10-I70.0). Electronically Signed   By: Ozell Daring M.D.   On: 12/15/2023 23:54   CT CHEST W CONTRAST Result Date: 12/04/2023 CLINICAL DATA:  Esophageal mass * Tracking Code: BO * EXAM: CT CHEST WITH CONTRAST TECHNIQUE: Multidetector CT imaging of the chest was performed during intravenous contrast administration. RADIATION DOSE REDUCTION: This exam was performed according to the departmental dose-optimization program which includes automated exposure control, adjustment of the mA and/or kV according to patient size and/or use of iterative reconstruction technique. CONTRAST:  OMNIPAQUE  IOHEXOL  300 MG/ML  SOLN COMPARISON:   05/03/2023 FINDINGS: Cardiovascular: No significant vascular findings. Normal heart size. Enlargement of the main pulmonary artery measuring up to 3.7 cm in caliber. No pericardial effusion. Mediastinum/Nodes: No definite enlarged mediastinal, hilar, or axillary lymph nodes. 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 Thyroid gland and trachea demonstrate no significant findings. Lungs/Pleura: Mild, bandlike scarring of the bilateral lung bases. No pleural effusion or pneumothorax. Upper Abdomen: No acute abnormality.  Hepatic steatosis. Musculoskeletal: No chest wall abnormality. No acute osseous findings. IMPRESSION: 1. Unchanged partially concentric, heterogeneously calcified mass about the left aspect of the upper esophagus, just above the level of the aortic arch, partially compressing the lumen and measuring 3.0 x 2.8 x 1.7 cm. This is of uncertain nature, differential considerations as previously discussed, generally including calcified esophageal leiomyoma, mucosal lymphovenous malformation, etc. Calcification and imaging stability generally suggest a benign etiology. 2. No evidence of lymphadenopathy or metastatic disease in the chest. 3. Enlargement of the main pulmonary artery, as can be seen in pulmonary hypertension. 4. Hepatic steatosis. Aortic Atherosclerosis (ICD10-I70.0). Electronically Signed   By: Marolyn JONETTA Jaksch M.D.   On: 12/04/2023 13:38    Microbiology: Recent Results (from the past 240  hours)  Gastrointestinal Panel by PCR , Stool     Status: None   Collection Time: 12/16/23  5:37 AM   Specimen: Stool  Result Value Ref Range Status   Campylobacter species NOT DETECTED NOT DETECTED Final   Plesimonas shigelloides NOT DETECTED NOT DETECTED Final   Salmonella species NOT  DETECTED NOT DETECTED Final   Yersinia enterocolitica NOT DETECTED NOT DETECTED Final   Vibrio species NOT DETECTED NOT DETECTED Final   Vibrio cholerae NOT DETECTED NOT DETECTED Final   Enteroaggregative E coli (EAEC) NOT DETECTED NOT DETECTED Final   Enteropathogenic E coli (EPEC) NOT DETECTED NOT DETECTED Final   Enterotoxigenic E coli (ETEC) NOT DETECTED NOT DETECTED Final   Shiga like toxin producing E coli (STEC) NOT DETECTED NOT DETECTED Final   Shigella/Enteroinvasive E coli (EIEC) NOT DETECTED NOT DETECTED Final   Cryptosporidium NOT DETECTED NOT DETECTED Final   Cyclospora cayetanensis NOT DETECTED NOT DETECTED Final   Entamoeba histolytica NOT DETECTED NOT DETECTED Final   Giardia lamblia NOT DETECTED NOT DETECTED Final   Adenovirus F40/41 NOT DETECTED NOT DETECTED Final   Astrovirus NOT DETECTED NOT DETECTED Final   Norovirus GI/GII NOT DETECTED NOT DETECTED Final   Rotavirus A NOT DETECTED NOT DETECTED Final   Sapovirus (I, II, IV, and V) NOT DETECTED NOT DETECTED Final    Comment: Performed at [facility], [address] [place], [place] [address]  C Difficile Quick Screen w PCR reflex     Status: None   Collection Time: 12/16/23  5:37 AM   Specimen: STOOL  Result Value Ref Range Status   C Diff antigen NEGATIVE NEGATIVE Final   C Diff toxin NEGATIVE NEGATIVE Final   C Diff interpretation No C. difficile detected.  Final    Comment: Performed at [facility], [address] [place], [place] [address]     Labs: Basic Metabolic Panel: Recent Labs  Lab 12/15/23 1830 12/17/23 0602 12/18/23 0521 12/19/23 0625  NA 136 139 139 140  K 4.0 4.0 3.8 3.7  CL 108 118* 119* 115*  CO2 16* 14* 16* 14*  GLUCOSE 102* 95 88 90  BUN 31* 30* 20 15  CREATININE 2.24* 1.64* 1.31* 1.27*  CALCIUM 9.0 8.1* 8.1* 8.4*   Liver Function Tests: Recent Labs  Lab 12/15/23 1830  AST 49*  ALT 83*  ALKPHOS 67  BILITOT 1.7*  PROT 7.4  ALBUMIN 4.1   Recent Labs   Lab 12/15/23 1830  LIPASE 48   No results for input(s): AMMONIA in the last 168 hours. CBC: Recent Labs  Lab 12/15/23 1830 12/17/23 0602 12/18/23 0521 12/19/23 0625  WBC 14.5* 10.3 9.0 7.1  NEUTROABS  --  8.1* 6.6 5.0  HGB 18.6* 15.6 15.1 14.7  HCT 53.1* 45.0 45.5 42.4  MCV 95.8 96.8 99.6 97.0  PLT 286 237 237 214   Cardiac Enzymes: No results for input(s): CKTOTAL, CKMB, CKMBINDEX, TROPONINI in the last 168 hours. BNP: BNP (last 3 results) No results for input(s): BNP in the last 8760 hours.  ProBNP (last 3 results) No results for input(s): PROBNP in the last 8760 hours.  CBG: Recent Labs  Lab 12/18/23 0424  GLUCAP 106*       Signed:  Devaughn KATHEE Ban MD.  [facility] 12/19/2023, 2:51 PM

## 2024-01-10 ENCOUNTER — Other Ambulatory Visit: Payer: Self-pay

## 2024-01-10 ENCOUNTER — Inpatient Hospital Stay
Admission: EM | Admit: 2024-01-10 | Discharge: 2024-01-13 | DRG: 392 | Disposition: A | Attending: Emergency Medicine | Admitting: Emergency Medicine

## 2024-01-10 ENCOUNTER — Other Ambulatory Visit: Payer: Self-pay | Admitting: Obstetrics and Gynecology

## 2024-01-10 DIAGNOSIS — R112 Nausea with vomiting, unspecified: Secondary | ICD-10-CM

## 2024-01-10 DIAGNOSIS — R197 Diarrhea, unspecified: Secondary | ICD-10-CM | POA: Diagnosis not present

## 2024-01-10 DIAGNOSIS — N179 Acute kidney failure, unspecified: Secondary | ICD-10-CM

## 2024-01-10 LAB — URINALYSIS, ROUTINE W REFLEX MICROSCOPIC
Bilirubin Urine: NEGATIVE
Glucose, UA: NEGATIVE mg/dL
Hgb urine dipstick: NEGATIVE
Ketones, ur: NEGATIVE mg/dL
Leukocytes,Ua: NEGATIVE
Nitrite: NEGATIVE
Protein, ur: 100 mg/dL — AB
Specific Gravity, Urine: 1.02 (ref 1.005–1.030)
pH: 5 (ref 5.0–8.0)

## 2024-01-10 LAB — GASTROINTESTINAL PANEL BY PCR, STOOL (REPLACES STOOL CULTURE)

## 2024-01-10 LAB — COMPREHENSIVE METABOLIC PANEL WITH GFR
ALT: 151 U/L — ABNORMAL HIGH (ref 0–44)
AST: 77 U/L — ABNORMAL HIGH (ref 15–41)
Albumin: 4.3 g/dL (ref 3.5–5.0)
Alkaline Phosphatase: 111 U/L (ref 38–126)
Anion gap: 15 (ref 5–15)
BUN: 19 mg/dL (ref 8–23)
CO2: 15 mmol/L — ABNORMAL LOW (ref 22–32)
Calcium: 9.6 mg/dL (ref 8.9–10.3)
Chloride: 109 mmol/L (ref 98–111)
Creatinine, Ser: 2.08 mg/dL — ABNORMAL HIGH (ref 0.61–1.24)
GFR, Estimated: 34 mL/min — ABNORMAL LOW (ref >60)
Glucose, Bld: 104 mg/dL — ABNORMAL HIGH (ref 70–99)
Potassium: 4.2 mmol/L (ref 3.5–5.1)
Sodium: 138 mmol/L (ref 135–145)
Total Bilirubin: 0.9 mg/dL (ref 0.0–1.2)
Total Protein: 7.5 g/dL (ref 6.5–8.1)

## 2024-01-10 LAB — CBC
HCT: 50.9 % (ref 39.0–52.0)
HCT: 44.5 % (ref 39.0–52.0)
Hemoglobin: 17.2 g/dL — ABNORMAL HIGH (ref 13.0–17.0)
Hemoglobin: 15 g/dL (ref 13.0–17.0)
MCH: 32.9 pg (ref 26.0–34.0)
MCH: 33.2 pg (ref 26.0–34.0)
MCHC: 33.8 g/dL (ref 30.0–36.0)
MCHC: 33.7 g/dL (ref 30.0–36.0)
MCV: 97.3 fL (ref 80.0–100.0)
MCV: 98.5 fL (ref 80.0–100.0)
Platelets: 286 K/uL (ref 150–400)
Platelets: 232 K/uL (ref 150–400)
RBC: 5.23 MIL/uL (ref 4.22–5.81)
RBC: 4.52 MIL/uL (ref 4.22–5.81)
RDW: 12.4 % (ref 11.5–15.5)
RDW: 12.6 % (ref 11.5–15.5)
WBC: 8.3 K/uL (ref 4.0–10.5)
WBC: 5.7 K/uL (ref 4.0–10.5)
nRBC: 0 % (ref 0.0–0.2)
nRBC: 0 % (ref 0.0–0.2)

## 2024-01-10 LAB — C-REACTIVE PROTEIN: CRP: 0.5 mg/dL (ref <1.0)

## 2024-01-10 LAB — C DIFFICILE QUICK SCREEN W PCR REFLEX
C Diff antigen: NEGATIVE
C Diff interpretation: NOT DETECTED
C Diff toxin: NEGATIVE

## 2024-01-10 LAB — TROPONIN T, HIGH SENSITIVITY: Troponin T High Sensitivity: 23 ng/L — ABNORMAL HIGH (ref 0–19)

## 2024-01-10 LAB — TSH: TSH: 1.14 u[IU]/mL (ref 0.350–4.500)

## 2024-01-10 LAB — CREATININE, SERUM
Creatinine, Ser: 1.56 mg/dL — ABNORMAL HIGH (ref 0.61–1.24)
GFR, Estimated: 49 mL/min — ABNORMAL LOW (ref >60)

## 2024-01-10 MED ORDER — HEPARIN SODIUM (PORCINE) 5000 UNIT/ML IJ SOLN
5000.0000 [IU] | Freq: Three times a day (TID) | INTRAMUSCULAR | Status: DC
  Administered 2024-01-10 – 2024-01-13 (×8): 5000 [IU] via SUBCUTANEOUS
  Filled 2024-01-10 (×8): qty 1

## 2024-01-10 MED ORDER — SODIUM CHLORIDE 0.9 % IV BOLUS
1000.0000 mL | Freq: Once | INTRAVENOUS | Status: AC
  Administered 2024-01-10: 1000 mL via INTRAVENOUS

## 2024-01-10 MED ORDER — ACETAMINOPHEN 650 MG RE SUPP: 650.0000 mg | Freq: Four times a day (QID) | RECTAL | Status: DC | PRN

## 2024-01-10 MED ORDER — ONDANSETRON HCL 4 MG PO TABS: 4.0000 mg | ORAL_TABLET | Freq: Four times a day (QID) | ORAL | Status: DC | PRN

## 2024-01-10 MED ORDER — SODIUM BICARBONATE 650 MG PO TABS
650.0000 mg | ORAL_TABLET | Freq: Two times a day (BID) | ORAL | Status: DC
  Administered 2024-01-10 – 2024-01-13 (×6): 650 mg via ORAL
  Filled 2024-01-10 (×6): qty 1

## 2024-01-10 MED ORDER — ACETAMINOPHEN 325 MG PO TABS
650.0000 mg | ORAL_TABLET | Freq: Four times a day (QID) | ORAL | Status: DC | PRN
  Administered 2024-01-12: 650 mg via ORAL
  Filled 2024-01-10: qty 2

## 2024-01-10 MED ORDER — LACTATED RINGERS IV SOLN: INTRAVENOUS | Status: AC

## 2024-01-10 MED ORDER — ONDANSETRON HCL 4 MG/2ML IJ SOLN
4.0000 mg | Freq: Four times a day (QID) | INTRAMUSCULAR | Status: DC | PRN
  Administered 2024-01-11: 4 mg via INTRAVENOUS
  Filled 2024-01-10: qty 2

## 2024-01-10 NOTE — ED Triage Notes (Signed)
 Pt presents to the ED via POV from [facility] clinic for diarrhea, nausea, dizziness, and vomiting. Pt was admitted on 11/6 for same sx's, but states that he was told it was a viral infection. Pt has been taking imodium  and states that it has not helped the diarrhea. Pt A&Ox4. BP 84/54 (65)

## 2024-01-10 NOTE — ED Provider Notes (Signed)
 [facility] Provider Note    Event Date/Time   First MD Initiated Contact with Patient 01/10/24 1222     (approximate)   History   Diarrhea   HPI  Jonathan Andrews. is a 68 y.o. male with history of GERD, hypertension, kidney stones, diverticulosis who comes in with concerns for diarrhea.  I reviewed a note where patient was started on Cipro  and Flagyl by PCP he was found to have worsening renal function and dehydration and CT scan was done that found diarrhea.  He was admitted for AKI but had stool testing that was negative.  Patient reports that the diarrhea is never stopped since he was last admitted he reports continued ongoing diarrhea issues.  He denies any abdominal pain.  Denies any blood in stools.  Physical Exam   Triage Vital Signs: ED Triage Vitals [01/10/24 1149]  Encounter Vitals Group     BP (!) 84/54     Girls Systolic BP Percentile      Girls Diastolic BP Percentile      Boys Systolic BP Percentile      Boys Diastolic BP Percentile      Pulse Rate (!) 114     Resp 18     Temp 98.3 F (36.8 C)     Temp Source Oral     SpO2 98 %     Weight 237 lb (107.5 kg)     Height 6' 4 (1.93 m)     Head Circumference      Peak Flow      Pain Score 5     Pain Loc      Pain Education      Exclude from Growth Chart     Most recent vital signs: Vitals:   01/10/24 1149  BP: (!) 84/54  Pulse: (!) 114  Resp: 18  Temp: 98.3 F (36.8 C)  SpO2: 98%     General: Awake, no distress.  CV:  Good peripheral perfusion.  Tachycardic Resp:  Normal effort.  Abd:  No distention.  Soft and nontender Other:     ED Results / Procedures / Treatments   Labs (all labs ordered are listed, but only abnormal results are displayed) Labs Reviewed  CBC - Abnormal; Notable for the following components:      Result Value   Hemoglobin 17.2 (*)    All other components within normal limits  GASTROINTESTINAL PANEL BY PCR, STOOL (REPLACES STOOL  CULTURE)  C DIFFICILE QUICK SCREEN W PCR REFLEX    COMPREHENSIVE METABOLIC PANEL WITH GFR  URINALYSIS, ROUTINE W REFLEX MICROSCOPIC     EKG  My interpretation of EKG:  Sinus rate of 96 no any ST elevation, T wave version lead III, normal intervals  RADIOLOGY   PROCEDURES:  Critical Care performed: No  .1-3 Lead EKG Interpretation  Performed by: Ernest Ronal FORBES, MD Authorized by: Ernest Ronal FORBES, MD     Interpretation: normal     ECG rate:  70   ECG rate assessment: normal     Rhythm: sinus rhythm     Ectopy: none     Conduction: normal      MEDICATIONS ORDERED IN ED: Medications  sodium chloride  0.9 % bolus 1,000 mL (1,000 mLs Intravenous New Bag/Given 01/10/24 1201)     IMPRESSION / MDM / ASSESSMENT AND PLAN / ED COURSE  I reviewed the triage vital signs and the nursing notes.   Patient's presentation is most consistent with acute  presentation with potential threat to life or bodily function.   Patient comes in with concerns for dehydration he was hypotensive, tachycardic consistent diarrhea.  Abdomen soft and nontender low suspicion for acute abdominal process repeat blood work done evaluate for [employer] abnormalities, AKI.  Patient noted to have AKI with low bicarb.  I do not feel there is any indication for repeat CT imaging given he denies any abdominal pain I will get repeat stool testing.  Given worsening AKI with near syncopal symptoms will discuss to hospital team for admission.  The patient is on the cardiac monitor to evaluate for evidence of arrhythmia and/or significant heart rate changes.      FINAL CLINICAL IMPRESSION(S) / ED DIAGNOSES   Final diagnoses:  Diarrhea, unspecified type  AKI (acute kidney injury)     Rx / DC Orders   ED Discharge Orders     None        Note:  This document was prepared using Dragon voice recognition software and may include unintentional dictation errors.   Ernest Ronal BRAVO, MD 01/10/24 [phone]

## 2024-01-10 NOTE — ED Notes (Signed)
 Pt informed of need for urine and stool sample. States unable to go at this time.

## 2024-01-10 NOTE — H&P (Signed)
 History and Physical    Patient: Jonathan Andrews. FMW:[id number] DOB: 05-04-1955 DOA: 01/10/2024 DOS: the patient was seen and examined on 01/10/2024 PCP: [name], MD  Patient coming from: Home  Chief Complaint:  Chief Complaint  Patient presents with   Diarrhea   HPI: Jonathan Andrews. is a 68 y.o. male with medical history significant of HTN, with recent admission from 11/7-11/10 for evaluation of diarrhea presents to the emergency department for evaluation of ongoing diarrhea. Patient states after discharge, he was having 1 loose stool a day, however volume and frequency have increased back to 6-8 nonbloody, watery bowel movements daily.  He has been compliant with Imodium  and sodium bicarb tabs.  He reports after eating, he gets dizzy, diaphoretic, and then has to use the rest room. No syncope. He does report having to get up in the middle of the night to have a BM as well.  He has had a few episodes of vomiting, however denies nausea. Also denies fevers and abdominal pain.  Earlier this month, his stool culture including C. difficile was negative  In the emergency department, he was initially hypotensive and tachycardic but fluid responsive.  No leukocytosis.  Creatinine is up to 2.08 from 1.27 on discharge.  AST and ALT mildly elevated at 77/51. Bicarb low at 15.    Review of Systems: Review of Systems  Constitutional:  Positive for weight loss. Negative for chills, fever and malaise/fatigue.  Eyes:  Negative for blurred vision.  Respiratory:  Negative for cough and shortness of breath.   Cardiovascular:  Negative for chest pain and leg swelling.  Gastrointestinal:  Positive for diarrhea and vomiting. Negative for abdominal pain, blood in stool, constipation, heartburn, melena and nausea.  Genitourinary:  Negative for dysuria and frequency.  Musculoskeletal:  Negative for falls and joint pain.  Skin:  Negative for rash.  Neurological:  Positive for dizziness.  Negative for focal weakness, loss of consciousness and headaches.    Past Medical History:  Diagnosis Date   Allergy    Bladder stones    GERD (gastroesophageal reflux disease)    Hemorrhoids    History of kidney stones    Hypertension    Past Surgical History:  Procedure Laterality Date   BIOPSY OF SKIN SUBCUTANEOUS TISSUE AND/OR MUCOUS MEMBRANE  05/19/2023   Procedure: BIOPSY, SKIN, SUBCUTANEOUS TISSUE, OR MUCOUS MEMBRANE;  Surgeon: Wilhelmenia Aloha Mickey., MD;  Location: [facility];  Service: Gastroenterology;;   COLONOSCOPY N/A 08/15/2020   Procedure: COLONOSCOPY;  Surgeon: Legrand Victory LITTIE DOUGLAS, MD;  Location: [facility];  Service: Gastroenterology;  Laterality: N/A;   COLONOSCOPY N/A 05/19/2023   Procedure: COLONOSCOPY;  Surgeon: Wilhelmenia Aloha Mickey., MD;  Location: [facility];  Service: Gastroenterology;  Laterality: N/A;   COLONOSCOPY WITH PROPOFOL  N/A 10/27/2020   Procedure: COLONOSCOPY WITH PROPOFOL ;  Surgeon: Wilhelmenia Aloha Mickey., MD;  Location: [facility];  Service: Gastroenterology;  Laterality: N/A;   CYSTOSCOPY WITH LITHOLAPAXY N/A 03/27/2019   Procedure: CYSTOSCOPY WITH LITHOLAPAXY;  Surgeon: Twylla Glendia BROCKS, MD;  Location: [facility];  Service: Urology;  Laterality: N/A;   ENDOSCOPIC MUCOSAL RESECTION N/A 10/27/2020   Procedure: ENDOSCOPIC MUCOSAL RESECTION;  Surgeon: Wilhelmenia Aloha Mickey., MD;  Location: [facility];  Service: Gastroenterology;  Laterality: N/A;   EUS N/A 05/19/2023   Procedure: ULTRASOUND, UPPER GI TRACT, ENDOSCOPIC;  Surgeon: Wilhelmenia Aloha Mickey., MD;  Location: [facility];  Service: Gastroenterology;  Laterality: N/A;   EVALUATION UNDER ANESTHESIA WITH HEMORRHOIDECTOMY N/A 04/17/2020  Procedure: HEMORRHOIDECTOMY;  Surgeon: Dasie Leonor CROME, MD;  Location: [facility];  Service: General;  Laterality: N/A;   EXTRACORPOREAL SHOCK WAVE LITHOTRIPSY Left 08/02/2019   Procedure: EXTRACORPOREAL SHOCK WAVE LITHOTRIPSY (ESWL);  Surgeon: Twylla Glendia BROCKS,  MD;  Location: [facility];  Service: Urology;  Laterality: Left;   HAND SURGERY     HAND SURGERY Bilateral    HEMOSTASIS CLIP PLACEMENT  10/27/2020   Procedure: HEMOSTASIS CLIP PLACEMENT;  Surgeon: Wilhelmenia Aloha Raddle., MD;  Location: [facility];  Service: Gastroenterology;;   POLYPECTOMY  05/19/2023   Procedure: POLYPECTOMY, INTESTINE;  Surgeon: Wilhelmenia Aloha Raddle., MD;  Location: [facility];  Service: Gastroenterology;;   RECTAL EXAM UNDER ANESTHESIA  04/17/2020   Procedure: RECTAL EXAM UNDER ANESTHESIA;  Surgeon: Dasie Leonor CROME, MD;  Location: [facility];  Service: General;;   SUBMUCOSAL LIFTING INJECTION  10/27/2020   Procedure: SUBMUCOSAL LIFTING INJECTION;  Surgeon: Wilhelmenia Aloha Raddle., MD;  Location: [facility];  Service: Gastroenterology;;   TENNIS ELBOW RELEASE/NIRSCHEL PROCEDURE Bilateral 04/14/2017   Procedure: TENNIS ELBOW RELEASE/NIRSCHEL PROCEDURE;  Surgeon: Kathlynn Sharper, MD;  Location: [facility];  Service: Orthopedics;  Laterality: Bilateral;   TENNIS ELBOW RELEASE/NIRSCHEL PROCEDURE Right 01/03/2018   Procedure: TENNIS ELBOW RELEASE/NIRSCHEL PROCEDURE;  Surgeon: Kathlynn Sharper, MD;  Location: [facility];  Service: Orthopedics;  Laterality: Right;   TONSILLECTOMY     UMBILICAL HERNIA REPAIR  08/22/2020   Procedure: HERNIA REPAIR UMBILICAL ADULT WITH MESH;  Surgeon: Tye Millet, DO;  Location: [facility];  Service: General;;   Social History:  reports that he quit smoking about 37 years ago. His smoking use included cigarettes. He started smoking about 49 years ago. He has a 24 pack-year smoking history. He has never used smokeless tobacco. He reports current alcohol use. He reports that he does not use drugs.  Allergies  Allergen Reactions   Penicillins Hives    Has patient had a PCN reaction causing immediate rash, facial/tongue/throat swelling, SOB or lightheadedness with hypotension: No Has patient had a PCN reaction causing severe rash involving mucus membranes or skin  necrosis: No Has patient had a PCN reaction that required hospitalization: No Has patient had a PCN reaction occurring within the last 10 years: Unknown If all of the above answers are NO, then may proceed with Cephalosporin use.    Contrast Media [Iodinated Contrast Media] Other (See Comments)    migraine    Hydrocodone -Acetaminophen  Itching    No rash    Family History  Problem Relation Age of Onset   Prostate cancer Father    Bladder Cancer Neg Hx    Kidney cancer Neg Hx    Colon cancer Neg Hx    Esophageal cancer Neg Hx    Inflammatory bowel disease Neg Hx    Liver disease Neg Hx    Rectal cancer Neg Hx    Stomach cancer Neg Hx    Pancreatic cancer Neg Hx     Prior to Admission medications   Medication Sig Start Date End Date Taking? Authorizing Provider  loperamide  (IMODIUM ) 2 MG capsule Take 1 capsule (2 mg total) by mouth as needed for diarrhea or loose stools. 12/19/23   Wouk, Devaughn Sayres, MD  meclizine (ANTIVERT) 25 MG tablet Take 25 mg by mouth 3 (three) times daily as needed (vertigo).    [provider]  Multiple Vitamin (MULTIVITAMIN WITH MINERALS) TABS tablet Take 1 tablet by mouth daily. Vita Pak 50 +    [provider]  ondansetron  (ZOFRAN -ODT) 4 MG disintegrating  tablet Take 4 mg by mouth every 8 (eight) hours as needed for refractory nausea / vomiting. 12/12/23   [provider]  pantoprazole  (PROTONIX ) 20 MG tablet Take 1 tablet (20 mg total) by mouth 2 (two) times daily. 05/31/23   Mansouraty, Aloha Raddle., MD  sodium bicarbonate  650 MG tablet Take 1 tablet (650 mg total) by mouth 2 (two) times daily. 12/19/23 12/18/24  Wouk, Devaughn Sayres, MD  tamsulosin  (FLOMAX ) 0.4 MG CAPS capsule Daily as needed to facilitate kidney stone passage 12/19/23   Wouk, Devaughn Sayres, MD    Physical Exam: Vitals:   01/10/24 1400 01/10/24 1430 01/10/24 1812 01/10/24 1836  BP: 108/72 111/76  136/88  Pulse: 77 75  74  Resp:  16  20  Temp:   98 F (36.7  C) 97.6 F (36.4 C)  TempSrc:   Oral Oral  SpO2: 99% 100%  100%  Weight:      Height:       Physical Exam Vitals and nursing note reviewed.  Constitutional:      General: He is not in acute distress.    Appearance: He is normal weight. He is not ill-appearing or toxic-appearing.  HENT:     Head: Normocephalic and atraumatic.     Mouth/Throat:     Mouth: Mucous membranes are moist.  Eyes:     General: No scleral icterus.    Extraocular Movements: Extraocular movements intact.     Pupils: Pupils are equal, round, and reactive to light.  Cardiovascular:     Rate and Rhythm: Normal rate and regular rhythm.  Pulmonary:     Effort: Pulmonary effort is normal. No respiratory distress.     Breath sounds: Normal breath sounds.  Abdominal:     General: Bowel sounds are normal. There is no distension.     Palpations: Abdomen is soft.     Tenderness: There is no abdominal tenderness.  Musculoskeletal:        General: Normal range of motion.     Cervical back: Neck supple.     Right lower leg: No edema.     Left lower leg: No edema.  Skin:    General: Skin is warm and dry.     Capillary Refill: Capillary refill takes less than 2 seconds.  Neurological:     General: No focal deficit present.     Mental Status: He is alert and oriented to person, place, and time. Mental status is at baseline.  Psychiatric:        Mood and Affect: Mood normal.        Behavior: Behavior normal.     Data Reviewed:   Labs on Admission: I have personally reviewed following labs and imaging studies  CBC: Recent Labs  Lab 01/10/24 1157 01/10/24 1754  WBC 8.3 5.7  HGB 17.2* 15.0  HCT 50.9 44.5  MCV 97.3 98.5  PLT 286 232   Basic Metabolic Panel: Recent Labs  Lab 01/10/24 1157 01/10/24 1754  NA 138  --   K 4.2  --   CL 109  --   CO2 15*  --   GLUCOSE 104*  --   BUN 19  --   CREATININE 2.08* 1.56*  CALCIUM 9.6  --    GFR: Estimated Creatinine Clearance: 62.7 mL/min (A) (by C-G  formula based on SCr of 1.56 mg/dL (H)). Liver Function Tests: Recent Labs  Lab 01/10/24 1157  AST 77*  ALT 151*  ALKPHOS 111  BILITOT 0.9  PROT  7.5  ALBUMIN 4.3   No results for input(s): LIPASE, AMYLASE in the last 168 hours. No results for input(s): AMMONIA in the last 168 hours. Coagulation Profile: No results for input(s): INR, PROTIME in the last 168 hours. Cardiac Enzymes: No results for input(s): CKTOTAL, CKMB, CKMBINDEX, TROPONINI in the last 168 hours. BNP (last 3 results) No results for input(s): PROBNP in the last 8760 hours. HbA1C: No results for input(s): HGBA1C in the last 72 hours. CBG: No results for input(s): GLUCAP in the last 168 hours. Lipid Profile: No results for input(s): CHOL, HDL, LDLCALC, TRIG, CHOLHDL, LDLDIRECT in the last 72 hours. Thyroid Function Tests: Recent Labs    01/10/24 1754  TSH 1.140   Anemia Panel: No results for input(s): VITAMINB12, FOLATE, FERRITIN, TIBC, IRON, RETICCTPCT in the last 72 hours. Urine analysis:    Component Value Date/Time   COLORURINE AMBER (A) 01/10/2024 1456   APPEARANCEUR CLOUDY (A) 01/10/2024 1456   APPEARANCEUR Clear 08/23/2019 0833   LABSPEC 1.020 01/10/2024 1456   LABSPEC 1.016 06/10/2012 2152   PHURINE 5.0 01/10/2024 1456   GLUCOSEU NEGATIVE 01/10/2024 1456   GLUCOSEU Negative 06/10/2012 2152   HGBUR NEGATIVE 01/10/2024 1456   BILIRUBINUR NEGATIVE 01/10/2024 1456   BILIRUBINUR Negative 08/23/2019 0833   BILIRUBINUR Negative 06/10/2012 2152   KETONESUR NEGATIVE 01/10/2024 1456   PROTEINUR 100 (A) 01/10/2024 1456   NITRITE NEGATIVE 01/10/2024 1456   LEUKOCYTESUR NEGATIVE 01/10/2024 1456   LEUKOCYTESUR Negative 06/10/2012 2152    Radiological Exams on Admission: No results found.     Assessment and Plan:  Dehydration  Chronic diarrhea  - Etiology unclear.  Hospitalized earlier this month for the same.  GI panel negative at that time. Has  been taking Imodium  daily without relief Consider endocrinologic or inflammatory etiologies - cont Na bicarb tabs, IVF  - repeat stool studies. Check TSH, CRP, fecal procal - consider GI consult   Hypotension  - 2/2 volume depletion. Fluid responsive - continue IVF  - hold antihypertensives   AKI  - Cr 2.08, baseline near 1. In the setting of above - hold nephrotoxic agents  - IVF and monitor   HTN  - hold antihypertensives   Reg diet  Heparin  LR@75  Monitor/replace electrolytes   Advance Care Planning:   Code Status: Full Code Discussed with patient at time of admission    Severity of Illness: The appropriate patient status for this patient is INPATIENT. Inpatient status is judged to be reasonable and necessary in order to provide the required intensity of service to ensure the patient's safety. The patient's presenting symptoms, physical exam findings, and initial radiographic and laboratory data in the context of their chronic comorbidities is felt to place them at high risk for further clinical deterioration. Furthermore, it is not anticipated that the patient will be medically stable for discharge from the hospital within 2 midnights of admission.   * I certify that at the point of admission it is my clinical judgment that the patient will require inpatient hospital care spanning beyond 2 midnights from the point of admission due to high intensity of service, high risk for further deterioration and high frequency of surveillance required.*  Author: Daved JAYSON Pump, DO 01/10/2024 7:44 PM  For on call review [email or web address].

## 2024-01-10 NOTE — ED Triage Notes (Signed)
 First nurse note: pt to ED from [facility] for diarrhea, nausea daily, reports near syncope.

## 2024-01-10 NOTE — ED Notes (Signed)
 Elnor top and 1 set cultures sent to lab

## 2024-01-10 NOTE — ED Notes (Signed)
 NURSE HECTOR RN INFORMED OF ASSIGNED BED

## 2024-01-11 DIAGNOSIS — R197 Diarrhea, unspecified: Secondary | ICD-10-CM | POA: Diagnosis not present

## 2024-01-11 LAB — GASTROINTESTINAL PANEL BY PCR, STOOL (REPLACES STOOL CULTURE)

## 2024-01-11 LAB — COMPREHENSIVE METABOLIC PANEL WITH GFR
ALT: 108 U/L — ABNORMAL HIGH (ref 0–44)
AST: 59 U/L — ABNORMAL HIGH (ref 15–41)
Albumin: 3.4 g/dL — ABNORMAL LOW (ref 3.5–5.0)
Alkaline Phosphatase: 84 U/L (ref 38–126)
Anion gap: 8 (ref 5–15)
BUN: 17 mg/dL (ref 8–23)
CO2: 18 mmol/L — ABNORMAL LOW (ref 22–32)
Calcium: 8.5 mg/dL — ABNORMAL LOW (ref 8.9–10.3)
Chloride: 110 mmol/L (ref 98–111)
Creatinine, Ser: 1.51 mg/dL — ABNORMAL HIGH (ref 0.61–1.24)
GFR, Estimated: 51 mL/min — ABNORMAL LOW (ref >60)
Glucose, Bld: 86 mg/dL (ref 70–99)
Potassium: 4.1 mmol/L (ref 3.5–5.1)
Sodium: 137 mmol/L (ref 135–145)
Total Bilirubin: 0.7 mg/dL (ref 0.0–1.2)
Total Protein: 5.9 g/dL — ABNORMAL LOW (ref 6.5–8.1)

## 2024-01-11 LAB — CBC
HCT: 42.1 % (ref 39.0–52.0)
Hemoglobin: 14.5 g/dL (ref 13.0–17.0)
MCH: 33.4 pg (ref 26.0–34.0)
MCHC: 34.4 g/dL (ref 30.0–36.0)
MCV: 97 fL (ref 80.0–100.0)
Platelets: 241 K/uL (ref 150–400)
RBC: 4.34 MIL/uL (ref 4.22–5.81)
RDW: 12.5 % (ref 11.5–15.5)
WBC: 5.8 K/uL (ref 4.0–10.5)
nRBC: 0 % (ref 0.0–0.2)

## 2024-01-11 LAB — C DIFFICILE QUICK SCREEN W PCR REFLEX
C Diff antigen: NEGATIVE
C Diff interpretation: NOT DETECTED
C Diff toxin: NEGATIVE

## 2024-01-11 MED ORDER — BACID PO TABS
2.0000 | ORAL_TABLET | Freq: Three times a day (TID) | ORAL | Status: DC
  Filled 2024-01-11: qty 2

## 2024-01-11 MED ORDER — SACCHAROMYCES BOULARDII 250 MG PO CAPS
250.0000 mg | ORAL_CAPSULE | Freq: Two times a day (BID) | ORAL | Status: DC
  Administered 2024-01-11 – 2024-01-13 (×5): 250 mg via ORAL
  Filled 2024-01-11 (×6): qty 1

## 2024-01-11 MED ORDER — RISAQUAD PO CAPS
1.0000 | ORAL_CAPSULE | Freq: Two times a day (BID) | ORAL | Status: DC
  Administered 2024-01-11 – 2024-01-13 (×5): 1 via ORAL
  Filled 2024-01-11 (×5): qty 1

## 2024-01-11 MED ORDER — ENSURE PLUS HIGH PROTEIN PO LIQD
237.0000 mL | Freq: Two times a day (BID) | ORAL | Status: DC
  Administered 2024-01-11: 237 mL via ORAL

## 2024-01-11 MED ORDER — SODIUM CHLORIDE 0.9 % IV SOLN
12.5000 mg | Freq: Four times a day (QID) | INTRAVENOUS | Status: DC | PRN
  Administered 2024-01-11: 12.5 mg via INTRAVENOUS
  Filled 2024-01-11: qty 12.5

## 2024-01-11 NOTE — TOC CM/SW Note (Signed)
 Transition of Care [facility] - Inpatient Brief Assessment   Patient Details  Name: Jonathan Andrews. MRN: [id number] Date of Birth: 1956-02-05  Transition of Care [facility]) CM/SW Contact:    Daved JONETTA Hamilton, RN Phone Number: 01/11/2024, 9:49 AM   Clinical Narrative:   Transition of Care [facility]) Screening Note   Patient Details  Name: Jonathan Andrews. Date of Birth: 11/01/1955   Transition of Care [facility] CM/SW Contact:    Daved JONETTA Hamilton, RN Phone Number: 01/11/2024, 9:49 AM    Transition of Care Department [facility]) has reviewed patient and no TOC needs have been identified at this time. If new patient transition needs arise, please place a TOC consult.    Transition of Care Asessment: Insurance and Status: Insurance coverage has been reviewed Patient has primary care physician: Yes   Prior level of function:: Independent Prior/Current Home Services: No current home services Social Drivers of Health Review: SDOH reviewed no interventions necessary Readmission risk has been reviewed: Yes Transition of care needs: no transition of care needs at this time

## 2024-01-11 NOTE — Progress Notes (Signed)
 PROGRESS NOTE Jonathan Andrews.    DOB: [birth year], 68 y.o.  FMW:[id number]    Code Status: Full Code   DOA: 01/10/2024   LOS: 1  Brief hospital course  Jonathan Andrews. is a 67 y.o. male with a PMH significant for HTN, with recent admission from 11/7-11/10 for evaluation of diarrhea presents to the emergency department for evaluation of ongoing diarrhea. About a month of fluctuating frequency of loose Bms mostly post-prandial. Has gas and colicky abdominal pain relieved with Bms. Cdiff testing negative multiple times and did receive abx at one time. Also had vomiting at one time.  Imodium  not effective.  No fever or abdominal pain. No nausea or vomiting. Has had 2 Bms today. Did not collect for stool sample. No leukocytosis.   Assessment & Plan  Principal Problem:   Diarrhea  Acute to sub acute diarrhea- based on thorough history taking, my suspicion is that he originally had viral gastroenteritis about a month ago and has not had good recovery of his microbiome since infection, made worse with antibiotic course. At risk of cdiff but testing negative twice. No leukocytosis or fever.  - ordering probiotics.  - continue imodium  PRN - stool PCR negative - if not improving, can consider GI consult to evaluate for functional vs autoimmune vs other less likely  - f/u fecal calprotectin  Dehydration 2/2 ongoing diarrhea AKI- creatinine improving with IVF . Cr peaked at 2.08>1.51 - continue IVF  - hold antihypertensives  - low fiber diet    HTN  - hold antihypertensives   Body mass index is 28.85 kg/m.  VTE ppx: heparin injection 5,000 Units Start: 01/10/24 1800  Diet:     Diet   Diet regular Room service appropriate? Yes; Fluid consistency: Thin   Consultants: None   Subjective 01/11/24    Pt reports no abdominal pain currently. Had BM at about 330 this morning. No nausea. Will report his Bms from now on so we can document properly.    Objective  Blood pressure  121/74, pulse 67, temperature 97.8 F (36.6 C), resp. rate 18, height 6' 4 (1.93 m), weight 107.5 kg, SpO2 98%.  Intake/Output Summary (Last 24 hours) at 01/11/2024 0848 Last data filed at 01/10/2024 1858 Gross per 24 hour  Intake 1067.5 ml  Output --  Net 1067.5 ml   Filed Weights   01/10/24 1149  Weight: 107.5 kg    Physical Exam:  General: awake, alert, NAD HEENT: atraumatic, clear conjunctiva, anicteric sclera, MMM, hearing grossly normal Respiratory: normal respiratory effort. Cardiovascular: extremities well perfused, quick capillary refill, normal S1/S2, RRR, no JVD, murmurs Gastrointestinal: soft, NT, ND Nervous: A&O x3. no gross focal neurologic deficits, normal speech Extremities: moves all equally, no edema, normal tone Skin: dry, intact, normal temperature, normal color. No rashes, lesions or ulcers on exposed skin Psychiatry: normal mood, congruent affect  Labs   I have personally reviewed the following labs and imaging studies CBC    Component Value Date/Time   WBC 5.8 01/11/2024 0533   RBC 4.34 01/11/2024 0533   HGB 14.5 01/11/2024 0533   HGB 15.2 06/10/2012 2038   HCT 42.1 01/11/2024 0533   HCT 44.0 06/10/2012 2038   PLT 241 01/11/2024 0533   PLT 202 06/10/2012 2038   MCV 97.0 01/11/2024 0533   MCV 96 06/10/2012 2038   MCH 33.4 01/11/2024 0533   MCHC 34.4 01/11/2024 0533   RDW 12.5 01/11/2024 0533   RDW 12.5 06/10/2012 2038   LYMPHSABS  1.4 12/19/2023 0625   MONOABS 0.7 12/19/2023 0625   EOSABS 0.1 12/19/2023 0625   BASOSABS 0.0 12/19/2023 0625      Latest Ref Rng & Units 01/11/2024    5:33 AM 01/10/2024    5:54 PM 01/10/2024   11:57 AM  BMP  Glucose 70 - 99 mg/dL 86   895   BUN 8 - 23 mg/dL 17   19   Creatinine 9.38 - 1.24 mg/dL 8.48  8.43  7.91   Sodium 135 - 145 mmol/L 137   138   Potassium 3.5 - 5.1 mmol/L 4.1   4.2   Chloride 98 - 111 mmol/L 110   109   CO2 22 - 32 mmol/L 18   15   Calcium 8.9 - 10.3 mg/dL 8.5   9.6     No results  found.  Disposition Plan & Communication  Patient status: Inpatient  Admitted From: Home Planned disposition location: Home Anticipated discharge date: 12/4 pending clinical improvement, AKI resolved  Family Communication: none at bedside    Author: Marien LITTIE Piety, DO [facility] 01/11/2024, 8:48 AM   Available by Epic secure chat 7AM-7PM. If 7PM-7AM, please contact night-coverage.  [facility] contact information found on [email or web address].

## 2024-01-11 NOTE — Plan of Care (Signed)

## 2024-01-12 DIAGNOSIS — R112 Nausea with vomiting, unspecified: Secondary | ICD-10-CM | POA: Diagnosis not present

## 2024-01-12 DIAGNOSIS — R197 Diarrhea, unspecified: Secondary | ICD-10-CM

## 2024-01-12 DIAGNOSIS — R748 Abnormal levels of other serum enzymes: Secondary | ICD-10-CM

## 2024-01-12 DIAGNOSIS — K760 Fatty (change of) liver, not elsewhere classified: Secondary | ICD-10-CM | POA: Diagnosis not present

## 2024-01-12 LAB — BASIC METABOLIC PANEL WITH GFR
Anion gap: 10 (ref 5–15)
BUN: 13 mg/dL (ref 8–23)
CO2: 17 mmol/L — ABNORMAL LOW (ref 22–32)
Calcium: 8.6 mg/dL — ABNORMAL LOW (ref 8.9–10.3)
Chloride: 113 mmol/L — ABNORMAL HIGH (ref 98–111)
Creatinine, Ser: 1.14 mg/dL (ref 0.61–1.24)
GFR, Estimated: >60 mL/min (ref >60)
Glucose, Bld: 88 mg/dL (ref 70–99)
Potassium: 3.9 mmol/L (ref 3.5–5.1)
Sodium: 139 mmol/L (ref 135–145)

## 2024-01-12 MED ORDER — PEG 3350-KCL-NA BICARB-NACL 420 G PO SOLR
4000.0000 mL | Freq: Once | ORAL | Status: AC
  Administered 2024-01-12: 4000 mL via ORAL
  Filled 2024-01-12: qty 4000

## 2024-01-12 MED ORDER — SODIUM CHLORIDE 0.9 % IV SOLN: INTRAVENOUS | Status: DC

## 2024-01-12 NOTE — Consult Note (Signed)
 Jonathan Copping, MD [facility]  [address], [address] [place], [place] [address] Phone: [phone] Fax : [phone]  Consultation  Referring Provider:     Dr. Lenon Primary Care Physician:  Jonathan Lynwood FALCON, MD Primary Gastroenterologist:  Dr. Wilhelmenia         Reason for Consultation:     Diarrhea with nausea vomiting  Date of Admission:  01/10/2024 Date of Consultation:  01/12/2024         HPI:   Jonathan Andrews. is a 68 y.o. male who reports that he started to have diarrhea a week prior to being seen at urgent care on November 3.  The patient states that he has seen Dr. 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  The patient states that he went to urgent care and was told that he may have an infection and was started on antibiotics.  The patient's diarrhea had not gotten any better and on his worst days will have up to 6 bowel movements a day.  The patient also reports that he has lost approximately 15 pounds in the last few weeks.  He states that he has been having watery stools without any blood in them.  He also reports that he had a bowel movement last night and then again this morning but nothing since then.  He states the frequency has decreased but the consistency is still watery and he does report some gas.  He has had intermittent vomiting with about 4 episodes of vomiting since Thanksgiving.   He reports that the vomiting is anywhere from 1/2-hour after he eats to an hour and a half after he eats but denies vomiting up food that was eaten many hours before.  The patient was recently discharged from the hospital with the same issues and was sent home with Imodium  and bicarb tablets.  He now comes in with a hemoglobin of 14.5 with a normal white cell count with a negative GI panel from yesterday and a negative C. difficile from 2 days ago.  Past Medical History:  Diagnosis  Date   Allergy    Bladder stones    GERD (gastroesophageal reflux disease)    Hemorrhoids    History of kidney stones    Hypertension     Past Surgical History:  Procedure Laterality Date   BIOPSY OF SKIN SUBCUTANEOUS TISSUE AND/OR MUCOUS MEMBRANE  05/19/2023   Procedure: BIOPSY, SKIN, SUBCUTANEOUS TISSUE, OR MUCOUS MEMBRANE;  Surgeon: Wilhelmenia Aloha Mickey., MD;  Location: [facility];  Service: Gastroenterology;;   COLONOSCOPY N/A 08/15/2020   Procedure: COLONOSCOPY;  Surgeon: Legrand Victory LITTIE DOUGLAS, MD;  Location: [facility];  Service: Gastroenterology;  Laterality: N/A;   COLONOSCOPY N/A 05/19/2023   Procedure: COLONOSCOPY;  Surgeon: Wilhelmenia Aloha Mickey., MD;  Location: [facility];  Service: Gastroenterology;  Laterality: N/A;   COLONOSCOPY WITH PROPOFOL  N/A 10/27/2020   Procedure: COLONOSCOPY WITH PROPOFOL ;  Surgeon: Wilhelmenia Aloha Mickey., MD;  Location: [facility];  Service: Gastroenterology;  Laterality: N/A;   CYSTOSCOPY WITH LITHOLAPAXY N/A 03/27/2019   Procedure: CYSTOSCOPY WITH LITHOLAPAXY;  Surgeon: Twylla Glendia BROCKS, MD;  Location: [facility];  Service: Urology;  Laterality: N/A;   ENDOSCOPIC MUCOSAL RESECTION N/A 10/27/2020   Procedure: ENDOSCOPIC MUCOSAL RESECTION;  Surgeon: Wilhelmenia Aloha Mickey., MD;  Location: [facility];  Service: Gastroenterology;  Laterality: N/A;   EUS N/A 05/19/2023  Procedure: ULTRASOUND, UPPER GI TRACT, ENDOSCOPIC;  Surgeon: Mansouraty, Aloha Raddle., MD;  Location: [facility];  Service: Gastroenterology;  Laterality: N/A;   EVALUATION UNDER ANESTHESIA WITH HEMORRHOIDECTOMY N/A 04/17/2020   Procedure: HEMORRHOIDECTOMY;  Surgeon: Dasie Leonor CROME, MD;  Location: [facility];  Service: General;  Laterality: N/A;   EXTRACORPOREAL SHOCK WAVE LITHOTRIPSY Left 08/02/2019   Procedure: EXTRACORPOREAL SHOCK WAVE LITHOTRIPSY (ESWL);  Surgeon: Twylla Glendia BROCKS, MD;  Location: [facility];  Service: Urology;  Laterality: Left;   HAND SURGERY     HAND SURGERY  Bilateral    HEMOSTASIS CLIP PLACEMENT  10/27/2020   Procedure: HEMOSTASIS CLIP PLACEMENT;  Surgeon: Wilhelmenia Aloha Raddle., MD;  Location: [facility];  Service: Gastroenterology;;   POLYPECTOMY  05/19/2023   Procedure: POLYPECTOMY, INTESTINE;  Surgeon: Wilhelmenia Aloha Raddle., MD;  Location: [facility];  Service: Gastroenterology;;   RECTAL EXAM UNDER ANESTHESIA  04/17/2020   Procedure: RECTAL EXAM UNDER ANESTHESIA;  Surgeon: Dasie Leonor CROME, MD;  Location: [facility];  Service: General;;   SUBMUCOSAL LIFTING INJECTION  10/27/2020   Procedure: SUBMUCOSAL LIFTING INJECTION;  Surgeon: Wilhelmenia Aloha Raddle., MD;  Location: [facility];  Service: Gastroenterology;;   TENNIS ELBOW RELEASE/NIRSCHEL PROCEDURE Bilateral 04/14/2017   Procedure: TENNIS ELBOW RELEASE/NIRSCHEL PROCEDURE;  Surgeon: Kathlynn Sharper, MD;  Location: [facility];  Service: Orthopedics;  Laterality: Bilateral;   TENNIS ELBOW RELEASE/NIRSCHEL PROCEDURE Right 01/03/2018   Procedure: TENNIS ELBOW RELEASE/NIRSCHEL PROCEDURE;  Surgeon: Kathlynn Sharper, MD;  Location: [facility];  Service: Orthopedics;  Laterality: Right;   TONSILLECTOMY     UMBILICAL HERNIA REPAIR  08/22/2020   Procedure: HERNIA REPAIR UMBILICAL ADULT WITH MESH;  Surgeon: Tye Millet, DO;  Location: [facility];  Service: General;;    Prior to Admission medications   Medication Sig Start Date End Date Taking? Authorizing Provider  loperamide  (IMODIUM ) 2 MG capsule Take 1 capsule (2 mg total) by mouth as needed for diarrhea or loose stools. 12/19/23  Yes Wouk, Devaughn Sayres, MD  meclizine (ANTIVERT) 25 MG tablet Take 25 mg by mouth 3 (three) times daily as needed (vertigo).   Yes [provider]  ondansetron  (ZOFRAN -ODT) 4 MG disintegrating tablet Take 4 mg by mouth every 8 (eight) hours as needed for refractory nausea / vomiting. 12/12/23  Yes [provider]  sodium bicarbonate  650 MG tablet Take 1 tablet (650 mg total) by mouth 2 (two) times daily. 12/19/23  12/18/24 Yes Wouk, Devaughn Sayres, MD  tamsulosin  (FLOMAX ) 0.4 MG CAPS capsule Daily as needed to facilitate kidney stone passage 12/19/23  Yes Wouk, Devaughn Sayres, MD  Multiple Vitamin (MULTIVITAMIN WITH MINERALS) TABS tablet Take 1 tablet by mouth daily. Vita Pak 50 + Patient not taking: Reported on 01/11/2024    [provider]  pantoprazole  (PROTONIX ) 20 MG tablet Take 1 tablet (20 mg total) by mouth 2 (two) times daily. Patient not taking: Reported on 01/11/2024 05/31/23   Mansouraty, Aloha Raddle., MD    Family History  Problem Relation Age of Onset   Prostate cancer Father    Bladder Cancer Neg Hx    Kidney cancer Neg Hx    Colon cancer Neg Hx    Esophageal cancer Neg Hx    Inflammatory bowel disease Neg Hx    Liver disease Neg Hx    Rectal cancer Neg Hx    Stomach cancer Neg Hx    Pancreatic cancer Neg Hx      Social History   Tobacco Use   Smoking status: Former    Current  packs/day: 0.00    Average packs/day: 2.0 packs/day for 12.0 years (24.0 ttl pk-yrs)    Types: Cigarettes    Start date: 03/24/1974    Quit date: 03/24/1986    Years since quitting: 37.8   Smokeless tobacco: Never  Vaping Use   Vaping status: Never Used  Substance Use Topics   Alcohol use: Yes    Alcohol/week: 0.0 standard drinks of alcohol    Comment: rarely   Drug use: No    Allergies as of 01/10/2024 - Review Complete 01/10/2024  Allergen Reaction Noted   Penicillins Hives 06/30/2015   Contrast media [iodinated contrast media] Other (See Comments) 02/12/2019   Hydrocodone -acetaminophen  Itching 08/12/2020    Review of Systems:    All systems reviewed and negative except where noted in HPI.   Physical Exam:  Vital signs in last 24 hours: Temp:  [97.9 F (36.6 C)-98.4 F (36.9 C)] 97.9 F (36.6 C) (12/04 0805) Pulse Rate:  [60-81] 66 (12/04 0805) Resp:  [17-20] 17 (12/04 0805) BP: (113-128)/(58-82) 118/69 (12/04 0805) SpO2:  [96 %-99 %] 97 % (12/04 0805) Last BM Date :  01/11/24 General:   Pleasant, cooperative in NAD Head:  Normocephalic and atraumatic. Eyes:   No icterus.   Conjunctiva pink. PERRLA. Ears:  Normal auditory acuity. Neck:  Supple; no masses or thyroidomegaly Lungs: Respirations even and unlabored. Lungs clear to auscultation bilaterally.   No wheezes, crackles, or rhonchi.  Heart:  Regular rate and rhythm;  Without murmur, clicks, rubs or gallops Abdomen:  Soft, nondistended, nontender. Normal bowel sounds. No appreciable masses or hepatomegaly.  No rebound or guarding.  Rectal:  Not performed. Msk:  Symmetrical without gross deformities.   Extremities:  Without edema, cyanosis or clubbing. Neurologic:  Alert and oriented x3;  grossly normal neurologically. Skin:  Intact without significant lesions or rashes. Cervical Nodes:  No significant cervical adenopathy. Psych:  Alert and cooperative. Normal affect.  LAB RESULTS: Recent Labs    01/10/24 1157 01/10/24 1754 01/11/24 0533  WBC 8.3 5.7 5.8  HGB 17.2* 15.0 14.5  HCT 50.9 44.5 42.1  PLT 286 232 241   BMET Recent Labs    01/10/24 1157 01/10/24 1754 01/11/24 0533 01/12/24 0517  NA 138  --  137 139  K 4.2  --  4.1 3.9  CL 109  --  110 113*  CO2 15*  --  18* 17*  GLUCOSE 104*  --  86 88  BUN 19  --  17 13  CREATININE 2.08* 1.56* 1.51* 1.14  CALCIUM 9.6  --  8.5* 8.6*   LFT Recent Labs    01/11/24 0533  PROT 5.9*  ALBUMIN 3.4*  AST 59*  ALT 108*  ALKPHOS 84  BILITOT 0.7   PT/INR No results for input(s): LABPROT, INR in the last 72 hours.  STUDIES: No results found.    Impression / Plan:   Assessment: Principal Problem:   Diarrhea   Jonathan Hammers. is a 67 y.o. y/o male with with a history of over a month of nausea and vomiting with 4 episodes of  vomiting with profuse diarrhea for the last month.  He denies any bloody stools.  He feels like the Imodium  is not resolving his issues.  The patient was noted to have abnormal liver enzymes and  ultrasound showed him to have fatty liver.  His total bilirubin is normal.  I am now being asked to see the patient for his nausea vomiting and diarrhea. The patient's  abnormal liver enzymes are likely due to his fatty liver disease.  Plan:  The patient has been told that his symptoms can be caused by microscopic lightest versus ulcerative colitis versus postinfectious irritable bowel syndrome.  The patient will be set up for an EGD and colonoscopy for the diarrhea and his vomiting.  The patient has been told that the pathology may not be back prior to him going home but we need to start somewhere.  The patient will have the colonoscopy and EGD tomorrow.  The patient has been explained the plan and agrees with it.  Thank you for involving me in the care of this patient.      LOS: 2 days   Jonathan Copping, MD, MD. NOLIA 01/12/2024, 3:08 PM,  Pager [phone] 7am-5pm  Check AMION for 5pm -7am coverage and on weekends   Note: This dictation was prepared with Dragon dictation along with smaller phrase technology. Any transcriptional errors that result from this process are unintentional.

## 2024-01-12 NOTE — Plan of Care (Signed)

## 2024-01-12 NOTE — Progress Notes (Signed)
 PROGRESS NOTE Jonathan Andrews.    DOB: 1956/02/03, 68 y.o.  FMW:[id number]    Code Status: Full Code   DOA: 01/10/2024   LOS: 2  Brief hospital course  Jonathan Andrews. is a 68 y.o. male with a PMH significant for HTN, with recent admission from 11/7-11/10 for evaluation of diarrhea presents to the emergency department for evaluation of ongoing diarrhea. About a month of fluctuating frequency of loose Bms mostly post-prandial. Has gas and colicky abdominal pain relieved with Bms. Cdiff testing negative multiple times and did receive abx at one time. Also had vomiting at one time.  Imodium  not effective.   01/12/24: continues to have about 4-5 loose Bms per day, non-bloody. Had 2 episodes of vomiting yesterday. No abdominal pain. AKI has resolved.  Continue probiotics GI consulted. Tentatively planning EGD and colonoscopy tomorrow to differentiate possible causes.   Assessment & Plan  Principal Problem:   Diarrhea  Acute to sub acute diarrhea- based on thorough history taking, my suspicion is that he originally had viral gastroenteritis about a month ago and has not had good recovery of his microbiome since infection, made worse with antibiotic course. At risk of cdiff but testing negative twice. No leukocytosis or fever. Low suspicion for ongoing infection.  - GI consulted to help differentiate inflammatory conditions vs IBS.   - EGD, colonoscopy tomorrow am - continue probiotics.  - continue imodium  PRN - stool PCR negative - f/u fecal calprotectin  Dehydration 2/2 ongoing diarrhea AKI- resolved after IVF . Cr peaked at 2.08>1.51>1.14 - oral hydration for now - hold antihypertensives  - low fiber diet    HTN  - hold antihypertensives   Body mass index is 28.85 kg/m.  VTE ppx: heparin injection 5,000 Units Start: 01/10/24 1800  Diet:     Diet   DIET SOFT Room service appropriate? Yes; Fluid consistency: Thin   Consultants: GI  Subjective 01/12/24    Pt reports  no abdominal pain currently. Had BM at about 330 this morning and after breakfast   Objective  Blood pressure 121/74, pulse 67, temperature 97.8 F (36.6 C), resp. rate 18, height 6' 4 (1.93 m), weight 107.5 kg, SpO2 98%.  Intake/Output Summary (Last 24 hours) at 01/12/2024 0752 Last data filed at 01/12/2024 0523 Gross per 24 hour  Intake 770 ml  Output 301 ml  Net 469 ml   Filed Weights   01/10/24 1149  Weight: 107.5 kg    Physical Exam:  General: awake, alert, NAD HEENT: atraumatic, clear conjunctiva, anicteric sclera, MMM, hearing grossly normal Respiratory: normal respiratory effort. Cardiovascular: extremities well perfused, quick capillary refill, normal S1/S2, RRR, no JVD, murmurs Gastrointestinal: soft, NT, ND Nervous: A&O x3. no gross focal neurologic deficits, normal speech Extremities: moves all equally, no edema, normal tone Skin: dry, intact, normal temperature, normal color. No rashes, lesions or ulcers on exposed skin Psychiatry: normal mood, congruent affect  Labs   I have personally reviewed the following labs and imaging studies CBC    Component Value Date/Time   WBC 5.8 01/11/2024 0533   RBC 4.34 01/11/2024 0533   HGB 14.5 01/11/2024 0533   HGB 15.2 06/10/2012 2038   HCT 42.1 01/11/2024 0533   HCT 44.0 06/10/2012 2038   PLT 241 01/11/2024 0533   PLT 202 06/10/2012 2038   MCV 97.0 01/11/2024 0533   MCV 96 06/10/2012 2038   MCH 33.4 01/11/2024 0533   MCHC 34.4 01/11/2024 0533   RDW 12.5 01/11/2024 0533  RDW 12.5 06/10/2012 2038   LYMPHSABS 1.4 12/19/2023 0625   MONOABS 0.7 12/19/2023 0625   EOSABS 0.1 12/19/2023 0625   BASOSABS 0.0 12/19/2023 0625      Latest Ref Rng & Units 01/12/2024    5:17 AM 01/11/2024    5:33 AM 01/10/2024    5:54 PM  BMP  Glucose 70 - 99 mg/dL 88  86    BUN 8 - 23 mg/dL 13  17    Creatinine 9.38 - 1.24 mg/dL 8.85  8.48  8.43   Sodium 135 - 145 mmol/L 139  137    Potassium 3.5 - 5.1 mmol/L 3.9  4.1    Chloride 98 -  111 mmol/L 113  110    CO2 22 - 32 mmol/L 17  18    Calcium 8.9 - 10.3 mg/dL 8.6  8.5      No results found.  Disposition Plan & Communication  Patient status: Inpatient  Admitted From: Home Planned disposition location: Home Anticipated discharge date: 12/5 pending GI workup  Family Communication: wife at bedside    Author: Marien LITTIE Piety, DO [facility] 01/12/2024, 7:52 AM   Available by Epic secure chat 7AM-7PM. If 7PM-7AM, please contact night-coverage.  [facility] contact information found on [email or web address].

## 2024-01-13 ENCOUNTER — Encounter: Payer: Self-pay | Admitting: Emergency Medicine

## 2024-01-13 ENCOUNTER — Inpatient Hospital Stay: Admitting: Anesthesiology

## 2024-01-13 ENCOUNTER — Encounter: Admission: EM | Disposition: A | Payer: Self-pay | Source: Home / Self Care | Attending: Emergency Medicine

## 2024-01-13 DIAGNOSIS — K633 Ulcer of intestine: Secondary | ICD-10-CM

## 2024-01-13 DIAGNOSIS — K222 Esophageal obstruction: Secondary | ICD-10-CM | POA: Diagnosis not present

## 2024-01-13 DIAGNOSIS — K2980 Duodenitis without bleeding: Secondary | ICD-10-CM

## 2024-01-13 DIAGNOSIS — K529 Noninfective gastroenteritis and colitis, unspecified: Secondary | ICD-10-CM | POA: Diagnosis not present

## 2024-01-13 DIAGNOSIS — K269 Duodenal ulcer, unspecified as acute or chronic, without hemorrhage or perforation: Secondary | ICD-10-CM

## 2024-01-13 DIAGNOSIS — N179 Acute kidney failure, unspecified: Secondary | ICD-10-CM

## 2024-01-13 HISTORY — PX: COLONOSCOPY: SHX5424

## 2024-01-13 HISTORY — PX: ESOPHAGOGASTRODUODENOSCOPY: SHX5428

## 2024-01-13 LAB — COMPREHENSIVE METABOLIC PANEL WITH GFR
ALT: 107 U/L — ABNORMAL HIGH (ref 0–44)
AST: 61 U/L — ABNORMAL HIGH (ref 15–41)
Albumin: 3.4 g/dL — ABNORMAL LOW (ref 3.5–5.0)
Alkaline Phosphatase: 80 U/L (ref 38–126)
Anion gap: 10 (ref 5–15)
BUN: 14 mg/dL (ref 8–23)
CO2: 20 mmol/L — ABNORMAL LOW (ref 22–32)
Calcium: 8.7 mg/dL — ABNORMAL LOW (ref 8.9–10.3)
Chloride: 111 mmol/L (ref 98–111)
Creatinine, Ser: 1.18 mg/dL (ref 0.61–1.24)
GFR, Estimated: >60 mL/min (ref >60)
Glucose, Bld: 88 mg/dL (ref 70–99)
Potassium: 3.8 mmol/L (ref 3.5–5.1)
Sodium: 141 mmol/L (ref 135–145)
Total Bilirubin: 0.5 mg/dL (ref 0.0–1.2)
Total Protein: 5.7 g/dL — ABNORMAL LOW (ref 6.5–8.1)

## 2024-01-13 LAB — CBC
HCT: 41.6 % (ref 39.0–52.0)
Hemoglobin: 13.9 g/dL (ref 13.0–17.0)
MCH: 32.6 pg (ref 26.0–34.0)
MCHC: 33.4 g/dL (ref 30.0–36.0)
MCV: 97.4 fL (ref 80.0–100.0)
Platelets: 233 K/uL (ref 150–400)
RBC: 4.27 MIL/uL (ref 4.22–5.81)
RDW: 12.8 % (ref 11.5–15.5)
WBC: 5.7 K/uL (ref 4.0–10.5)
nRBC: 0 % (ref 0.0–0.2)

## 2024-01-13 LAB — CALPROTECTIN, FECAL: Calprotectin, Fecal: 905 ug/g — ABNORMAL HIGH (ref 0–120)

## 2024-01-13 SURGERY — EGD (ESOPHAGOGASTRODUODENOSCOPY)
Anesthesia: General

## 2024-01-13 MED ORDER — GLYCOPYRROLATE 0.2 MG/ML IJ SOLN
INTRAMUSCULAR | Status: AC
  Filled 2024-01-13: qty 1

## 2024-01-13 MED ORDER — EPHEDRINE 5 MG/ML INJ
INTRAVENOUS | Status: AC
  Filled 2024-01-13: qty 5

## 2024-01-13 MED ORDER — LIDOCAINE HCL (CARDIAC) PF 100 MG/5ML IV SOSY
PREFILLED_SYRINGE | INTRAVENOUS | Status: DC | PRN
  Administered 2024-01-13: 100 mg via INTRAVENOUS

## 2024-01-13 MED ORDER — EPHEDRINE SULFATE (PRESSORS) 25 MG/5ML IV SOSY
PREFILLED_SYRINGE | INTRAVENOUS | Status: DC | PRN
  Administered 2024-01-13 (×2): 5 mg via INTRAVENOUS

## 2024-01-13 MED ORDER — LOPERAMIDE HCL 2 MG PO CAPS: 2.0000 mg | ORAL_CAPSULE | ORAL | 0 refills | Status: DC | PRN

## 2024-01-13 MED ORDER — LOPERAMIDE HCL 2 MG PO CAPS: 2.0000 mg | ORAL_CAPSULE | ORAL | Status: DC | PRN

## 2024-01-13 MED ORDER — TAMSULOSIN HCL 0.4 MG PO CAPS
0.4000 mg | ORAL_CAPSULE | Freq: Every day | ORAL | Status: DC
  Filled 2024-01-13: qty 1

## 2024-01-13 MED ORDER — PROPOFOL 500 MG/50ML IV EMUL
INTRAVENOUS | Status: DC | PRN
  Administered 2024-01-13: 120 ug/kg/min via INTRAVENOUS
  Administered 2024-01-13: 100 mg via INTRAVENOUS

## 2024-01-13 MED ORDER — DEXMEDETOMIDINE HCL IN NACL 80 MCG/20ML IV SOLN
INTRAVENOUS | Status: DC | PRN
  Administered 2024-01-13: 8 ug via INTRAVENOUS

## 2024-01-13 MED ORDER — RISAQUAD PO CAPS: 1.0000 | ORAL_CAPSULE | Freq: Two times a day (BID) | ORAL | 0 refills | Status: AC

## 2024-01-13 NOTE — Progress Notes (Signed)
 The patient had an EGD and colonoscopy to look for the source of the patient's nausea vomiting diarrhea.  The patient's procedures were normal but biopsies were taken both of the small bowel and the colon.  The patient had no signs of inflammation or infection. The patient should follow-up with his primary GI doctor as an outpatient if the diarrhea nausea vomiting continues.  I will sign off.  Please call if any further GI concerns or questions.  We would like to thank you for the opportunity to participate in the care of Jonathan Andrews

## 2024-01-13 NOTE — Transfer of Care (Signed)
 Immediate Anesthesia Transfer of Care Note  Patient: Jonathan Andrews.  Procedure(s) Performed: EGD (ESOPHAGOGASTRODUODENOSCOPY) COLONOSCOPY  Patient Location: PACU and Endoscopy Unit  Anesthesia Type:General  Level of Consciousness: drowsy  Airway & Oxygen Therapy: Patient Spontanous Breathing  Post-op Assessment: Report given to RN and Post -op Vital signs reviewed and stable  Post vital signs: Reviewed and stable  Last Vitals:  Vitals Value Taken Time  BP 102/47 01/13/24 12:04  Temp 36.1 C 01/13/24 12:04  Pulse 62 01/13/24 12:11  Resp 21 01/13/24 12:11  SpO2 95 % 01/13/24 12:11  Vitals shown include unfiled device data.  Last Pain:  Vitals:   01/13/24 1204  TempSrc: Temporal  PainSc: 0-No pain         Complications: No notable events documented.

## 2024-01-13 NOTE — Discharge Summary (Signed)
 Physician Discharge Summary   Patient: Jonathan Andrews. MRN: [id number] DOB: 03-09-1955  Admit date:     01/10/2024  Discharge date: 01/13/24  Discharge Physician: Jonathan Andrews   PCP: Jonathan Lynwood FALCON, MD   Recommendations at discharge:    F/u Jonathan Andrews GI Jonathan Andrews for diarrhea dn colon biopsy results F/u PCP in 1-2 weeks  Discharge Diagnoses: Principal Problem:   Diarrhea Active Problems:   Nausea and vomiting  ichard E Jonathan Andrews. is a 68 y.o. male with a PMH significant for HTN, with recent admission from 11/7-11/10 for evaluation of diarrhea presents to the emergency department for evaluation of ongoing diarrhea. About a month of fluctuating frequency of loose Bms mostly post-prandial. Has gas and colicky abdominal pain relieved with Bms. Cdiff testing negative multiple times and did receive abx at one time.   Acute to sub acute diarrhea- based on thorough history taking, my suspicion is that he originally had viral gastroenteritis about a month ago and has not had good recovery of his microbiome since infection, made worse with antibiotic course. At risk of cdiff but testing negative twice. No leukocytosis or fever. Low suspicion for ongoing infection.  - GI consulted to help differentiate inflammatory conditions vs IBS.   - EGD--  A large, submucosal lesion with no bleeding and no stigmata of recent       bleeding was found in the upper third of the esophagus. The mass was       partially obstructing and partially circumferential (involving two       thirds of the lumen circumference).      The stomach was normal.      The examined duodenum was normal. Biopsies were taken with a cold       forceps for histology. --colonoscopy The perianal and digital rectal examinations were normal.      The terminal ileum appeared normal. Biopsies were taken with a cold       forceps for histology.      A moderate amount of semi-liquid stool was found in the entire colon.       Random  biopsies were obtained with cold forceps for histology randomly. - continue probiotics.  - continue imodium  PRN - stool PCR negative -  fecal calprotectin ~900 --pt to f/u Jonathan Andrews GI as out pt   Dehydration 2/2 ongoing diarrhea AKI- resolved after IVF . Cr peaked at 2.08>1.51>1.14 - oral hydration for now - hold antihypertensives  - low fiber diet    HTN  - not on BP meds --bp soft. Tolerating well  Overall hemodynamically stable. Discharge plan discussed with patient and wife at bedside. Okay from G.I. to discharge     Consultants: G.I. Procedures performed: EGD colonoscopy Disposition: Home Diet recommendation:  Discharge Diet Orders (From admission, onward)     Start     Ordered   01/13/24 0000  Diet - low sodium heart healthy        01/13/24 1424           Regular diet DISCHARGE MEDICATION: Allergies as of 01/13/2024       Reactions   Penicillins Hives   Has patient had a PCN reaction causing immediate rash, facial/tongue/throat swelling, SOB or lightheadedness with hypotension: No Has patient had a PCN reaction causing severe rash involving mucus membranes or skin necrosis: No Has patient had a PCN reaction that required hospitalization: No Has patient had a PCN reaction occurring within the last 10 years: Unknown If all  of the above answers are NO, then may proceed with Cephalosporin use.   Contrast Media [iodinated Contrast Media] Other (See Comments)   migraine    Hydrocodone -acetaminophen  Itching   No rash        Medication List     TAKE these medications    acidophilus Caps capsule Take 1 capsule by mouth 2 (two) times daily.   loperamide  2 MG capsule Commonly known as: IMODIUM  Take 1 capsule (2 mg total) by mouth as needed for diarrhea or loose stools.   meclizine 25 MG tablet Commonly known as: ANTIVERT Take 25 mg by mouth 3 (three) times daily as needed (vertigo).   ondansetron  4 MG disintegrating tablet Commonly known as:  ZOFRAN -ODT Take 4 mg by mouth every 8 (eight) hours as needed for refractory nausea / vomiting.   sodium bicarbonate  650 MG tablet Take 1 tablet (650 mg total) by mouth 2 (two) times daily.   tamsulosin  0.4 MG Caps capsule Commonly known as: FLOMAX  Daily as needed to facilitate kidney stone passage        Follow-up Information     Jonathan Lynwood FALCON, MD Follow up.   Specialty: Family Medicine Why: hospital follow up Contact information: [address] [facility] [place] [address] [phone]                Discharge Exam: Jonathan Andrews   01/10/24 1149  Weight: 107.5 kg   Alert and oriented times three respiratory clear to auscultation cardiovascular both heart sounds normal no murmur abdomen soft benign nontender neuro- grossly intact  Condition at discharge: fair  The results of significant diagnostics from this hospitalization (including imaging, microbiology, ancillary and laboratory) are listed below for reference.   Imaging Studies: US  ABDOMEN LIMITED RUQ (LIVER/GB) Result Date: 12/16/2023 EXAM: Right Upper Quadrant Abdominal Ultrasound 12/16/2023 12:50:00 AM TECHNIQUE: Real-time ultrasonography of the right upper quadrant of the abdomen was performed. COMPARISON: Comparison with CT 12/15/2023. CLINICAL HISTORY: RUQ pain. FINDINGS: LIVER: The liver demonstrates hepatic steatosis. Patent portal vein with antegrade flow. No intrahepatic biliary ductal dilatation. No evidence of mass. BILIARY SYSTEM: No pericholecystic fluid or wall thickening. No cholelithiasis. Negative sonographic Murphy's sign. Common bile duct measures 4 mm. OTHER: No right upper quadrant ascites. IMPRESSION: 1. Hepatic steatosis. 2. No acute abnormality. Electronically signed by: Jonathan Gatlin MD 12/16/2023 01:25 AM EST RP Workstation: [id number]   CT ABDOMEN PELVIS WO CONTRAST Result Date: 12/15/2023 CLINICAL DATA:  Diverticulitis, persistent abdominal pain, diarrhea EXAM: CT  ABDOMEN AND PELVIS WITHOUT CONTRAST TECHNIQUE: Multidetector CT imaging of the abdomen and pelvis was performed following the standard protocol without IV contrast. RADIATION DOSE REDUCTION: This exam was performed according to the departmental dose-optimization program which includes automated exposure control, adjustment of the mA and/or kV according to patient size and/or use of iterative reconstruction technique. COMPARISON:  08/15/2020 FINDINGS: Lower chest: No acute pleural or parenchymal lung disease. Hepatobiliary: Unremarkable unenhanced appearance of the liver and gallbladder. No biliary duct dilation. Pancreas: Unremarkable unenhanced appearance. Spleen: Unremarkable unenhanced appearance. Adrenals/Urinary Tract: Bilateral renal cortical cysts do not require specific imaging follow-up. Nonobstructing 7 mm calculus lower pole right kidney. No left-sided calculi. No obstructive uropathy within either kidney. The adrenals and bladder are unremarkable. Stomach/Bowel: No bowel obstruction or ileus. Scattered gas fluid levels throughout the colon consistent with diarrhea or recent laxative/enema use. Normal appendix right lower quadrant. No bowel wall thickening or inflammatory change. Vascular/Lymphatic: Aortic atherosclerosis. No enlarged abdominal or pelvic lymph nodes. Reproductive: Prostate is  unremarkable. Other: No free fluid or free intraperitoneal gas. No abdominal wall hernia. Musculoskeletal: No acute or destructive bony abnormalities. Reconstructed images demonstrate no additional findings. IMPRESSION: 1. Colonic gas fluid levels consistent with diarrhea or recent laxative/enema use. No obstruction or ileus. 2. Nonobstructing 7 mm right renal calculus. 3.  Aortic Atherosclerosis (ICD10-I70.0). Electronically Signed   By: Ozell Daring M.D.   On: 12/15/2023 23:54    Microbiology: Results for orders placed or performed during the hospital encounter of 01/10/24  C Difficile Quick Screen w PCR  reflex     Status: None   Collection Time: 01/10/24  1:54 PM  Result Value Ref Range Status   C Diff antigen NEGATIVE NEGATIVE Final   C Diff toxin NEGATIVE NEGATIVE Final   C Diff interpretation No C. difficile detected.  Final    Comment: Performed at [facility], [address] [place], [place] [address]  Gastrointestinal Panel by PCR , Stool     Status: None   Collection Time: 01/10/24  3:32 PM   Specimen: Stool  Result Value Ref Range Status   Campylobacter species NOT DETECTED NOT DETECTED Final   Plesimonas shigelloides NOT DETECTED NOT DETECTED Final   Salmonella species NOT DETECTED NOT DETECTED Final   Yersinia enterocolitica NOT DETECTED NOT DETECTED Final   Vibrio species NOT DETECTED NOT DETECTED Final   Vibrio cholerae NOT DETECTED NOT DETECTED Final   Enteroaggregative E coli (EAEC) NOT DETECTED NOT DETECTED Final   Enteropathogenic E coli (EPEC) NOT DETECTED NOT DETECTED Final   Enterotoxigenic E coli (ETEC) NOT DETECTED NOT DETECTED Final   Shiga like toxin producing E coli (STEC) NOT DETECTED NOT DETECTED Final   Shigella/Enteroinvasive E coli (EIEC) NOT DETECTED NOT DETECTED Final   Cryptosporidium NOT DETECTED NOT DETECTED Final   Cyclospora cayetanensis NOT DETECTED NOT DETECTED Final   Entamoeba histolytica NOT DETECTED NOT DETECTED Final   Giardia lamblia NOT DETECTED NOT DETECTED Final   Adenovirus F40/41 NOT DETECTED NOT DETECTED Final   Astrovirus NOT DETECTED NOT DETECTED Final   Norovirus GI/GII NOT DETECTED NOT DETECTED Final   Rotavirus A NOT DETECTED NOT DETECTED Final   Sapovirus (I, II, IV, and V) NOT DETECTED NOT DETECTED Final    Comment: Performed at [facility], [address] [place], [place] [address]  C Difficile Quick Screen w PCR reflex     Status: None   Collection Time: 01/10/24  3:32 PM   Specimen: STOOL  Result Value Ref Range Status   C Diff antigen NEGATIVE NEGATIVE Final   C Diff toxin NEGATIVE NEGATIVE  Final   C Diff interpretation No C. difficile detected.  Final    Comment: Performed at [facility], [address] [place], [place] [address]  Gastrointestinal Panel by PCR , Stool     Status: None   Collection Time: 01/11/24  1:54 PM   Specimen: Stool  Result Value Ref Range Status   Campylobacter species NOT DETECTED NOT DETECTED Final   Plesimonas shigelloides NOT DETECTED NOT DETECTED Final   Salmonella species NOT DETECTED NOT DETECTED Final   Yersinia enterocolitica NOT DETECTED NOT DETECTED Final   Vibrio species NOT DETECTED NOT DETECTED Final   Vibrio cholerae NOT DETECTED NOT DETECTED Final   Enteroaggregative E coli (EAEC) NOT DETECTED NOT DETECTED Final   Enteropathogenic E coli (EPEC) NOT DETECTED NOT DETECTED Final   Enterotoxigenic E coli (ETEC) NOT DETECTED NOT DETECTED Final   Shiga like toxin producing E  coli (STEC) NOT DETECTED NOT DETECTED Final   Shigella/Enteroinvasive E coli (EIEC) NOT DETECTED NOT DETECTED Final   Cryptosporidium NOT DETECTED NOT DETECTED Final   Cyclospora cayetanensis NOT DETECTED NOT DETECTED Final   Entamoeba histolytica NOT DETECTED NOT DETECTED Final   Giardia lamblia NOT DETECTED NOT DETECTED Final   Adenovirus F40/41 NOT DETECTED NOT DETECTED Final   Astrovirus NOT DETECTED NOT DETECTED Final   Norovirus GI/GII NOT DETECTED NOT DETECTED Final   Rotavirus A NOT DETECTED NOT DETECTED Final   Sapovirus (I, II, IV, and V) NOT DETECTED NOT DETECTED Final    Comment: Performed at [facility], [address] [place], [place] [address]  Calprotectin, Fecal     Status: Abnormal   Collection Time: 01/11/24  1:54 PM  Result Value Ref Range Status   Calprotectin, Fecal 905 (H) 0 - 120 ug/g Final    Comment: (NOTE) **Results verified by repeat testing** Concentration     Interpretation   Follow-Up < 5 - 50 ug/g     Normal           None >50 -120 ug/g     Borderline       Re-evaluate in 4-6 weeks    >120 ug/g      Abnormal         Repeat as clinically                                   indicated Performed At: [facility] [address] [place], [place] [id number] Jennette Shorter MD Ey:[phone]     Labs: CBC: Recent Labs  Lab 01/10/24 1157 01/10/24 1754 01/11/24 0533 01/13/24 0616  WBC 8.3 5.7 5.8 5.7  HGB 17.2* 15.0 14.5 13.9  HCT 50.9 44.5 42.1 41.6  MCV 97.3 98.5 97.0 97.4  PLT 286 232 241 233   Basic Metabolic Panel: Recent Labs  Lab 01/10/24 1157 01/10/24 1754 01/11/24 0533 01/12/24 0517 01/13/24 0616  NA 138  --  137 139 141  K 4.2  --  4.1 3.9 3.8  CL 109  --  110 113* 111  CO2 15*  --  18* 17* 20*  GLUCOSE 104*  --  86 88 88  BUN 19  --  17 13 14   CREATININE 2.08* 1.56* 1.51* 1.14 1.18  CALCIUM 9.6  --  8.5* 8.6* 8.7*   Liver Function Tests: Recent Labs  Lab 01/10/24 1157 01/11/24 0533 01/13/24 0616  AST 77* 59* 61*  ALT 151* 108* 107*  ALKPHOS 111 84 80  BILITOT 0.9 0.7 0.5  PROT 7.5 5.9* 5.7*  ALBUMIN 4.3 3.4* 3.4*    Discharge time spent: greater than 30 minutes.  Signed: Leita Blanch, MD [facility] 01/13/2024

## 2024-01-13 NOTE — Op Note (Signed)
 [facility] Patient Name: Jonathan Andrews Procedure Date: 01/13/2024 11:35 AM MRN: [id number] Account #: [id number] Date of Birth: 08-Aug-1955 Admit Type: Inpatient Age: 68 Room: [facility] ENDO ROOM 4 Gender: Male Note Status: Finalized Instrument Name: Colon Scope [id number] Procedure:             Colonoscopy Indications:           Chronic diarrhea Providers:             Rogelia Copping MD, MD Referring MD:          valora agent Medicines:             Propofol  per Anesthesia Complications:         No immediate complications. Procedure:             Pre-Anesthesia Assessment:                        - Prior to the procedure, a History and Physical was                         performed, and patient medications and allergies were                         reviewed. The patient's tolerance of previous                         anesthesia was also reviewed. The risks and benefits                         of the procedure and the sedation options and risks                         were discussed with the patient. All questions were                         answered, and informed consent was obtained. Prior                         Anticoagulants: The patient has taken no anticoagulant                         or antiplatelet agents. ASA Grade Assessment: II - A                         patient with mild systemic disease. After reviewing                         the risks and benefits, the patient was deemed in                         satisfactory condition to undergo the procedure.                        After obtaining informed consent, the colonoscope was                         passed under direct vision. Throughout the procedure,  the patient's blood pressure, pulse, and oxygen                         saturations were monitored continuously. The                         Colonoscope was introduced through the anus and                         advanced  to the the terminal ileum. The colonoscopy                         was performed without difficulty. The patient                         tolerated the procedure well. The quality of the bowel                         preparation was adequate to identify polyps greater                         than 5 mm in size. Findings:      The perianal and digital rectal examinations were normal.      The terminal ileum appeared normal. Biopsies were taken with a cold       forceps for histology.      A moderate amount of semi-liquid stool was found in the entire colon.       Random biopsies were obtained with cold forceps for histology randomly. Impression:            - The examined portion of the ileum was normal.                         Biopsied.                        - Stool in the entire examined colon.                        - Random biopsies were obtained. Recommendation:        - Return patient to hospital ward for ongoing care.                        - Resume previous diet.                        - Continue present medications.                        - Await pathology results.                        - Patient to follow up with his primary GI doctor. Procedure Code(s):     --- Professional ---                        [phone], Colonoscopy, flexible; with biopsy, single or                         multiple Diagnosis Code(s):     ---  Professional ---                        K52.9, Noninfective gastroenteritis and colitis,                         unspecified CPT copyright 2022 American Medical Association. All rights reserved. The codes documented in this report are preliminary and upon coder review may  be revised to meet current compliance requirements. Rogelia Copping MD, MD 01/13/2024 12:05:11 PM This report has been signed electronically. Number of Addenda: 0 Note Initiated On: 01/13/2024 11:35 AM Scope Withdrawal Time: 0 hours 4 minutes 49 seconds  Total Procedure Duration: 0 hours 7 minutes 4  seconds  Estimated Blood Loss:  Estimated blood loss: none.      [facility]

## 2024-01-13 NOTE — Anesthesia Postprocedure Evaluation (Signed)
 Anesthesia Post Note  Patient: Jonathan Andrews.  Procedure(s) Performed: EGD (ESOPHAGOGASTRODUODENOSCOPY) COLONOSCOPY  Patient location during evaluation: Endoscopy Anesthesia Type: General Level of consciousness: awake and alert Pain management: pain level controlled Vital Signs Assessment: post-procedure vital signs reviewed and stable Respiratory status: spontaneous breathing, nonlabored ventilation, respiratory function stable and patient connected to nasal cannula oxygen Cardiovascular status: blood pressure returned to baseline and stable Postop Assessment: no apparent nausea or vomiting Anesthetic complications: no   No notable events documented.   Last Vitals:  Vitals:   01/13/24 1224 01/13/24 1234  BP: (!) 106/49 113/73  Pulse: 63 66  Resp: 14 10  Temp:    SpO2: 97% 99%    Last Pain:  Vitals:   01/13/24 1234  TempSrc:   PainSc: 0-No pain                 Prentice Murphy

## 2024-01-13 NOTE — Plan of Care (Signed)

## 2024-01-13 NOTE — Op Note (Signed)
 [facility] Patient Name: Jonathan Andrews Procedure Date: 01/13/2024 11:35 AM MRN: [id number] Account #: [id number] Date of Birth: [birth year] Admit Type: Inpatient Age: 68 Room: [facility] ENDO ROOM 4 Gender: Male Note Status: Finalized Instrument Name: Upper GI Scope [id number] Procedure:             Upper GI endoscopy Indications:           Nausea with vomiting Providers:             Rogelia Copping MD, MD Referring MD:          Lynwood FALCON. Valora, MD (Referring MD) Medicines:             Propofol  per Anesthesia Complications:         No immediate complications. Procedure:             Pre-Anesthesia Assessment:                        - Prior to the procedure, a History and Physical was                         performed, and patient medications and allergies were                         reviewed. The patient's tolerance of previous                         anesthesia was also reviewed. The risks and benefits                         of the procedure and the sedation options and risks                         were discussed with the patient. All questions were                         answered, and informed consent was obtained. Prior                         Anticoagulants: The patient has taken no anticoagulant                         or antiplatelet agents. ASA Grade Assessment: II - A                         patient with mild systemic disease. After reviewing                         the risks and benefits, the patient was deemed in                         satisfactory condition to undergo the procedure.                        After obtaining informed consent, the endoscope was                         passed under direct vision. Throughout the procedure,  the patient's blood pressure, pulse, and oxygen                         saturations were monitored continuously. The Endoscope                         was introduced through the mouth, and  advanced to the                         second part of duodenum. The upper GI endoscopy was                         accomplished without difficulty. The patient tolerated                         the procedure well. Findings:      A large, submucosal lesion with no bleeding and no stigmata of recent       bleeding was found in the upper third of the esophagus. The mass was       partially obstructing and partially circumferential (involving two       thirds of the lumen circumference).      The stomach was normal.      The examined duodenum was normal. Biopsies were taken with a cold       forceps for histology. Impression:            - Partially obstructing esophageal lesion was found in                         the upper third of the esophagus.                        - Normal stomach.                        - Normal examined duodenum. Biopsied. Recommendation:        - Return patient to hospital ward for ongoing care.                        - Resume previous diet.                        - Continue present medications.                        - Await pathology results.                        - Perform a colonoscopy today. Procedure Code(s):     --- Professional ---                        [phone], Esophagogastroduodenoscopy, flexible,                         transoral; with biopsy, single or multiple Diagnosis Code(s):     --- Professional ---                        R11.2, Nausea with vomiting, unspecified CPT copyright 2022 American Medical Association. All rights reserved. The codes documented  in this report are preliminary and upon coder review may  be revised to meet current compliance requirements. Rogelia Copping MD, MD 01/13/2024 11:51:06 AM This report has been signed electronically. Number of Addenda: 0 Note Initiated On: 01/13/2024 11:35 AM Estimated Blood Loss:  Estimated blood loss: none.      [facility]

## 2024-01-13 NOTE — Anesthesia Preprocedure Evaluation (Signed)
 Anesthesia Evaluation  Patient identified by MRN, date of birth, ID band Patient awake    Reviewed: Allergy & Precautions, NPO status , Patient's Chart, lab work & pertinent test results  History of Anesthesia Complications Negative for: history of anesthetic complications  Airway Mallampati: III  TM Distance: >3 FB Neck ROM: full    Dental  (+) Chipped, Dental Advidsory Given,    Pulmonary neg pulmonary ROS, former smoker   Pulmonary exam normal        Cardiovascular Exercise Tolerance: Good hypertension, Pt. on medications (-) angina (-) Past MI and (-) Cardiac Stents Normal cardiovascular exam(-) dysrhythmias (-) Valvular Problems/Murmurs     Neuro/Psych negative neurological ROS  negative psych ROS   GI/Hepatic negative GI ROS, Neg liver ROS,neg GERD  ,,  Endo/Other  negative endocrine ROS    Renal/GU Renal disease (kidney stones)     Musculoskeletal   Abdominal Umbilical hernia  Peds  Hematology negative hematology ROS (+)   Anesthesia Other Findings Past Medical History: No date: Allergy No date: Bladder stones No date: GERD (gastroesophageal reflux disease) No date: Hemorrhoids No date: History of kidney stones No date: Hypertension  Past Surgical History: 08/15/2020: COLONOSCOPY; N/A     Comment:  Procedure: COLONOSCOPY;  Surgeon: Legrand Victory LITTIE DOUGLAS,               MD;  [facility]: [facility];  Service: Gastroenterology;               Laterality: N/A; 03/27/2019: CYSTOSCOPY WITH LITHOLAPAXY; N/A     Comment:  Procedure: CYSTOSCOPY WITH LITHOLAPAXY;  Surgeon:               Twylla Glendia BROCKS, MD;  Location: [facility];  Service:               Urology;  Laterality: N/A; 04/17/2020: EVALUATION UNDER ANESTHESIA WITH HEMORRHOIDECTOMY; N/A     Comment:  Procedure: HEMORRHOIDECTOMY;  Surgeon: Dasie Leonor LITTIE,               MD;  [facility]: [facility];  Service: General;  Laterality:               N/A; 08/02/2019:  EXTRACORPOREAL SHOCK WAVE LITHOTRIPSY; Left     Comment:  Procedure: EXTRACORPOREAL SHOCK WAVE LITHOTRIPSY (ESWL);              Surgeon: Twylla Glendia BROCKS, MD;  Location: [facility];                Service: Urology;  Laterality: Left; No date: HAND SURGERY No date: HAND SURGERY; Bilateral 04/17/2020: RECTAL EXAM UNDER ANESTHESIA     Comment:  Procedure: RECTAL EXAM UNDER ANESTHESIA;  Surgeon:               Dasie Leonor LITTIE, MD;  Location: [facility];  Service:               General;; 04/14/2017: TENNIS ELBOW RELEASE/NIRSCHEL PROCEDURE; Bilateral     Comment:  Procedure: TENNIS ELBOW RELEASE/NIRSCHEL PROCEDURE;                Surgeon: Kathlynn Sharper, MD;  Location: [facility];                Service: Orthopedics;  Laterality: Bilateral; 01/03/2018: TENNIS ELBOW RELEASE/NIRSCHEL PROCEDURE; Right     Comment:  Procedure: TENNIS ELBOW RELEASE/NIRSCHEL PROCEDURE;                Surgeon: Kathlynn Sharper, MD;  Location: [facility];                Service: Orthopedics;  Laterality: Right; No date: TONSILLECTOMY  BMI    Body Mass Index: 28.85 kg/m      Reproductive/Obstetrics negative OB ROS                              Anesthesia Physical Anesthesia Plan  ASA: 2  Anesthesia Plan: General   Post-op Pain Management:    Induction: Intravenous  PONV Risk Score and Plan: 2 and Treatment may vary due to age or medical condition, Propofol  infusion and TIVA  Airway Management Planned: Natural Airway and Nasal Cannula  Additional Equipment:   Intra-op Plan:   Post-operative Plan:   Informed Consent: I have reviewed the patients History and Physical, chart, labs and discussed the procedure including the risks, benefits and alternatives for the proposed anesthesia with the patient or authorized representative who has indicated his/her understanding and acceptance.     Dental Advisory Given  Plan Discussed with: Anesthesiologist, CRNA and Surgeon  Anesthesia Plan Comments:  (Patient consented for risks of anesthesia including but not limited to:  - adverse reactions to medications - damage to eyes, teeth, lips or other oral mucosa - nerve damage due to positioning  - sore throat or hoarseness - Damage to heart, brain, nerves, lungs, other parts of body or loss of life  Patient voiced understanding.)         Anesthesia Quick Evaluation

## 2024-01-16 ENCOUNTER — Telehealth: Payer: Self-pay

## 2024-01-16 ENCOUNTER — Telehealth: Payer: Self-pay | Admitting: Gastroenterology

## 2024-01-16 LAB — SURGICAL PATHOLOGY

## 2024-01-16 MED ORDER — BUDESONIDE 3 MG PO CPEP: 9.0000 mg | ORAL_CAPSULE | Freq: Every day | ORAL | 0 refills | Status: DC

## 2024-01-16 NOTE — Telephone Encounter (Signed)
 Let the patient now his biopsies came back with something that is called lymphocytic colitis and he needs to be started on Budesonide  9mg  a day. He should take this for 8 weeks.   Pt is aware, Rx has been sent to [facility] and my direct extension number was given to pt

## 2024-01-16 NOTE — Telephone Encounter (Signed)
 Inbound call from patient stating he was in the ED on 12/2 due to diarrhea. States on his discharge instruction sit states to have a GI follow up within 7 days. Advised patient there are not available appointments before january. Patient requesting to speak with nurse. Please advise, thank you

## 2024-01-16 NOTE — Telephone Encounter (Signed)
 Spoke with the pt;  appt made for 02/28/24 with GM (pt preference not to see extender) He has been added to the waitlist and advised to follow instructions per Dr Jinny (hosp GI doc)

## 2024-01-19 NOTE — Telephone Encounter (Signed)
 Pt is aware of results and Rx was sent to the pharmacy

## 2024-02-07 ENCOUNTER — Other Ambulatory Visit: Payer: Self-pay | Admitting: Gastroenterology

## 2024-02-11 ENCOUNTER — Other Ambulatory Visit: Payer: Self-pay | Admitting: Gastroenterology

## 2024-02-28 ENCOUNTER — Encounter: Payer: Self-pay | Admitting: Gastroenterology

## 2024-02-28 ENCOUNTER — Ambulatory Visit: Admitting: Gastroenterology

## 2024-02-28 VITALS — BP 110/74 | HR 56 | Ht 74.0 in | Wt 224.4 lb

## 2024-02-28 DIAGNOSIS — K52832 Lymphocytic colitis: Secondary | ICD-10-CM | POA: Diagnosis not present

## 2024-02-28 MED ORDER — BUDESONIDE 3 MG PO CPEP: ORAL_CAPSULE | ORAL | 0 refills | Status: AC

## 2024-02-28 NOTE — Patient Instructions (Signed)
 We have sent the following medications to your pharmacy for you to pick up at your convenience: Budesonide  ( taper as directed.)   _______________________________________________________  If your blood pressure at your visit was 140/90 or greater, please contact your primary care physician to follow up on this.  _______________________________________________________  If you are age 67 or older, your body mass index should be between 23-30. Your Body mass index is 28.81 kg/m. If this is out of the aforementioned range listed, please consider follow up with your Primary Care Provider.  If you are age 17 or younger, your body mass index should be between 19-25. Your Body mass index is 28.81 kg/m. If this is out of the aformentioned range listed, please consider follow up with your Primary Care Provider.   ________________________________________________________  The [facility] providers would like to encourage you to use MYCHART to communicate with providers for non-urgent requests or questions.  Due to long hold times on the telephone, sending your provider a message by [facility] may be a faster and more efficient way to get a response.  Please allow 48 business hours for a response.  Please remember that this is for non-urgent requests.  _______________________________________________________  [facility] is using a team-based approach to care.  Your team is made up of your doctor and two to three APPS. Our APPS (Nurse Practitioners and Physician Assistants) work with your physician to ensure care continuity for you. They are fully qualified to address your health concerns and develop a treatment plan. They communicate directly with your gastroenterologist to care for you. Seeing the Advanced Practice Practitioners on your physician's team can help you by facilitating care more promptly, often allowing for earlier appointments, access to diagnostic testing, procedures, and other  specialty referrals.   Thank you for choosing me and [facility].  Dr. Wilhelmenia

## 2024-02-29 ENCOUNTER — Encounter: Payer: Self-pay | Admitting: Gastroenterology

## 2024-02-29 NOTE — Progress Notes (Signed)
 "  GASTROENTEROLOGY OUTPATIENT CLINIC VISIT   Primary Care Provider [name], MD [address] [place] [address] [phone]   Patient Profile: Jonathan Andrews. is a 69 y.o. male with a pmh significant for nephrolithiasis, bladder stones, hypertension, hemorrhoids (status post hemorrhoidectomy), colon polyps (TA's and advanced TA's).  The patient presents to the [facility] for an evaluation and management of problem(s) noted below:  Problem List 1. Lymphocytic colitis    Discussed the use of AI scribe software for clinical note transcription with the patient, who gave verbal consent to proceed.  History of Present Illness Please see prior GI notes for full details of HPI.  Interval History Jonathan Andrews. is a 69 year old male with recently diagnosed Lymphocytic colitis who presents for follow-up.  Patient in November began to experience issues with diarrhea.  Ended up being diagnosed with possible diverticulitis but had persisting symptoms.  He had 2 hospitalizations occur at [facility].  Eventually had colonoscopy that showed evidence of lymphocytic colitis.  He had been on Olmesartan for over a year, but this has since been stopped due to concern that it could be contributing to symptoms.  Since starting budesonide , bowel habits have significantly improved, with one normal bowel movement per day, generally in the morning.  No other antidiarrheals have been needed.  He has been following a fruit and vegetable diet and avoiding caffeine and dairy. Recently returned from an international flight.  No blood per rectum has been noted or urgency or incontinence.   GI Review of Systems Positive as above Negative for pyrosis, dysphagia, odynophagia, nausea, vomiting, abdominal pain, melena  Review of Systems General: Denies fevers/chills/weight loss unintentionally Cardiovascular: Denies chest pain/palpitations Pulmonary: Denies  shortness of breath Gastroenterological: See HPI Genitourinary: Denies darkened urine  Hematological: Denies easy bruising/bleeding Dermatological: Denies jaundice Psychological: Mood is stable   Medications Current Outpatient Medications  Medication Sig Dispense Refill   meclizine (ANTIVERT) 25 MG tablet Take 25 mg by mouth 3 (three) times daily as needed (vertigo).     ondansetron  (ZOFRAN -ODT) 4 MG disintegrating tablet Take 4 mg by mouth every 8 (eight) hours as needed for refractory nausea / vomiting.     acidophilus (RISAQUAD) CAPS capsule Take 1 capsule by mouth 2 (two) times daily. (Patient not taking: Reported on 02/28/2024) 60 capsule 0   bisoprolol-hydrochlorothiazide (ZIAC) 2.5-6.25 MG tablet Take 1 tablet by mouth daily. (Patient not taking: Reported on 02/28/2024)     budesonide  (ENTOCORT EC ) 3 MG 24 hr capsule Take 2 capsules (6 mg total) by mouth daily for 21 days, THEN 1 capsule (3 mg total) daily for 21 days. 63 capsule 0   tamsulosin  (FLOMAX ) 0.4 MG CAPS capsule Daily as needed to facilitate kidney stone passage (Patient not taking: Reported on 02/28/2024) 30 capsule 0   No current facility-administered medications for this visit.    Allergies Allergies  Allergen Reactions   Penicillins Hives    Has patient had a PCN reaction causing immediate rash, facial/tongue/throat swelling, SOB or lightheadedness with hypotension: No Has patient had a PCN reaction causing severe rash involving mucus membranes or skin necrosis: No Has patient had a PCN reaction that required hospitalization: No Has patient had a PCN reaction occurring within the last 10 years: Unknown If all of the above answers are NO, then may proceed with Cephalosporin use.    Contrast Media [Iodinated Contrast Media] Other (See Comments)    migraine    Hydrocodone -Acetaminophen  Itching  No rash    Histories Past Medical History:  Diagnosis Date   Allergy    Bladder stones    GERD (gastroesophageal  reflux disease)    Hemorrhoids    History of kidney stones    Hypertension    Past Surgical History:  Procedure Laterality Date   BIOPSY OF SKIN SUBCUTANEOUS TISSUE AND/OR MUCOUS MEMBRANE  05/19/2023   Procedure: BIOPSY, SKIN, SUBCUTANEOUS TISSUE, OR MUCOUS MEMBRANE;  Surgeon: Wilhelmenia Aloha Raddle., MD;  Location: [facility];  Service: Gastroenterology;;   COLONOSCOPY N/A 08/15/2020   Procedure: COLONOSCOPY;  Surgeon: Legrand Victory LITTIE DOUGLAS, MD;  [facility];  Service: Gastroenterology;  Laterality: N/A;   COLONOSCOPY N/A 05/19/2023   Procedure: COLONOSCOPY;  Surgeon: Wilhelmenia Aloha Raddle., MD;  Location: [facility];  Service: Gastroenterology;  Laterality: N/A;   COLONOSCOPY N/A 01/13/2024   Procedure: COLONOSCOPY;  Surgeon: Jinny Carmine, MD;  Location: [facility];  Service: Endoscopy;  Laterality: N/A;   COLONOSCOPY WITH PROPOFOL  N/A 10/27/2020   Procedure: COLONOSCOPY WITH PROPOFOL ;  Surgeon: Mansouraty, Aloha Raddle., MD;  Location: [facility];  Service: Gastroenterology;  Laterality: N/A;   CYSTOSCOPY WITH LITHOLAPAXY N/A 03/27/2019   Procedure: CYSTOSCOPY WITH LITHOLAPAXY;  Surgeon: Twylla Glendia BROCKS, MD;  Location: [facility];  Service: Urology;  Laterality: N/A;   ENDOSCOPIC MUCOSAL RESECTION N/A 10/27/2020   Procedure: ENDOSCOPIC MUCOSAL RESECTION;  Surgeon: Wilhelmenia Aloha Raddle., MD;  Location: [facility];  Service: Gastroenterology;  Laterality: N/A;   ESOPHAGOGASTRODUODENOSCOPY N/A 01/13/2024   Procedure: EGD (ESOPHAGOGASTRODUODENOSCOPY);  Surgeon: Jinny Carmine, MD;  Location: [facility] END[facility]COPY;  Service: Endoscopy;  Laterality: N/A;   EUS N/A 05/19/2023   Procedure: ULTRASOUND, UPPER GI TRACT, ENDOSCOPIC;  Surgeon: Wilhelmenia Aloha Raddle., MD;  Location: [facility];  Service: Gastroenterology;  Laterality: N/A;   EVALUATION UNDER ANESTHESIA WITH HEMORRHOIDECTOMY N/A 04/17/2020   Procedure: HEMORRHOIDECTOMY;  Surgeon: Dasie Leonor LITTIE, MD;  Location: [facility] OR;  Service:  General;  Laterality: N/A;   EXTRACORPOREAL SHOCK WAVE LITHOTRIPSY Left 08/02/2019   Procedure: EXTRACORPOREAL SHOCK WAVE LITHOTRIPSY (ESWL);  Surgeon: Twylla Glendia BROCKS, MD;  Location: [facility];  Service: Urology;  Laterality: Left;   HAND SURGERY     HAND SURGERY Bilateral    HEMOSTASIS CLIP PLACEMENT  10/27/2020   Procedure: HEMOSTASIS CLIP PLACEMENT;  Surgeon: Wilhelmenia Aloha Raddle., MD;  Location: [facility];  Service: Gastroenterology;;   POLYPECTOMY  05/19/2023   Procedure: POLYPECTOMY, INTESTINE;  Surgeon: Wilhelmenia Aloha Raddle., MD;  Location: [facility];  Service: Gastroenterology;;   RECTAL EXAM UNDER ANESTHESIA  04/17/2020   Procedure: RECTAL EXAM UNDER ANESTHESIA;  Surgeon: Dasie Leonor LITTIE, MD;  Location: [facility] OR;  Service: General;;   SUBMUCOSAL LIFTING INJECTION  10/27/2020   Procedure: SUBMUCOSAL LIFTING INJECTION;  Surgeon: Wilhelmenia Aloha Raddle., MD;  Location: [facility];  Service: Gastroenterology;;   TENNIS ELBOW RELEASE/NIRSCHEL PROCEDURE Bilateral 04/14/2017   Procedure: TENNIS ELBOW RELEASE/NIRSCHEL PROCEDURE;  Surgeon: Kathlynn Sharper, MD;  Location: [facility];  Service: Orthopedics;  Laterality: Bilateral;   TENNIS ELBOW RELEASE/NIRSCHEL PROCEDURE Right 01/03/2018   Procedure: TENNIS ELBOW RELEASE/NIRSCHEL PROCEDURE;  Surgeon: Kathlynn Sharper, MD;  Location: [facility];  Service: Orthopedics;  Laterality: Right;   TONSILLECTOMY     UMBILICAL HERNIA REPAIR  08/22/2020   Procedure: HERNIA REPAIR UMBILICAL ADULT WITH MESH;  Surgeon: Tye Millet, DO;  Location: [facility];  Service: General;;   Social History   Socioeconomic History   Marital status: Married    Spouse name: Not on file   Number of children:  Not on file   Years of education: Not on file   Highest education level: Not on file  Occupational History   Not on file  Tobacco Use   Smoking status: Former    Current packs/day: 0.00    Average packs/day: 2.0 packs/day for 12.0 years (24.0 ttl pk-yrs)    Types:  Cigarettes    Start date: 03/24/1974    Quit date: 03/24/1986    Years since quitting: 37.9   Smokeless tobacco: Never  Vaping Use   Vaping status: Never Used  Substance and Sexual Activity   Alcohol use: Yes    Alcohol/week: 0.0 standard drinks of alcohol    Comment: rarely   Drug use: No   Sexual activity: Not on file  Other Topics Concern   Not on file  Social History Narrative   Not on file   Social Drivers of Health   Tobacco Use: Medium Risk (02/29/2024)   Patient History    Smoking Tobacco Use: Former    Smokeless Tobacco Use: Never    Passive Exposure: Not on file  Financial Resource Strain: Low Risk  (09/28/2023)   Received from [facility]   Overall Financial Resource Strain (CARDIA)    Difficulty of Paying Living Expenses: Not hard at all  Food Insecurity: No Food Insecurity (01/10/2024)   Epic    Worried About Radiation Protection Practitioner of Food in the Last Year: Never true    Ran Out of Food in the Last Year: Never true  Transportation Needs: No Transportation Needs (01/10/2024)   Epic    Lack of Transportation (Medical): No    Lack of Transportation (Non-Medical): No  Physical Activity: Not on file  Stress: Not on file  Social Connections: Socially Integrated (01/10/2024)   Social Connection and Isolation Panel    Frequency of Communication with Friends and Family: Never    Frequency of Social Gatherings with Friends and Family: More than three times a week    Attends Religious Services: More than 4 times per year    Active Member of [employer] or Organizations: Yes    Attends Banker Meetings: More than 4 times per year    Marital Status: Married  Catering Manager Violence: Not At Risk (01/10/2024)   Epic    Fear of Current or Ex-Partner: No    Emotionally Abused: No    Physically Abused: No    Sexually Abused: No  Depression (PHQ2-9): Not on file  Alcohol Screen: Not on file  Housing: Low Risk (01/10/2024)   Epic    Unable to Pay for Housing  in the Last Year: No    Number of Times Moved in the Last Year: 0    Homeless in the Last Year: No  Utilities: Not At Risk (01/10/2024)   Epic    Threatened with loss of utilities: No  Health Literacy: Not on file   Family History  Problem Relation Age of Onset   Prostate cancer Father    Bladder Cancer Neg Hx    Kidney cancer Neg Hx    Colon cancer Neg Hx    Esophageal cancer Neg Hx    Inflammatory bowel disease Neg Hx    Liver disease Neg Hx    Rectal cancer Neg Hx    Stomach cancer Neg Hx    Pancreatic cancer Neg Hx    I have reviewed his medical, social, and family history in detail and updated the electronic medical record as necessary.  PHYSICAL EXAMINATION  BP 110/74 (BP Location: Left Arm, Patient Position: Sitting, Cuff Size: Large)   Pulse (!) 56   Ht 6' 2 (1.88 m) Comment: height measured without shoes  Wt 224 lb 6 oz (101.8 kg)   BMI 28.81 kg/m  Wt Readings from Last 3 Encounters:  02/28/24 224 lb 6 oz (101.8 kg)  01/10/24 237 lb (107.5 kg)  12/15/23 253 lb 15.5 oz (115.2 kg)  GEN: NAD, appears stated age, doesn't appear chronically ill PSYCH: Cooperative, without pressured speech EYE: Conjunctivae pink, sclerae anicteric ENT: Masked CV: Nontachycardic RESP: No audible wheezing GI: NABS, soft, NT/ND, without rebound or guarding GU: Offered to patient but deferred as he will be having follow-up with surgery next week MSK/EXT: No lower extremity edema SKIN: No jaundice NEURO:  Alert & Oriented x 3, no focal deficits   REVIEW OF DATA  I reviewed the following data at the time of this encounter:  GI Procedures and Studies  December 2025 outside colonoscopy - The examined portion of the ileum was normal. Biopsied. - Stool in the entire examined colon. - Random biopsies were obtained.  2025 December outside endoscopy - Partially obstructing esophageal lesion was found in the upper third of the esophagus. - Normal stomach. - Normal examined duodenum.  Biopsied  Pathology FINAL DIAGNOSIS      1. Small Bowel, cbx :      EROSION WITH MILD ASSOCIATED ACTIVE INFLAMMATION AND MILD VILLOUS BLUNTING.      SEE NOTE.       2. Terminal ileum, biopsy, cbx :      FOCAL EROSION WITH REACTIVE LYMPHOID AGGREGATES.      SEE NOTE.       3. Colon Random, cbx :      COLITIS CONSISTENT WITH LYMPHOCYTIC COLITIS.      SEE NOTE.       Diagnosis Note : & 2.The pattern is nonspecific and the differential includes      NSAIDs.Duodenal and terminal ileal involvement by Crohn's cannot be definitively      excluded.      Diagnosis Note : The colon biopsies show increased infiltrate in the lamina      propria associated with increased intraepithelial lymphocytes consistent with      lymphocytic colitis.   Laboratory Studies  Reviewed those in epic  Imaging Studies  No relevant studies to review   ASSESSMENT/PLAN  Mr. Causer is a 69 y.o. male.  The patient is seen today for evaluation and management of:  1. Lymphocytic colitis    The patient is clinically and hemodynamically stable.     Lymphocytic colitis Confirmed by biopsy.  Etiology remains uncertain, with it being idiopathic or if this could be possibly related to [facility] colopathy.  Risk of future flares persists, but long-term corticosteroid use is avoided due to adverse effects. - Continued budesonide  taper: 9 mg daily through end of week, then on Sunday start 6 mg daily for three weeks, then 3 mg daily for three weeks, then discontinue. - Provided instructions to gradually reintroduce caffeine and dairy, beginning with black coffee and monitoring for recurrence of symptoms. - Advised dietary advancement as tolerated. - Instructed to notify clinic via MyChart or phone if symptoms recur during taper or after discontinuation. - Scheduled follow-up with nurse practitioner or provider in approximately 2.5 months. - Discussed that repeat colonoscopy may not be needed in future unless  symptoms recur or do not respond to repeat steroid (after infectious workup is redone).  No orders of the defined types were placed in this encounter.   New Prescriptions   No medications on file   Modified Medications   Modified Medication Previous Medication   BUDESONIDE  (ENTOCORT EC ) 3 MG 24 HR CAPSULE budesonide  (ENTOCORT EC ) 3 MG 24 hr capsule      Take 2 capsules (6 mg total) by mouth daily for 21 days, THEN 1 capsule (3 mg total) daily for 21 days.    Take 3 capsules (9 mg total) by mouth daily.    Planned Follow Up No follow-ups on file.   Total Time in Face-to-Face and in Coordination of Care for patient including independent/personal interpretation/review of prior testing, medical history, examination, medication adjustment, communicating results with the patient directly, and documentation with the EHR is 30 minutes.   Aloha Finner, MD [facility] Advanced Endoscopy Office # [phone] "
